# Patient Record
Sex: Female | Born: 1945
Health system: Southern US, Community
[De-identification: ages and names within clinical notes are randomized; demographics above are authoritative.]

## PROBLEM LIST (undated history)

## (undated) DIAGNOSIS — R011 Cardiac murmur, unspecified: Secondary | ICD-10-CM

## (undated) DIAGNOSIS — K635 Polyp of colon: Secondary | ICD-10-CM

## (undated) DIAGNOSIS — I10 Essential (primary) hypertension: Secondary | ICD-10-CM

## (undated) DIAGNOSIS — K5792 Diverticulitis of intestine, part unspecified, without perforation or abscess without bleeding: Secondary | ICD-10-CM

## (undated) DIAGNOSIS — J189 Pneumonia, unspecified organism: Secondary | ICD-10-CM

## (undated) DIAGNOSIS — K5790 Diverticulosis of intestine, part unspecified, without perforation or abscess without bleeding: Secondary | ICD-10-CM

## (undated) DIAGNOSIS — M1990 Unspecified osteoarthritis, unspecified site: Secondary | ICD-10-CM

## (undated) DIAGNOSIS — R7303 Prediabetes: Secondary | ICD-10-CM

## (undated) DIAGNOSIS — M8580 Other specified disorders of bone density and structure, unspecified site: Secondary | ICD-10-CM

## (undated) HISTORY — DX: Other specified disorders of bone density and structure, unspecified site: M85.80

## (undated) HISTORY — DX: Polyp of colon: K63.5

## (undated) HISTORY — DX: Cardiac murmur, unspecified: R01.1

## (undated) HISTORY — PX: COLONOSCOPY: SHX174

## (undated) HISTORY — DX: Prediabetes: R73.03

## (undated) HISTORY — DX: Essential (primary) hypertension: I10

## (undated) HISTORY — DX: Pneumonia, unspecified organism: J18.9

## (undated) HISTORY — DX: Unspecified osteoarthritis, unspecified site: M19.90

## (undated) HISTORY — DX: Diverticulitis of intestine, part unspecified, without perforation or abscess without bleeding: K57.92

## (undated) HISTORY — DX: Diverticulosis of intestine, part unspecified, without perforation or abscess without bleeding: K57.90

---

## 1985-03-22 HISTORY — PX: BREAST ENHANCEMENT SURGERY: SHX7

## 1997-08-12 ENCOUNTER — Ambulatory Visit (HOSPITAL_COMMUNITY): Admission: RE | Admit: 1997-08-12 | Discharge: 1997-08-12 | Payer: Self-pay | Admitting: *Deleted

## 1998-09-03 ENCOUNTER — Other Ambulatory Visit: Admission: RE | Admit: 1998-09-03 | Discharge: 1998-09-03 | Payer: Self-pay | Admitting: *Deleted

## 1999-11-13 ENCOUNTER — Other Ambulatory Visit: Admission: RE | Admit: 1999-11-13 | Discharge: 1999-11-13 | Payer: Self-pay | Admitting: *Deleted

## 2000-02-18 ENCOUNTER — Encounter: Admission: RE | Admit: 2000-02-18 | Discharge: 2000-02-18 | Payer: Self-pay | Admitting: *Deleted

## 2000-03-09 ENCOUNTER — Ambulatory Visit (HOSPITAL_COMMUNITY): Admission: RE | Admit: 2000-03-09 | Discharge: 2000-03-09 | Payer: Self-pay | Admitting: *Deleted

## 2000-12-08 ENCOUNTER — Other Ambulatory Visit: Admission: RE | Admit: 2000-12-08 | Discharge: 2000-12-08 | Payer: Self-pay | Admitting: Internal Medicine

## 2001-02-28 ENCOUNTER — Encounter: Payer: Self-pay | Admitting: Internal Medicine

## 2001-02-28 ENCOUNTER — Encounter: Admission: RE | Admit: 2001-02-28 | Discharge: 2001-02-28 | Payer: Self-pay | Admitting: Internal Medicine

## 2002-02-22 ENCOUNTER — Encounter: Admission: RE | Admit: 2002-02-22 | Discharge: 2002-02-22 | Payer: Self-pay | Admitting: Internal Medicine

## 2002-02-22 ENCOUNTER — Encounter: Payer: Self-pay | Admitting: Internal Medicine

## 2002-12-20 ENCOUNTER — Other Ambulatory Visit: Admission: RE | Admit: 2002-12-20 | Discharge: 2002-12-20 | Payer: Self-pay | Admitting: Internal Medicine

## 2003-01-21 ENCOUNTER — Other Ambulatory Visit: Admission: RE | Admit: 2003-01-21 | Discharge: 2003-01-21 | Payer: Self-pay | Admitting: Obstetrics and Gynecology

## 2003-02-27 ENCOUNTER — Encounter: Admission: RE | Admit: 2003-02-27 | Discharge: 2003-02-27 | Payer: Self-pay | Admitting: Internal Medicine

## 2003-12-25 ENCOUNTER — Other Ambulatory Visit: Admission: RE | Admit: 2003-12-25 | Discharge: 2003-12-25 | Payer: Self-pay | Admitting: Internal Medicine

## 2004-03-03 ENCOUNTER — Encounter: Admission: RE | Admit: 2004-03-03 | Discharge: 2004-03-03 | Payer: Self-pay | Admitting: Internal Medicine

## 2004-12-24 ENCOUNTER — Other Ambulatory Visit: Admission: RE | Admit: 2004-12-24 | Discharge: 2004-12-24 | Payer: Self-pay | Admitting: Internal Medicine

## 2005-03-31 ENCOUNTER — Encounter: Admission: RE | Admit: 2005-03-31 | Discharge: 2005-03-31 | Payer: Self-pay | Admitting: Internal Medicine

## 2006-04-07 ENCOUNTER — Encounter: Admission: RE | Admit: 2006-04-07 | Discharge: 2006-04-07 | Payer: Self-pay | Admitting: Internal Medicine

## 2007-01-04 ENCOUNTER — Other Ambulatory Visit: Admission: RE | Admit: 2007-01-04 | Discharge: 2007-01-04 | Payer: Self-pay | Admitting: Internal Medicine

## 2007-04-12 ENCOUNTER — Encounter: Admission: RE | Admit: 2007-04-12 | Discharge: 2007-04-12 | Payer: Self-pay | Admitting: Internal Medicine

## 2008-05-01 ENCOUNTER — Encounter: Admission: RE | Admit: 2008-05-01 | Discharge: 2008-05-01 | Payer: Self-pay | Admitting: Internal Medicine

## 2008-07-25 ENCOUNTER — Ambulatory Visit (HOSPITAL_COMMUNITY): Admission: RE | Admit: 2008-07-25 | Discharge: 2008-07-25 | Payer: Self-pay | Admitting: Internal Medicine

## 2008-11-18 ENCOUNTER — Ambulatory Visit: Payer: Self-pay | Admitting: Chiropractic Medicine

## 2009-02-10 ENCOUNTER — Encounter (INDEPENDENT_AMBULATORY_CARE_PROVIDER_SITE_OTHER): Payer: Self-pay | Admitting: *Deleted

## 2009-02-10 ENCOUNTER — Other Ambulatory Visit: Admission: RE | Admit: 2009-02-10 | Discharge: 2009-02-10 | Payer: Self-pay | Admitting: Internal Medicine

## 2009-02-25 ENCOUNTER — Encounter (INDEPENDENT_AMBULATORY_CARE_PROVIDER_SITE_OTHER): Payer: Self-pay | Admitting: *Deleted

## 2009-02-26 ENCOUNTER — Ambulatory Visit: Payer: Self-pay | Admitting: Gastroenterology

## 2009-03-12 ENCOUNTER — Ambulatory Visit: Payer: Self-pay | Admitting: Gastroenterology

## 2009-03-18 ENCOUNTER — Encounter: Payer: Self-pay | Admitting: Gastroenterology

## 2009-05-20 ENCOUNTER — Encounter: Admission: RE | Admit: 2009-05-20 | Discharge: 2009-05-20 | Payer: Self-pay | Admitting: Internal Medicine

## 2010-05-04 ENCOUNTER — Other Ambulatory Visit: Payer: Self-pay | Admitting: Internal Medicine

## 2010-05-04 DIAGNOSIS — Z1231 Encounter for screening mammogram for malignant neoplasm of breast: Secondary | ICD-10-CM

## 2010-05-25 ENCOUNTER — Ambulatory Visit: Payer: Self-pay

## 2010-05-26 ENCOUNTER — Ambulatory Visit
Admission: RE | Admit: 2010-05-26 | Discharge: 2010-05-26 | Disposition: A | Discharge status: Home / Self Care | Payer: Managed Care, Other (non HMO) | Source: Ambulatory Visit | Attending: Internal Medicine | Admitting: Internal Medicine

## 2010-05-26 DIAGNOSIS — Z1231 Encounter for screening mammogram for malignant neoplasm of breast: Secondary | ICD-10-CM

## 2010-11-25 ENCOUNTER — Telehealth: Payer: Self-pay | Admitting: *Deleted

## 2010-11-25 DIAGNOSIS — Z0000 Encounter for general adult medical examination without abnormal findings: Secondary | ICD-10-CM

## 2010-11-25 NOTE — Telephone Encounter (Signed)
Received staff msg pt is schedule for CPX 02/26/11 need labs entered. Entered labs in EPIC...11/25/10@4 :33pm/LMB

## 2011-02-23 ENCOUNTER — Other Ambulatory Visit (INDEPENDENT_AMBULATORY_CARE_PROVIDER_SITE_OTHER): Payer: Managed Care, Other (non HMO)

## 2011-02-23 DIAGNOSIS — I10 Essential (primary) hypertension: Secondary | ICD-10-CM

## 2011-02-23 DIAGNOSIS — Z0000 Encounter for general adult medical examination without abnormal findings: Secondary | ICD-10-CM

## 2011-02-23 LAB — HEPATIC FUNCTION PANEL
ALT: 14 U/L (ref 0–35)
AST: 16 U/L (ref 0–37)
Albumin: 3.9 g/dL (ref 3.5–5.2)
Alkaline Phosphatase: 67 U/L (ref 39–117)
Bilirubin, Direct: 0.1 mg/dL (ref 0.0–0.3)
Total Bilirubin: 0.5 mg/dL (ref 0.3–1.2)
Total Protein: 7.2 g/dL (ref 6.0–8.3)

## 2011-02-23 LAB — LIPID PANEL
HDL: 77.1 mg/dL (ref >39.00)
Total CHOL/HDL Ratio: 3
VLDL: 12.6 mg/dL (ref 0.0–40.0)

## 2011-02-23 LAB — TSH: TSH: 0.74 u[IU]/mL (ref 0.35–5.50)

## 2011-02-23 LAB — CBC WITH DIFFERENTIAL/PLATELET
Basophils Relative: 0.6 % (ref 0.0–3.0)
Eosinophils Absolute: 0.1 10*3/uL (ref 0.0–0.7)
Hemoglobin: 13.3 g/dL (ref 12.0–15.0)
Lymphocytes Relative: 42.1 % (ref 12.0–46.0)
MCHC: 34.1 g/dL (ref 30.0–36.0)
MCV: 92.5 fl (ref 78.0–100.0)
Monocytes Absolute: 0.8 10*3/uL (ref 0.1–1.0)
Neutro Abs: 3.2 10*3/uL (ref 1.4–7.7)
RBC: 4.22 Mil/uL (ref 3.87–5.11)

## 2011-02-23 LAB — BASIC METABOLIC PANEL
CO2: 28 mEq/L (ref 19–32)
Chloride: 105 mEq/L (ref 96–112)
Sodium: 139 mEq/L (ref 135–145)

## 2011-02-26 ENCOUNTER — Other Ambulatory Visit: Payer: Managed Care, Other (non HMO)

## 2011-02-26 ENCOUNTER — Ambulatory Visit (INDEPENDENT_AMBULATORY_CARE_PROVIDER_SITE_OTHER): Payer: Medicare Other | Admitting: Internal Medicine

## 2011-02-26 ENCOUNTER — Encounter: Payer: Self-pay | Admitting: Internal Medicine

## 2011-02-26 VITALS — BP 130/82 | HR 66 | Temp 98.4°F | Ht 67.0 in | Wt 156.2 lb

## 2011-02-26 DIAGNOSIS — Z23 Encounter for immunization: Secondary | ICD-10-CM

## 2011-02-26 DIAGNOSIS — Z0000 Encounter for general adult medical examination without abnormal findings: Secondary | ICD-10-CM

## 2011-02-26 DIAGNOSIS — Z136 Encounter for screening for cardiovascular disorders: Secondary | ICD-10-CM

## 2011-02-26 DIAGNOSIS — I10 Essential (primary) hypertension: Secondary | ICD-10-CM

## 2011-02-26 DIAGNOSIS — Z124 Encounter for screening for malignant neoplasm of cervix: Secondary | ICD-10-CM

## 2011-02-26 DIAGNOSIS — Z13820 Encounter for screening for osteoporosis: Secondary | ICD-10-CM

## 2011-02-26 DIAGNOSIS — K635 Polyp of colon: Secondary | ICD-10-CM | POA: Insufficient documentation

## 2011-02-26 DIAGNOSIS — M1990 Unspecified osteoarthritis, unspecified site: Secondary | ICD-10-CM

## 2011-02-26 LAB — URINALYSIS, ROUTINE W REFLEX MICROSCOPIC
Ketones, ur: NEGATIVE
Specific Gravity, Urine: 1.02 (ref 1.000–1.030)
Total Protein, Urine: NEGATIVE
Urine Glucose: NEGATIVE
Urobilinogen, UA: 0.2 (ref 0.0–1.0)

## 2011-02-26 NOTE — Assessment & Plan Note (Signed)
BP Readings from Last 3 Encounters:  02/26/11 130/82   The current medical regimen is effective;  continue present plan and medications.

## 2011-02-26 NOTE — Assessment & Plan Note (Signed)
Right knee osteoarthritis, has seen orthopedics for same in past Uses Voltaren 1-2 times weekly as needed for acute flare Symptoms have been stable over past couple years  The current medical regimen is effective;  continue present plan and medications.

## 2011-02-26 NOTE — Progress Notes (Signed)
Subjective:    Patient ID: Sherry Mitchell, female    DOB: 1945-12-24, 65 y.o.   MRN: [id number]  HPI New patient to me and our practice, here today to establish care  Also here for medicare wellness  Diet: heart healthy  Physical activity: Reasonably active with ADLs, no scheduled aerobic exercise Depression/mood screen: negative Hearing: intact to whispered voice Visual acuity: grossly normal, performs annual eye exam  ADLs: capable Fall risk: none Home safety: good Cognitive evaluation: intact to orientation, naming, recall and repetition EOL planning: adv directives, full code/ I agree  I have personally reviewed and have noted 1. The patient's medical and social history 2. Their use of alcohol, tobacco or illicit drugs 3. Their current medications and supplements 4. The patient's functional ability including ADL's, fall risks, home safety risks and hearing or visual impairment. 5. Diet and physical activities 6. Evidence for depression or mood disorders  also reviewed chronic medical issues:  Hypertension -the patient reports compliance with medication(s) as prescribed. Denies adverse side effects.  Osteoarthritis - primarily affects right knee. Uses Voltaren tablet NSAIDs as needed for same, average 1-2 times per week -no swelling or acute flares. Has seen Dr. Charlett Blake orthopedics in the past for same  Past Medical History  Diagnosis Date  . Osteoarthritis   . Hypertension   . Colon polyps 02/2003, 02/2009    Adenomatous polyps   Family History  Problem Relation Age of Onset  . Cancer Mother     cancer of saliva glands  . Heart disease Other    History  Substance Use Topics  . Smoking status: Never Smoker   . Smokeless tobacco: Not on file  . Alcohol Use: Yes    Review of Systems Constitutional: Negative for fever or weight change.  Respiratory: Negative for cough and shortness of breath.   Cardiovascular: Negative for chest pain or palpitations.    Gastrointestinal: Negative for abdominal pain, no bowel changes.  Musculoskeletal: Negative for gait problem or joint swelling.  Skin: Negative for rash.  Neurological: Negative for dizziness or headache.  No other specific complaints in a complete review of systems (except as listed in HPI above).     Objective:   Physical Exam BP 130/82  Pulse 66  Temp(Src) 98.4 F (36.9 C) (Oral)  Ht 5\' 7"  (1.702 m)  Wt 156 lb 3.2 oz (70.852 kg)  BMI 24.46 kg/m2  SpO2 97% Wt Readings from Last 3 Encounters:  02/26/11 156 lb 3.2 oz (70.852 kg)   Constitutional: She appears well-developed and well-nourished. No distress.  HENT: Head: Normocephalic and atraumatic. Ears: B TMs ok, no erythema or effusion; Nose: Nose normal.  Mouth/Throat: Oropharynx is clear and moist. No oropharyngeal exudate.  Eyes: Conjunctivae and EOM are normal. Pupils are equal, round, and reactive to light. No scleral icterus.  Neck: Normal range of motion. Neck supple. No JVD present. No thyromegaly present.  Cardiovascular: Normal rate, regular rhythm and normal heart sounds.  No murmur heard. No BLE edema. Pulmonary/Chest: Effort normal and breath sounds normal. No respiratory distress. She has no wheezes.  Abdominal: Soft. Bowel sounds are normal. She exhibits no distension. There is no tenderness. no masses Musculoskeletal: Right knee with trace effusion, no warmth. Full range of motion nontender to palpation. Remaining joints with normal range of motion, no joint effusions. No gross deformities Neurological: She is alert and oriented to person, place, and time. No cranial nerve deficit. Coordination normal.  Skin: Skin is warm and dry. No rash noted.  No erythema.  Psychiatric: She has a normal mood and affect. Her behavior is normal. Judgment and thought content normal.   Lab Results  Component Value Date   WBC 7.1 02/23/2011   HGB 13.3 02/23/2011   HCT 39.0 02/23/2011   PLT 293.0 02/23/2011   GLUCOSE 100* 02/23/2011    CHOL 199 02/23/2011   TRIG 63.0 02/23/2011   HDL 77.10 02/23/2011   LDLCALC 109* 02/23/2011   ALT 14 02/23/2011   AST 16 02/23/2011   NA 139 02/23/2011   K 4.3 02/23/2011   CL 105 02/23/2011   CREATININE 0.7 02/23/2011   BUN 16 02/23/2011   CO2 28 02/23/2011   TSH 0.74 02/23/2011   EKG: Normal sinus rhythm at 68 beats per minute. No ischemic or arrhythmic changes     Assessment & Plan:  AWV/"CPX" - v70.0 - Today patient counseled on age appropriate routine health concerns for screening and prevention, each reviewed and up to date or declined. Immunizations reviewed and up to date or declined. Labs/ECG reviewed. Risk factors for depression reviewed and negative. Hearing function and visual acuity are intact. ADLs screened and addressed as needed. Functional ability and level of safety reviewed and appropriate. Education, counseling and referrals performed based on assessed risks today. Patient provided with a copy of personalized plan for preventive services.  Also see problem list. Medications and labs reviewed today.

## 2011-02-26 NOTE — Patient Instructions (Signed)
It was good to see you today. We have reviewed your prior records including labs and tests today Health Maintenance reviewed - tetanus booster given, flu shot given. Will refer for Pap smear and bone density scan. Other immunizations and recommendations are up-to-date  Medications reviewed, no changes at this time. Let us know when refills are needed we'll make referral to gynecologist. Our office will contact you regarding appointment(s) once made. Will schedule for bone density scan here today We will send for records from Dr. Elisabeth Most to incorporate into our electronic files Please schedule followup annually for physical and labs, call sooner if problems.

## 2011-03-01 ENCOUNTER — Ambulatory Visit (INDEPENDENT_AMBULATORY_CARE_PROVIDER_SITE_OTHER)
Admission: RE | Admit: 2011-03-01 | Discharge: 2011-03-01 | Disposition: A | Discharge status: Home / Self Care | Payer: Medicare Other | Source: Ambulatory Visit | Attending: Internal Medicine | Admitting: Internal Medicine

## 2011-03-01 DIAGNOSIS — Z13820 Encounter for screening for osteoporosis: Secondary | ICD-10-CM

## 2011-03-25 ENCOUNTER — Encounter: Payer: Self-pay | Admitting: Internal Medicine

## 2011-03-30 ENCOUNTER — Telehealth: Payer: Self-pay | Admitting: Internal Medicine

## 2011-03-30 MED ORDER — RALOXIFENE HCL 60 MG PO TABS: 60.0000 mg | ORAL_TABLET | Freq: Every day | ORAL | Status: DC

## 2011-03-30 MED ORDER — LISINOPRIL-HYDROCHLOROTHIAZIDE 20-25 MG PO TABS: 1.0000 | ORAL_TABLET | Freq: Every day | ORAL | Status: DC

## 2011-03-30 MED ORDER — DILTIAZEM HCL ER 240 MG PO CP24: 240.0000 mg | ORAL_CAPSULE | Freq: Every day | ORAL | Status: DC

## 2011-03-30 NOTE — Telephone Encounter (Signed)
Per pt need refills on lisinopril 20/25mg --diltiazem 240mg --evista 60 mg--Parm__[facility]  Fax#--[phone] Ext [phone]

## 2011-03-30 NOTE — Telephone Encounter (Signed)
Called # actually [employer]. Fax script to [facility], notified pt rx sent to mail service...03/30/11@11 :43am/LMB

## 2011-05-31 ENCOUNTER — Other Ambulatory Visit: Payer: Self-pay | Admitting: Internal Medicine

## 2011-05-31 DIAGNOSIS — Z1231 Encounter for screening mammogram for malignant neoplasm of breast: Secondary | ICD-10-CM

## 2011-06-11 ENCOUNTER — Ambulatory Visit
Admission: RE | Admit: 2011-06-11 | Discharge: 2011-06-11 | Disposition: A | Discharge status: Home / Self Care | Payer: Medicare Other | Source: Ambulatory Visit | Attending: Internal Medicine | Admitting: Internal Medicine

## 2011-06-11 DIAGNOSIS — Z1231 Encounter for screening mammogram for malignant neoplasm of breast: Secondary | ICD-10-CM

## 2011-10-06 ENCOUNTER — Other Ambulatory Visit: Payer: Self-pay | Admitting: Internal Medicine

## 2011-11-30 ENCOUNTER — Encounter: Payer: Self-pay | Admitting: Internal Medicine

## 2012-02-28 ENCOUNTER — Other Ambulatory Visit (INDEPENDENT_AMBULATORY_CARE_PROVIDER_SITE_OTHER): Payer: Medicare Other

## 2012-02-28 ENCOUNTER — Ambulatory Visit (INDEPENDENT_AMBULATORY_CARE_PROVIDER_SITE_OTHER): Payer: Medicare Other | Admitting: Internal Medicine

## 2012-02-28 ENCOUNTER — Encounter: Payer: Self-pay | Admitting: Internal Medicine

## 2012-02-28 VITALS — BP 120/72 | HR 64 | Temp 98.0°F | Ht 67.0 in | Wt 159.0 lb

## 2012-02-28 DIAGNOSIS — M899 Disorder of bone, unspecified: Secondary | ICD-10-CM

## 2012-02-28 DIAGNOSIS — M8580 Other specified disorders of bone density and structure, unspecified site: Secondary | ICD-10-CM | POA: Insufficient documentation

## 2012-02-28 DIAGNOSIS — Z0000 Encounter for general adult medical examination without abnormal findings: Secondary | ICD-10-CM

## 2012-02-28 DIAGNOSIS — M1990 Unspecified osteoarthritis, unspecified site: Secondary | ICD-10-CM

## 2012-02-28 DIAGNOSIS — M949 Disorder of cartilage, unspecified: Secondary | ICD-10-CM

## 2012-02-28 DIAGNOSIS — I10 Essential (primary) hypertension: Secondary | ICD-10-CM

## 2012-02-28 DIAGNOSIS — Z1331 Encounter for screening for depression: Secondary | ICD-10-CM

## 2012-02-28 LAB — CBC WITH DIFFERENTIAL/PLATELET
Basophils Relative: 0.6 % (ref 0.0–3.0)
Eosinophils Relative: 2.3 % (ref 0.0–5.0)
Lymphocytes Relative: 33.9 % (ref 12.0–46.0)
Neutrophils Relative %: 51.8 % (ref 43.0–77.0)
RBC: 4.5 Mil/uL (ref 3.87–5.11)
WBC: 5.7 10*3/uL (ref 4.5–10.5)

## 2012-02-28 LAB — HEPATIC FUNCTION PANEL
AST: 17 U/L (ref 0–37)
Albumin: 4.1 g/dL (ref 3.5–5.2)
Alkaline Phosphatase: 65 U/L (ref 39–117)
Total Protein: 7.4 g/dL (ref 6.0–8.3)

## 2012-02-28 LAB — LIPID PANEL
Cholesterol: 205 mg/dL — ABNORMAL HIGH (ref 0–200)
Triglycerides: 98 mg/dL (ref 0.0–149.0)
VLDL: 19.6 mg/dL (ref 0.0–40.0)

## 2012-02-28 LAB — URINALYSIS, ROUTINE W REFLEX MICROSCOPIC
Specific Gravity, Urine: 1.02 (ref 1.000–1.030)
Total Protein, Urine: NEGATIVE
Urine Glucose: NEGATIVE

## 2012-02-28 LAB — TSH: TSH: 0.32 u[IU]/mL — ABNORMAL LOW (ref 0.35–5.50)

## 2012-02-28 LAB — BASIC METABOLIC PANEL
Calcium: 9.2 mg/dL (ref 8.4–10.5)
Chloride: 100 mEq/L (ref 96–112)
Creatinine, Ser: 0.7 mg/dL (ref 0.4–1.2)

## 2012-02-28 NOTE — Progress Notes (Signed)
Subjective:    Patient ID: Sherry Mitchell, female    DOB: [birth year], 66 y.o.   MRN: [id number]  HPI  here for annual medicare wellness  Diet: heart healthy  Physical activity: Reasonably active with ADLs, no scheduled aerobic exercise Depression/mood screen: negative Hearing: intact to whispered voice Visual acuity: grossly normal, performs annual eye exam  ADLs: capable Fall risk: none Home safety: good Cognitive evaluation: intact to orientation, naming, recall and repetition EOL planning: adv directives, full code/ I agree  I have personally reviewed and have noted 1. The patient's medical and social history 2. Their use of alcohol, tobacco or illicit drugs 3. Their current medications and supplements 4. The patient's functional ability including ADL's, fall risks, home safety risks and hearing or visual impairment. 5. Diet and physical activities 6. Evidence for depression or mood disorders  also reviewed chronic medical issues:  Hypertension -the patient reports compliance with medication(s) as prescribed. Denies adverse side effects.  Osteoarthritis - primarily affects right knee. Uses Voltaren tablet NSAIDs as needed for same, average 1-2 times per week -no swelling or acute flares. Has seen Dr. Charlett Blake orthopedics in the past for same  Past Medical History  Diagnosis Date  . Osteoarthritis   . Hypertension   . Colon polyps 02/2003, 02/2009    Adenomatous polyps  . Osteopenia     DEXA 02/2011: -1.9   Family History  Problem Relation Age of Onset  . Cancer Mother     cancer of saliva glands  . Heart disease Other    History  Substance Use Topics  . Smoking status: Never Smoker   . Smokeless tobacco: Not on file  . Alcohol Use: Yes    Review of Systems  Constitutional: Negative for fever or weight change.  Respiratory: Negative for cough and shortness of breath.   Cardiovascular: Negative for chest pain or palpitations.  Gastrointestinal: Negative for  abdominal pain, no bowel changes.  Musculoskeletal: Negative for gait problem or joint swelling.  Skin: Negative for rash.  Neurological: Negative for dizziness or headache.  No other specific complaints in a complete review of systems (except as listed in HPI above).     Objective:   Physical Exam  BP 120/72  Pulse 64  Temp 98 F (36.7 C) (Oral)  Ht 5\' 7"  (1.702 m)  Wt 159 lb (72.122 kg)  BMI 24.90 kg/m2  SpO2 98% Wt Readings from Last 3 Encounters:  02/28/12 159 lb (72.122 kg)  02/26/11 156 lb 3.2 oz (70.852 kg)   Constitutional: She appears well-developed and well-nourished. No distress.  HENT: Head: Normocephalic and atraumatic. Ears: B TMs ok, no erythema or effusion; Nose: Nose normal. Mouth/Throat: Oropharynx is clear and moist. No oropharyngeal exudate.  Eyes: Conjunctivae and EOM are normal. Pupils are equal, round, and reactive to light. No scleral icterus.  Neck: Normal range of motion. Neck supple. No JVD present. No thyromegaly present.  Cardiovascular: Normal rate, regular rhythm and normal heart sounds.  No murmur heard. No BLE edema. Pulmonary/Chest: Effort normal and breath sounds normal. No respiratory distress. She has no wheezes.  Abdominal: Soft. Bowel sounds are normal. She exhibits no distension. There is no tenderness. no masses Musculoskeletal: Right knee with trace effusion, no warmth. Full range of motion nontender to palpation. Remaining joints with normal range of motion, no joint effusions. No gross deformities Neurological: She is alert and oriented to person, place, and time. No cranial nerve deficit. Coordination normal.  Skin: Skin is warm and dry. No  rash noted. No erythema.  Psychiatric: She has a normal mood and affect. Her behavior is normal. Judgment and thought content normal.   Lab Results  Component Value Date   WBC 7.1 02/23/2011   HGB 13.3 02/23/2011   HCT 39.0 02/23/2011   PLT 293.0 02/23/2011   GLUCOSE 100* 02/23/2011   CHOL 199  02/23/2011   TRIG 63.0 02/23/2011   HDL 77.10 02/23/2011   LDLCALC 109* 02/23/2011   ALT 14 02/23/2011   AST 16 02/23/2011   NA 139 02/23/2011   K 4.3 02/23/2011   CL 105 02/23/2011   CREATININE 0.7 02/23/2011   BUN 16 02/23/2011   CO2 28 02/23/2011   TSH 0.74 02/23/2011   EKG: Normal sinus rhythm at 61 beats per minute. No ischemic or arrhythmic changes - no change from 02/2011     Assessment & Plan:  AWV/"CPX" - v70.0 - Today patient counseled on age appropriate routine health concerns for screening and prevention, each reviewed and up to date or declined. Immunizations reviewed and up to date or declined. Labs/ECG reviewed. Risk factors for depression reviewed and negative. Hearing function and visual acuity are intact. ADLs screened and addressed as needed. Functional ability and level of safety reviewed and appropriate. Education, counseling and referrals performed based on assessed risks today. Patient provided with a copy of personalized plan for preventive services.  Also see problem list. Medications and labs reviewed today.

## 2012-02-28 NOTE — Assessment & Plan Note (Signed)
BP Readings from Last 3 Encounters:  02/28/12 120/72  02/26/11 130/82   The current medical regimen is effective;  continue present plan and medications.

## 2012-02-28 NOTE — Assessment & Plan Note (Signed)
Right knee osteoarthritis, has seen orthopedics for same in past On joint juice supplement with improved symptoms  Occasionally uses Voltaren 1-2 times weekly as needed for acute flare Symptoms have been stable over past couple years  The current medical regimen is effective;  continue present plan and medications.

## 2012-02-28 NOTE — Assessment & Plan Note (Signed)
Education on dx provided Continue Evista with Ca+Vit D and WB activity as ongoing

## 2012-02-28 NOTE — Patient Instructions (Signed)
It was good to see you today. We have reviewed your prior records including labs and tests today Health Maintenance reviewed - all recommended immunizations and age-appropriate screenings are up-to-date. Test(s) ordered today. Your results will be released to MyChart (or called to you) after review, usually within 72hours after test completion. If any changes need to be made, you will be notified at that same time. Medications reviewed, no changes at this time. Please schedule followup in 1 year, call sooner if problems. Health Maintenance, Females A healthy lifestyle and preventative care can promote health and wellness.  Maintain regular health, dental, and eye exams.   Eat a healthy diet. Foods like vegetables, fruits, whole grains, low-fat dairy products, and lean protein foods contain the nutrients you need without too many calories. Decrease your intake of foods high in solid fats, added sugars, and salt. Get information about a proper diet from your caregiver, if necessary.   Regular physical exercise is one of the most important things you can do for your health. Most adults should get at least 150 minutes of moderate-intensity exercise (any activity that increases your heart rate and causes you to sweat) each week. In addition, most adults need muscle-strengthening exercises on 2 or more days a week.     Maintain a healthy weight. The body mass index (BMI) is a screening tool to identify possible weight problems. It provides an estimate of body fat based on height and weight. Your caregiver can help determine your BMI, and can help you achieve or maintain a healthy weight. For adults 20 years and older:   A BMI below 18.5 is considered underweight.   A BMI of 18.5 to 24.9 is normal.   A BMI of 25 to 29.9 is considered overweight.   A BMI of 30 and above is considered obese.   Maintain normal blood lipids and cholesterol by exercising and minimizing your intake of saturated fat. Eat  a balanced diet with plenty of fruits and vegetables. Blood tests for lipids and cholesterol should begin at age 79 and be repeated every 5 years. If your lipid or cholesterol levels are high, you are over 50, or you are a high risk for heart disease, you may need your cholesterol levels checked more frequently. Ongoing high lipid and cholesterol levels should be treated with medicines if diet and exercise are not effective.   If you smoke, find out from your caregiver how to quit. If you do not use tobacco, do not start.   If you are pregnant, do not drink alcohol. If you are breastfeeding, be very cautious about drinking alcohol. If you are not pregnant and choose to drink alcohol, do not exceed 1 drink per day. One drink is considered to be 12 ounces (355 mL) of beer, 5 ounces (148 mL) of wine, or 1.5 ounces (44 mL) of liquor.   Avoid use of street drugs. Do not share needles with anyone. Ask for help if you need support or instructions about stopping the use of drugs.   High blood pressure causes heart disease and increases the risk of stroke. Blood pressure should be checked at least every 1 to 2 years. Ongoing high blood pressure should be treated with medicines, if weight loss and exercise are not effective.   If you are 75 to 66 years old, ask your caregiver if you should take aspirin to prevent strokes.   Diabetes screening involves taking a blood sample to check your fasting blood sugar level. This should  be done once every 3 years, after age 57, if you are within normal weight and without risk factors for diabetes. Testing should be considered at a younger age or be carried out more frequently if you are overweight and have at least 1 risk factor for diabetes.   Breast cancer screening is essential preventative care for women. You should practice "breast self-awareness." This means understanding the normal appearance and feel of your breasts and may include breast self-examination. Any  changes detected, no matter how small, should be reported to a caregiver. Women in their 81s and 30s should have a clinical breast exam (CBE) by a caregiver as part of a regular health exam every 1 to 3 years. After age 32, women should have a CBE every year. Starting at age 46, women should consider having a mammogram (breast X-ray) every year. Women who have a family history of breast cancer should talk to their caregiver about genetic screening. Women at a high risk of breast cancer should talk to their caregiver about having an MRI and a mammogram every year.   The Pap test is a screening test for cervical cancer. Women should have a Pap test starting at age 65. Between ages 50 and 75, Pap tests should be repeated every 2 years. Beginning at age 71, you should have a Pap test every 3 years as long as the past 3 Pap tests have been normal. If you had a hysterectomy for a problem that was not cancer or a condition that could lead to cancer, then you no longer need Pap tests. If you are between ages 40 and 71, and you have had normal Pap tests going back 10 years, you no longer need Pap tests. If you have had past treatment for cervical cancer or a condition that could lead to cancer, you need Pap tests and screening for cancer for at least 20 years after your treatment. If Pap tests have been discontinued, risk factors (such as a new sexual partner) need to be reassessed to determine if screening should be resumed. Some women have medical problems that increase the chance of getting cervical cancer. In these cases, your caregiver may recommend more frequent screening and Pap tests.   The human papillomavirus (HPV) test is an additional test that may be used for cervical cancer screening. The HPV test looks for the virus that can cause the cell changes on the cervix. The cells collected during the Pap test can be tested for HPV. The HPV test could be used to screen women aged 45 years and older, and should be  used in women of any age who have unclear Pap test results. After the age of 72, women should have HPV testing at the same frequency as a Pap test.   Colorectal cancer can be detected and often prevented. Most routine colorectal cancer screening begins at the age of 37 and continues through age 56. However, your caregiver may recommend screening at an earlier age if you have risk factors for colon cancer. On a yearly basis, your caregiver may provide home test kits to check for hidden blood in the stool. Use of a small camera at the end of a tube, to directly examine the colon (sigmoidoscopy or colonoscopy), can detect the earliest forms of colorectal cancer. Talk to your caregiver about this at age 44, when routine screening begins. Direct examination of the colon should be repeated every 5 to 10 years through age 76, unless early forms of pre-cancerous polyps  or small growths are found.   Hepatitis C blood testing is recommended for all people born from [birth year] through 1965 and any individual with known risks for hepatitis C.   Practice safe sex. Use condoms and avoid high-risk sexual practices to reduce the spread of sexually transmitted infections (STIs). Sexually active women aged 40 and younger should be checked for Chlamydia, which is a common sexually transmitted infection. Older women with new or multiple partners should also be tested for Chlamydia. Testing for other STIs is recommended if you are sexually active and at increased risk.   Osteoporosis is a disease in which the bones lose minerals and strength with aging. This can result in serious bone fractures. The risk of osteoporosis can be identified using a bone density scan. Women ages 57 and over and women at risk for fractures or osteoporosis should discuss screening with their caregivers. Ask your caregiver whether you should be taking a calcium supplement or vitamin D to reduce the rate of osteoporosis.   Menopause can be associated with  physical symptoms and risks. Hormone replacement therapy is available to decrease symptoms and risks. You should talk to your caregiver about whether hormone replacement therapy is right for you.   Use sunscreen with a sun protection factor (SPF) of 30 or greater. Apply sunscreen liberally and repeatedly throughout the day. You should seek shade when your shadow is shorter than you. Protect yourself by wearing long sleeves, pants, a wide-brimmed hat, and sunglasses year round, whenever you are outdoors.   Notify your caregiver of new moles or changes in moles, especially if there is a change in shape or color. Also notify your caregiver if a mole is larger than the size of a pencil eraser.   Stay current with your immunizations.  Document Released: 09/21/2010 Document Revised: 05/31/2011 Document Reviewed: 09/21/2010 [facility] Patient Information 2013 [place], [place].   Osteoporosis and Fractures Osteoporosis is a disease of the bones that makes them weaker and prone to break (fracture). By their mid-30s, most people begin to gradually lose bone strength. If this is severe enough, osteoporosis may occur. Bone fractures from osteoporosis (especially hip and spine fractures) are a major cause of hospitalization, loss of independence, and can lead to life-threatening complications. Your goals are to prevent falls and to prevent or treat weakening bones. ROLE OF FALLS Fractures occur in only a small percentage of older persons who fall, even though at least 30% to 40% of elderly people have osteoporosis. However, a fall of just a few feet may cause a fracture in a person with low bone density. FALL PREVENTION  If you are unsteady on your feet, use a cane, walker, or walk with someone's help.   Remove loose rugs or electrical cords from your home.   Keep your home well lit at night. Use glasses if you need them.   Avoid icy streets and wet or waxed floors.   Hold the railing when using stairs.    Watch out for your pets.   Install grab bars in your bathroom.   Exercise. Physical activity, especially weight-bearing exercise, helps strengthen bones. Strength and balance exercise, such as tai chi, helps prevent falls.   Alcohol and some medicines can make you more likely to fall. Discuss alcohol use with your caregiver. Ask your caregiver if any of your medicines might increase your risk for falling. Ask if safer alternatives are available.  HOME CARE INSTRUCTIONS    Try to prevent and avoid falls.   To  pick up objects, bend at the knees. Do not bend with your back.   Do not smoke. If you smoke, ask for help to stop.   Have adequate calcium and vitamin D in your diet. Talk with your caregiver about amounts.   Before exercising, ask your caregiver what exercises will be good for you.   Only take over-the-counter or prescription medicines for pain, discomfort, or fever as directed by your caregiver.   Some medical conditions (menopause, kidney disease, steroid treatment) can weaken your bones. Talk about bone health with your caregiver. Discuss any medicines you take and whether they can weaken your bones.   If you have osteoporosis or osteopenia, discuss treatment options with your caregiver.  SEEK MEDICAL CARE IF:    You have had a fracture and your pain is not controlled.   You have had a fracture and you are not able to return to activities as expected.   You are reinjured.   You develop side effects from medicines, especially stomach pain or trouble swallowing.   You develop new, unexplained problems.  SEEK IMMEDIATE MEDICAL CARE IF:    You develop sudden, severe pain in your back.   You develop pain after an injury or fall.  Document Released: 01/30/2004 Document Revised: 02/25/2011 Document Reviewed: 07/09/2009 [facility] Patient Information 2012 [place], [place].

## 2012-03-12 ENCOUNTER — Other Ambulatory Visit: Payer: Self-pay | Admitting: Internal Medicine

## 2012-05-24 ENCOUNTER — Other Ambulatory Visit: Payer: Self-pay

## 2012-05-24 DIAGNOSIS — Z1231 Encounter for screening mammogram for malignant neoplasm of breast: Secondary | ICD-10-CM

## 2012-06-20 ENCOUNTER — Ambulatory Visit
Admission: RE | Admit: 2012-06-20 | Discharge: 2012-06-20 | Disposition: A | Discharge status: Home / Self Care | Payer: Medicare Other | Source: Ambulatory Visit

## 2012-06-20 DIAGNOSIS — Z1231 Encounter for screening mammogram for malignant neoplasm of breast: Secondary | ICD-10-CM

## 2013-03-01 ENCOUNTER — Ambulatory Visit (INDEPENDENT_AMBULATORY_CARE_PROVIDER_SITE_OTHER): Payer: Medicare Other | Admitting: Internal Medicine

## 2013-03-01 ENCOUNTER — Encounter: Payer: Self-pay | Admitting: Internal Medicine

## 2013-03-01 ENCOUNTER — Other Ambulatory Visit (INDEPENDENT_AMBULATORY_CARE_PROVIDER_SITE_OTHER): Payer: Medicare Other

## 2013-03-01 VITALS — BP 122/82 | HR 60 | Temp 97.3°F | Ht 67.0 in | Wt 162.0 lb

## 2013-03-01 DIAGNOSIS — I10 Essential (primary) hypertension: Secondary | ICD-10-CM

## 2013-03-01 DIAGNOSIS — Z0000 Encounter for general adult medical examination without abnormal findings: Secondary | ICD-10-CM

## 2013-03-01 DIAGNOSIS — M8580 Other specified disorders of bone density and structure, unspecified site: Secondary | ICD-10-CM

## 2013-03-01 DIAGNOSIS — M899 Disorder of bone, unspecified: Secondary | ICD-10-CM

## 2013-03-01 LAB — HEPATIC FUNCTION PANEL
AST: 16 U/L (ref 0–37)
Albumin: 4.2 g/dL (ref 3.5–5.2)
Alkaline Phosphatase: 70 U/L (ref 39–117)
Total Protein: 7.5 g/dL (ref 6.0–8.3)

## 2013-03-01 LAB — CBC WITH DIFFERENTIAL/PLATELET
Basophils Absolute: 0 10*3/uL (ref 0.0–0.1)
Basophils Relative: 0.8 % (ref 0.0–3.0)
Eosinophils Absolute: 0.1 10*3/uL (ref 0.0–0.7)
Lymphocytes Relative: 39.1 % (ref 12.0–46.0)
MCHC: 34.1 g/dL (ref 30.0–36.0)
Neutrophils Relative %: 46.6 % (ref 43.0–77.0)
Platelets: 288 10*3/uL (ref 150.0–400.0)
RBC: 4.61 Mil/uL (ref 3.87–5.11)
RDW: 14.4 % (ref 11.5–14.6)

## 2013-03-01 LAB — BASIC METABOLIC PANEL
BUN: 14 mg/dL (ref 6–23)
CO2: 28 mEq/L (ref 19–32)
Calcium: 9.1 mg/dL (ref 8.4–10.5)
Creatinine, Ser: 0.6 mg/dL (ref 0.4–1.2)

## 2013-03-01 LAB — URINALYSIS, ROUTINE W REFLEX MICROSCOPIC
Leukocytes, UA: NEGATIVE
Nitrite: NEGATIVE
RBC / HPF: NONE SEEN (ref 0–?)
Specific Gravity, Urine: 1.015 (ref 1.000–1.030)
Urobilinogen, UA: 0.2 (ref 0.0–1.0)
WBC, UA: NONE SEEN (ref 0–?)

## 2013-03-01 LAB — LIPID PANEL
Cholesterol: 206 mg/dL — ABNORMAL HIGH (ref 0–200)
Total CHOL/HDL Ratio: 3

## 2013-03-01 LAB — TSH: TSH: 1.14 u[IU]/mL (ref 0.35–5.50)

## 2013-03-01 NOTE — Progress Notes (Signed)
Pre-visit discussion using our clinic review tool. No additional management support is needed unless otherwise documented below in the visit note.  

## 2013-03-01 NOTE — Progress Notes (Signed)
Subjective:    Patient ID: Sherry Mitchell, female    DOB: 1945/12/01, 67 y.o.   MRN: [id number]  HPI   here for annual medicare wellness  Diet: heart healthy  Physical activity: Reasonably active with ADLs, no scheduled aerobic exercise Depression/mood screen: negative Hearing: intact to whispered voice Visual acuity: grossly normal, performs annual eye exam  ADLs: capable Fall risk: none Home safety: good Cognitive evaluation: intact to orientation, naming, recall and repetition EOL planning: adv directives, full code/ I agree  I have personally reviewed and have noted 1. The patient's medical and social history 2. Their use of alcohol, tobacco or illicit drugs 3. Their current medications and supplements 4. The patient's functional ability including ADL's, fall risks, home safety risks and hearing or visual impairment. 5. Diet and physical activities 6. Evidence for depression or mood disorders  also reviewed chronic medical issues:  Hypertension -the patient reports compliance with medication(s) as prescribed. Denies adverse side effects.  Osteoarthritis - primarily affects right knee. Uses Voltaren tablet NSAIDs as needed for same, average 1-2 times per week -no swelling or acute flares. Has seen Dr. Charlett Blake orthopedics in the past for same  Osteopenia - on Evista for same - no hx fx, no falls. WB activity and regular Vit D + Ca intake - due for DEXA  Past Medical History  Diagnosis Date  . Osteoarthritis   . Hypertension   . Colon polyps 02/2003, 02/2009    Adenomatous polyps  . Osteopenia     DEXA 02/2011: -1.9   Family History  Problem Relation Age of Onset  . Cancer Mother     cancer of saliva glands  . Heart disease Other    History  Substance Use Topics  . Smoking status: Never Smoker   . Smokeless tobacco: Not on file  . Alcohol Use: Yes    Review of Systems  Constitutional: Negative for fatigue and unexpected weight change.  Respiratory:  Negative for cough, shortness of breath and wheezing.   Cardiovascular: Negative for chest pain, palpitations and leg swelling.  Gastrointestinal: Negative for nausea, abdominal pain and diarrhea.  Neurological: Negative for dizziness, weakness, light-headedness and headaches.  Psychiatric/Behavioral: Negative for dysphoric mood. The patient is not nervous/anxious.   All other systems reviewed and are negative.        Objective:   Physical Exam BP 122/82  Pulse 60  Temp(Src) 97.3 F (36.3 C) (Oral)  Ht 5\' 7"  (1.702 m)  Wt 162 lb (73.483 kg)  BMI 25.37 kg/m2  SpO2 95% Wt Readings from Last 3 Encounters:  03/01/13 162 lb (73.483 kg)  02/28/12 159 lb (72.122 kg)  02/26/11 156 lb 3.2 oz (70.852 kg)   Constitutional: She appears well-developed and well-nourished. No distress.  HENT: Head: Normocephalic and atraumatic. Ears: B TMs ok, no erythema or effusion; Nose: Nose normal. Mouth/Throat: Oropharynx is clear and moist. No oropharyngeal exudate.  Eyes: Conjunctivae and EOM are normal. Pupils are equal, round, and reactive to light. No scleral icterus.  Neck: Normal range of motion. Neck supple. No JVD present. No thyromegaly present.  Cardiovascular: Normal rate, regular rhythm and normal heart sounds.  No murmur heard. No BLE edema. Pulmonary/Chest: Effort normal and breath sounds normal. No respiratory distress. She has no wheezes.  Abdominal: Soft. Bowel sounds are normal. She exhibits no distension. There is no tenderness. no masses Musculoskeletal:R 3rd PIP with chronic bony enlargement consistent with post traumatic OA. Tiggering action with flexion/exetension but nontender to palpation. Remaining joints without  joint effusions or gross deformities Neurological: She is alert and oriented to person, place, and time. No cranial nerve deficit. Coordination normal.  Skin: Skin is warm and dry. No rash noted. No erythema.  Psychiatric: She has a normal mood and affect. Her behavior  is normal. Judgment and thought content normal.   Lab Results  Component Value Date   WBC 5.7 02/28/2012   HGB 13.7 02/28/2012   HCT 41.4 02/28/2012   PLT 285.0 02/28/2012   GLUCOSE 111* 02/28/2012   CHOL 205* 02/28/2012   TRIG 98.0 02/28/2012   HDL 61.80 02/28/2012   LDLDIRECT 133.4 02/28/2012   LDLCALC 109* 02/23/2011   ALT 15 02/28/2012   AST 17 02/28/2012   NA 136 02/28/2012   K 4.2 02/28/2012   CL 100 02/28/2012   CREATININE 0.7 02/28/2012   BUN 13 02/28/2012   CO2 29 02/28/2012   TSH 0.32* 02/28/2012        Assessment & Plan:  AWV/"CPX" - v70.0 - Today patient counseled on age appropriate routine health concerns for screening and prevention, each reviewed and up to date or declined. Immunizations reviewed and up to date or declined. Labs reviewed. Risk factors for depression reviewed and negative. Hearing function and visual acuity are intact. ADLs screened and addressed as needed. Functional ability and level of safety reviewed and appropriate. Education, counseling and referrals performed based on assessed risks today. Patient provided with a copy of personalized plan for preventive services.  Also see problem list. Medications and labs reviewed today.

## 2013-03-01 NOTE — Assessment & Plan Note (Signed)
Education on dx provided Check every 43mo Continue Evista with Ca+Vit D and WB activity as ongoing

## 2013-03-01 NOTE — Patient Instructions (Addendum)
It was good to see you today.  We have reviewed your prior records including labs and tests today  Health Maintenance reviewed - all recommended immunizations and age-appropriate screenings are up-to-date.  Test(s) ordered today. Your results will be released to MyChart (or called to you) after review, usually within 72hours after test completion. If any changes need to be made, you will be notified at that same time.  Will also schedule bone density test today  Medications reviewed and updated, no changes recommended at this time.  Please schedule followup in 12 months, call sooner if problems.  Health Maintenance, Female A healthy lifestyle and preventative care can promote health and wellness.  Maintain regular health, dental, and eye exams.  Eat a healthy diet. Foods like vegetables, fruits, whole grains, low-fat dairy products, and lean protein foods contain the nutrients you need without too many calories. Decrease your intake of foods high in solid fats, added sugars, and salt. Get information about a proper diet from your caregiver, if necessary.  Regular physical exercise is one of the most important things you can do for your health. Most adults should get at least 150 minutes of moderate-intensity exercise (any activity that increases your heart rate and causes you to sweat) each week. In addition, most adults need muscle-strengthening exercises on 2 or more days a week.   Maintain a healthy weight. The body mass index (BMI) is a screening tool to identify possible weight problems. It provides an estimate of body fat based on height and weight. Your caregiver can help determine your BMI, and can help you achieve or maintain a healthy weight. For adults 20 years and older:  A BMI below 18.5 is considered underweight.  A BMI of 18.5 to 24.9 is normal.  A BMI of 25 to 29.9 is considered overweight.  A BMI of 30 and above is considered obese.  Maintain normal blood lipids and  cholesterol by exercising and minimizing your intake of saturated fat. Eat a balanced diet with plenty of fruits and vegetables. Blood tests for lipids and cholesterol should begin at age 25 and be repeated every 5 years. If your lipid or cholesterol levels are high, you are over 50, or you are a high risk for heart disease, you may need your cholesterol levels checked more frequently.Ongoing high lipid and cholesterol levels should be treated with medicines if diet and exercise are not effective.  If you smoke, find out from your caregiver how to quit. If you do not use tobacco, do not start.  Lung cancer screening is recommended for adults aged 10 80 years who are at high risk for developing lung cancer because of a history of smoking. Yearly low-dose computed tomography (CT) is recommended for people who have at least a 30-pack-year history of smoking and are a current smoker or have quit within the past 15 years. A pack year of smoking is smoking an average of 1 pack of cigarettes a day for 1 year (for example: 1 pack a day for 30 years or 2 packs a day for 15 years). Yearly screening should continue until the smoker has stopped smoking for at least 15 years. Yearly screening should also be stopped for people who develop a health problem that would prevent them from having lung cancer treatment.  If you are pregnant, do not drink alcohol. If you are breastfeeding, be very cautious about drinking alcohol. If you are not pregnant and choose to drink alcohol, do not exceed 1 drink per  day. One drink is considered to be 12 ounces (355 mL) of beer, 5 ounces (148 mL) of wine, or 1.5 ounces (44 mL) of liquor.  Avoid use of street drugs. Do not share needles with anyone. Ask for help if you need support or instructions about stopping the use of drugs.  High blood pressure causes heart disease and increases the risk of stroke. Blood pressure should be checked at least every 1 to 2 years. Ongoing high blood  pressure should be treated with medicines, if weight loss and exercise are not effective.  If you are 13 to 67 years old, ask your caregiver if you should take aspirin to prevent strokes.  Diabetes screening involves taking a blood sample to check your fasting blood sugar level. This should be done once every 3 years, after age 8, if you are within normal weight and without risk factors for diabetes. Testing should be considered at a younger age or be carried out more frequently if you are overweight and have at least 1 risk factor for diabetes.  Breast cancer screening is essential preventative care for women. You should practice "breast self-awareness." This means understanding the normal appearance and feel of your breasts and may include breast self-examination. Any changes detected, no matter how small, should be reported to a caregiver. Women in their 29s and 30s should have a clinical breast exam (CBE) by a caregiver as part of a regular health exam every 1 to 3 years. After age 80, women should have a CBE every year. Starting at age 54, women should consider having a mammogram (breast X-ray) every year. Women who have a family history of breast cancer should talk to their caregiver about genetic screening. Women at a high risk of breast cancer should talk to their caregiver about having an MRI and a mammogram every year.  Breast cancer gene (BRCA)-related cancer risk assessment is recommended for women who have family members with BRCA-related cancers. BRCA-related cancers include breast, ovarian, tubal, and peritoneal cancers. Having family members with these cancers may be associated with an increased risk for harmful changes (mutations) in the breast cancer genes BRCA1 and BRCA2. Results of the assessment will determine the need for genetic counseling and BRCA1 and BRCA2 testing.  The Pap test is a screening test for cervical cancer. Women should have a Pap test starting at age 79. Between ages  33 and 71, Pap tests should be repeated every 2 years. Beginning at age 46, you should have a Pap test every 3 years as long as the past 3 Pap tests have been normal. If you had a hysterectomy for a problem that was not cancer or a condition that could lead to cancer, then you no longer need Pap tests. If you are between ages 41 and 67, and you have had normal Pap tests going back 10 years, you no longer need Pap tests. If you have had past treatment for cervical cancer or a condition that could lead to cancer, you need Pap tests and screening for cancer for at least 20 years after your treatment. If Pap tests have been discontinued, risk factors (such as a new sexual partner) need to be reassessed to determine if screening should be resumed. Some women have medical problems that increase the chance of getting cervical cancer. In these cases, your caregiver may recommend more frequent screening and Pap tests.  The human papillomavirus (HPV) test is an additional test that may be used for cervical cancer screening. The HPV  test looks for the virus that can cause the cell changes on the cervix. The cells collected during the Pap test can be tested for HPV. The HPV test could be used to screen women aged 84 years and older, and should be used in women of any age who have unclear Pap test results. After the age of 44, women should have HPV testing at the same frequency as a Pap test.  Colorectal cancer can be detected and often prevented. Most routine colorectal cancer screening begins at the age of 64 and continues through age 57. However, your caregiver may recommend screening at an earlier age if you have risk factors for colon cancer. On a yearly basis, your caregiver may provide home test kits to check for hidden blood in the stool. Use of a small camera at the end of a tube, to directly examine the colon (sigmoidoscopy or colonoscopy), can detect the earliest forms of colorectal cancer. Talk to your caregiver  about this at age 34, when routine screening begins. Direct examination of the colon should be repeated every 5 to 10 years through age 6, unless early forms of pre-cancerous polyps or small growths are found.  Hepatitis C blood testing is recommended for all people born from [birth year] through 1965 and any individual with known risks for hepatitis C.  Practice safe sex. Use condoms and avoid high-risk sexual practices to reduce the spread of sexually transmitted infections (STIs). Sexually active women aged 69 and younger should be checked for Chlamydia, which is a common sexually transmitted infection. Older women with new or multiple partners should also be tested for Chlamydia. Testing for other STIs is recommended if you are sexually active and at increased risk.  Osteoporosis is a disease in which the bones lose minerals and strength with aging. This can result in serious bone fractures. The risk of osteoporosis can be identified using a bone density scan. Women ages 64 and over and women at risk for fractures or osteoporosis should discuss screening with their caregivers. Ask your caregiver whether you should be taking a calcium supplement or vitamin D to reduce the rate of osteoporosis.  Menopause can be associated with physical symptoms and risks. Hormone replacement therapy is available to decrease symptoms and risks. You should talk to your caregiver about whether hormone replacement therapy is right for you.  Use sunscreen. Apply sunscreen liberally and repeatedly throughout the day. You should seek shade when your shadow is shorter than you. Protect yourself by wearing long sleeves, pants, a wide-brimmed hat, and sunglasses year round, whenever you are outdoors.  Notify your caregiver of new moles or changes in moles, especially if there is a change in shape or color. Also notify your caregiver if a mole is larger than the size of a pencil eraser.  Stay current with your  immunizations. Document Released: 09/21/2010 Document Revised: 07/03/2012 Document Reviewed: 09/21/2010 [facility] Patient Information 2014 [place], [place].

## 2013-03-01 NOTE — Assessment & Plan Note (Signed)
BP Readings from Last 3 Encounters:  03/01/13 122/82  02/28/12 120/72  02/26/11 130/82   The current medical regimen is effective;  continue present plan and medications.

## 2013-03-02 ENCOUNTER — Ambulatory Visit (INDEPENDENT_AMBULATORY_CARE_PROVIDER_SITE_OTHER)
Admission: RE | Admit: 2013-03-02 | Discharge: 2013-03-02 | Disposition: A | Discharge status: Home / Self Care | Payer: Medicare Other | Source: Ambulatory Visit | Attending: Internal Medicine | Admitting: Internal Medicine

## 2013-03-02 DIAGNOSIS — M899 Disorder of bone, unspecified: Secondary | ICD-10-CM

## 2013-03-02 DIAGNOSIS — M8580 Other specified disorders of bone density and structure, unspecified site: Secondary | ICD-10-CM

## 2013-03-05 ENCOUNTER — Encounter: Payer: Self-pay | Admitting: Internal Medicine

## 2013-05-24 ENCOUNTER — Other Ambulatory Visit: Payer: Self-pay | Admitting: *Deleted

## 2013-05-24 ENCOUNTER — Telehealth: Payer: Self-pay | Admitting: *Deleted

## 2013-05-24 MED ORDER — DILTIAZEM HCL ER 240 MG PO CP24: ORAL_CAPSULE | ORAL | Status: DC

## 2013-05-24 MED ORDER — RALOXIFENE HCL 60 MG PO TABS: ORAL_TABLET | ORAL | Status: DC

## 2013-05-24 MED ORDER — LISINOPRIL-HYDROCHLOROTHIAZIDE 20-25 MG PO TABS: ORAL_TABLET | ORAL | Status: DC

## 2013-05-24 NOTE — Telephone Encounter (Signed)
Pt called and stated she is changing pharmacy. New pharmacy is [facility]. Need new script sent to [facility]. Inform pt will update pharmacy & send refills on all meds...[name]

## 2013-05-24 NOTE — Telephone Encounter (Signed)
Patient phoned stating she was returning a call to [facility].  CB# [phone]

## 2013-05-24 NOTE — Telephone Encounter (Signed)
Spoken with pt see previous msg...[name]

## 2013-07-11 ENCOUNTER — Other Ambulatory Visit: Payer: Self-pay

## 2013-07-11 DIAGNOSIS — Z1231 Encounter for screening mammogram for malignant neoplasm of breast: Secondary | ICD-10-CM

## 2013-08-08 ENCOUNTER — Ambulatory Visit
Admission: RE | Admit: 2013-08-08 | Discharge: 2013-08-08 | Disposition: A | Discharge status: Home / Self Care | Payer: Medicare HMO | Source: Ambulatory Visit

## 2013-08-08 DIAGNOSIS — Z1231 Encounter for screening mammogram for malignant neoplasm of breast: Secondary | ICD-10-CM

## 2013-08-11 ENCOUNTER — Emergency Department (HOSPITAL_COMMUNITY)
Admission: EM | Admit: 2013-08-11 | Discharge: 2013-08-11 | Disposition: A | Discharge status: Home / Self Care | Payer: Medicare HMO | Source: Home / Self Care | Attending: Family Medicine | Admitting: Family Medicine

## 2013-08-11 ENCOUNTER — Encounter (HOSPITAL_COMMUNITY): Payer: Self-pay | Admitting: Emergency Medicine

## 2013-08-11 DIAGNOSIS — J020 Streptococcal pharyngitis: Secondary | ICD-10-CM

## 2013-08-11 LAB — POCT RAPID STREP A: Streptococcus, Group A Screen (Direct): NEGATIVE

## 2013-08-11 MED ORDER — AMOXICILLIN 500 MG PO CAPS: 500.0000 mg | ORAL_CAPSULE | Freq: Three times a day (TID) | ORAL | Status: DC

## 2013-08-11 NOTE — ED Provider Notes (Signed)
CSN: [id number]     Arrival date & time 08/11/13  1615 History   First MD Initiated Contact with Patient 08/11/13 1731     Chief Complaint  Patient presents with  . Sore Throat   (Consider location/radiation/quality/duration/timing/severity/associated sxs/prior Treatment) Patient is a 68 y.o. female presenting with pharyngitis. The history is provided by the patient.  Sore Throat This is a new problem. The current episode started yesterday. The problem has been gradually worsening. The symptoms are aggravated by swallowing.    Past Medical History  Diagnosis Date  . Osteoarthritis   . Hypertension   . Colon polyps 02/2003, 02/2009    Adenomatous polyps  . Osteopenia     DEXA 02/2011: -1.9   Past Surgical History  Procedure Laterality Date  . Breast enhancement surgery  1987   Family History  Problem Relation Age of Onset  . Cancer Mother     cancer of saliva glands  . Heart disease Other    History  Substance Use Topics  . Smoking status: Never Smoker   . Smokeless tobacco: Not on file  . Alcohol Use: Yes   OB History   Grav Para Term Preterm Abortions TAB SAB Ect Mult Living                 Review of Systems  Constitutional: Positive for chills.  HENT: Positive for sore throat. Negative for congestion, postnasal drip and rhinorrhea.   Respiratory: Negative for cough.   Gastrointestinal: Negative.   Skin: Negative.     Allergies  Review of patient's allergies indicates no known allergies.  Home Medications   Prior to Admission medications   Medication Sig Start Date End Date Taking? Authorizing Provider  Calcium Carbonate (CALCIUM 600 PO) Take 1,200 mg by mouth daily.      Historical Provider, MD  cholecalciferol (VITAMIN D) 1000 UNITS tablet Take 1,000 Units by mouth daily.      Historical Provider, MD  diltiazem (DILT-XR) 240 MG 24 hr capsule Take 1 capsule by mouth daily 05/24/13   Rowe Clack, MD  Glucosamine HCl 1500 MG TABS Take by mouth as  needed.     Historical Provider, MD  lisinopril-hydrochlorothiazide Reita May) 20-25 MG per tablet Take 1 tablet by mouth daily 05/24/13   Rowe Clack, MD  Multiple Vitamin (MULTIVITAMIN) tablet Take 1 tablet by mouth daily.      Historical Provider, MD  raloxifene (EVISTA) 60 MG tablet Take 1 tablet by mouth daily 05/24/13   Rowe Clack, MD   BP 137/76  Pulse 83  Temp(Src) 100.6 F (38.1 C) (Oral)  Resp 19  SpO2 94% Physical Exam  Nursing note and vitals reviewed. Constitutional: She is oriented to person, place, and time. She appears well-developed and well-nourished.  A scortching sore throat.  HENT:  Mouth/Throat: Oropharyngeal exudate present.  Neck: Normal range of motion. Neck supple.  Lymphadenopathy:    She has cervical adenopathy.  Neurological: She is alert and oriented to person, place, and time.  Skin: Skin is warm and dry.    ED Course  Procedures (including critical care time) Labs Review Labs Reviewed  POCT RAPID STREP A ([facility] CARE ONLY)    Imaging Review No results found.   MDM   1. Strep sore throat        Billy Fischer, MD 08/11/13 [phone]

## 2013-08-11 NOTE — Discharge Instructions (Signed)
Drink lots of fluids, take all of medicine, use lozenges as needed.return if needed °

## 2013-08-14 LAB — CULTURE, GROUP A STREP

## 2013-12-19 ENCOUNTER — Encounter: Payer: Self-pay | Admitting: Gastroenterology

## 2013-12-25 ENCOUNTER — Encounter: Payer: Self-pay | Admitting: Gastroenterology

## 2014-02-19 ENCOUNTER — Ambulatory Visit (AMBULATORY_SURGERY_CENTER): Payer: Self-pay | Admitting: *Deleted

## 2014-02-19 VITALS — Ht 67.0 in | Wt 149.0 lb

## 2014-02-19 DIAGNOSIS — Z86010 Personal history of colonic polyps: Secondary | ICD-10-CM

## 2014-02-19 MED ORDER — MOVIPREP 100 G PO SOLR: ORAL | Status: DC

## 2014-02-19 NOTE — Progress Notes (Signed)
No egg or soy allergy  No anesthesia or intubation problems per pt  No diet medications taken  Registered in EMMI   

## 2014-03-04 ENCOUNTER — Encounter: Payer: Self-pay | Admitting: Internal Medicine

## 2014-03-04 ENCOUNTER — Ambulatory Visit (INDEPENDENT_AMBULATORY_CARE_PROVIDER_SITE_OTHER): Payer: Medicare HMO | Admitting: Internal Medicine

## 2014-03-04 ENCOUNTER — Encounter: Payer: Medicare Other | Admitting: Internal Medicine

## 2014-03-04 ENCOUNTER — Other Ambulatory Visit (INDEPENDENT_AMBULATORY_CARE_PROVIDER_SITE_OTHER): Payer: Medicare HMO

## 2014-03-04 VITALS — BP 140/74 | HR 61 | Temp 98.1°F | Ht 67.0 in | Wt 153.2 lb

## 2014-03-04 DIAGNOSIS — I10 Essential (primary) hypertension: Secondary | ICD-10-CM

## 2014-03-04 DIAGNOSIS — Z23 Encounter for immunization: Secondary | ICD-10-CM

## 2014-03-04 DIAGNOSIS — Z0000 Encounter for general adult medical examination without abnormal findings: Secondary | ICD-10-CM

## 2014-03-04 LAB — CBC WITH DIFFERENTIAL/PLATELET
BASOS ABS: 0.1 10*3/uL (ref 0.0–0.1)
Basophils Relative: 1.4 % (ref 0.0–3.0)
Eosinophils Absolute: 0.2 10*3/uL (ref 0.0–0.7)
Eosinophils Relative: 2.8 % (ref 0.0–5.0)
HCT: 41 % (ref 36.0–46.0)
Hemoglobin: 13.5 g/dL (ref 12.0–15.0)
LYMPHS PCT: 42.5 % (ref 12.0–46.0)
Lymphs Abs: 2.5 10*3/uL (ref 0.7–4.0)
MCHC: 33 g/dL (ref 30.0–36.0)
MCV: 91.9 fl (ref 78.0–100.0)
Monocytes Absolute: 0.8 10*3/uL (ref 0.1–1.0)
Monocytes Relative: 12.8 % — ABNORMAL HIGH (ref 3.0–12.0)
NEUTROS PCT: 40.5 % — AB (ref 43.0–77.0)
Neutro Abs: 2.4 10*3/uL (ref 1.4–7.7)
Platelets: 280 10*3/uL (ref 150.0–400.0)
RBC: 4.46 Mil/uL (ref 3.87–5.11)
RDW: 14.4 % (ref 11.5–15.5)
WBC: 5.9 10*3/uL (ref 4.0–10.5)

## 2014-03-04 LAB — BASIC METABOLIC PANEL
BUN: 14 mg/dL (ref 6–23)
CALCIUM: 9 mg/dL (ref 8.4–10.5)
CO2: 27 meq/L (ref 19–32)
Chloride: 106 mEq/L (ref 96–112)
Creatinine, Ser: 0.6 mg/dL (ref 0.4–1.2)
GFR: 103.49 mL/min (ref >60.00)
GLUCOSE: 99 mg/dL (ref 70–99)
Potassium: 4.4 mEq/L (ref 3.5–5.1)
Sodium: 139 mEq/L (ref 135–145)

## 2014-03-04 LAB — HEPATIC FUNCTION PANEL
ALK PHOS: 64 U/L (ref 39–117)
ALT: 15 U/L (ref 0–35)
AST: 16 U/L (ref 0–37)
Albumin: 3.9 g/dL (ref 3.5–5.2)
Bilirubin, Direct: 0 mg/dL (ref 0.0–0.3)
Total Bilirubin: 0.6 mg/dL (ref 0.2–1.2)
Total Protein: 7 g/dL (ref 6.0–8.3)

## 2014-03-04 LAB — URINALYSIS, ROUTINE W REFLEX MICROSCOPIC
BILIRUBIN URINE: NEGATIVE
Hgb urine dipstick: NEGATIVE
Ketones, ur: NEGATIVE
NITRITE: NEGATIVE
PH: 7.5 (ref 5.0–8.0)
RBC / HPF: NONE SEEN (ref 0–?)
Specific Gravity, Urine: 1.01 (ref 1.000–1.030)
TOTAL PROTEIN, URINE-UPE24: NEGATIVE
Urine Glucose: NEGATIVE
Urobilinogen, UA: 0.2 (ref 0.0–1.0)

## 2014-03-04 LAB — TSH: TSH: 0.88 u[IU]/mL (ref 0.35–4.50)

## 2014-03-04 LAB — LIPID PANEL
Cholesterol: 195 mg/dL (ref 0–200)
HDL: 64.6 mg/dL (ref >39.00)
LDL Cholesterol: 115 mg/dL — ABNORMAL HIGH (ref 0–99)
NonHDL: 130.4
Total CHOL/HDL Ratio: 3
Triglycerides: 75 mg/dL (ref 0.0–149.0)
VLDL: 15 mg/dL (ref 0.0–40.0)

## 2014-03-04 MED ORDER — DILTIAZEM HCL ER 240 MG PO CP24: 240.0000 mg | ORAL_CAPSULE | Freq: Every day | ORAL | Status: DC

## 2014-03-04 MED ORDER — LISINOPRIL-HYDROCHLOROTHIAZIDE 20-25 MG PO TABS: 1.0000 | ORAL_TABLET | Freq: Every day | ORAL | Status: DC

## 2014-03-04 NOTE — Patient Instructions (Addendum)
It was good to see you today.  We have reviewed your prior records including labs and tests today  Prevnar 13 (second pneumonia vaccine) updated today  Other Health Maintenance reviewed - all other recommended immunizations and age-appropriate screenings are up-to-date (or scheduled).  Test(s) ordered today. Your results will be released to [facility] (or called to you) after review, usually within 72hours after test completion. If any changes need to be made, you will be notified at that same time.  Medications reviewed and updated, no changes recommended at this time.  Please schedule followup in 12 months for annual exam and labs, call sooner if problems.  Health Maintenance Adopting a healthy lifestyle and getting preventive care can go a long way to promote health and wellness. Talk with your health care provider about what schedule of regular examinations is right for you. This is a good chance for you to check in with your provider about disease prevention and staying healthy. In between checkups, there are plenty of things you can do on your own. Experts have done a lot of research about which lifestyle changes and preventive measures are most likely to keep you healthy. Ask your health care provider for more information. WEIGHT AND DIET  Eat a healthy diet  Be sure to include plenty of vegetables, fruits, low-fat dairy products, and lean protein.  Do not eat a lot of foods high in solid fats, added sugars, or salt.  Get regular exercise. This is one of the most important things you can do for your health.  Most adults should exercise for at least 150 minutes each week. The exercise should increase your heart rate and make you sweat (moderate-intensity exercise).  Most adults should also do strengthening exercises at least twice a week. This is in addition to the moderate-intensity exercise.  Maintain a healthy weight  Body mass index (BMI) is a measurement that can be used to  identify possible weight problems. It estimates body fat based on height and weight. Your health care provider can help determine your BMI and help you achieve or maintain a healthy weight.  For females 59 years of age and older:   A BMI below 18.5 is considered underweight.  A BMI of 18.5 to 24.9 is normal.  A BMI of 25 to 29.9 is considered overweight.  A BMI of 30 and above is considered obese.  Watch levels of cholesterol and blood lipids  You should start having your blood tested for lipids and cholesterol at 68 years of age, then have this test every 5 years.  You may need to have your cholesterol levels checked more often if:  Your lipid or cholesterol levels are high.  You are older than 68 years of age.  You are at high risk for heart disease.  CANCER SCREENING   Lung Cancer  Lung cancer screening is recommended for adults 45-92 years old who are at high risk for lung cancer because of a history of smoking.  A yearly low-dose CT scan of the lungs is recommended for people who:  Currently smoke.  Have quit within the past 15 years.  Have at least a 30-pack-year history of smoking. A pack year is smoking an average of one pack of cigarettes a day for 1 year.  Yearly screening should continue until it has been 15 years since you quit.  Yearly screening should stop if you develop a health problem that would prevent you from having lung cancer treatment.  Breast Cancer  Practice breast self-awareness. This means understanding how your breasts normally appear and feel.  It also means doing regular breast self-exams. Let your health care provider know about any changes, no matter how small.  If you are in your 20s or 30s, you should have a clinical breast exam (CBE) by a health care provider every 1-3 years as part of a regular health exam.  If you are 31 or older, have a CBE every year. Also consider having a breast X-ray (mammogram) every year.  If you have a  family history of breast cancer, talk to your health care provider about genetic screening.  If you are at high risk for breast cancer, talk to your health care provider about having an MRI and a mammogram every year.  Breast cancer gene (BRCA) assessment is recommended for women who have family members with BRCA-related cancers. BRCA-related cancers include:  Breast.  Ovarian.  Tubal.  Peritoneal cancers.  Results of the assessment will determine the need for genetic counseling and BRCA1 and BRCA2 testing. Cervical Cancer Routine pelvic examinations to screen for cervical cancer are no longer recommended for nonpregnant women who are considered low risk for cancer of the pelvic organs (ovaries, uterus, and vagina) and who do not have symptoms. A pelvic examination may be necessary if you have symptoms including those associated with pelvic infections. Ask your health care provider if a screening pelvic exam is right for you.   The Pap test is the screening test for cervical cancer for women who are considered at risk.  If you had a hysterectomy for a problem that was not cancer or a condition that could lead to cancer, then you no longer need Pap tests.  If you are older than 65 years, and you have had normal Pap tests for the past 10 years, you no longer need to have Pap tests.  If you have had past treatment for cervical cancer or a condition that could lead to cancer, you need Pap tests and screening for cancer for at least 20 years after your treatment.  If you no longer get a Pap test, assess your risk factors if they change (such as having a new sexual partner). This can affect whether you should start being screened again.  Some women have medical problems that increase their chance of getting cervical cancer. If this is the case for you, your health care provider may recommend more frequent screening and Pap tests.  The human papillomavirus (HPV) test is another test that may  be used for cervical cancer screening. The HPV test looks for the virus that can cause cell changes in the cervix. The cells collected during the Pap test can be tested for HPV.  The HPV test can be used to screen women 49 years of age and older. Getting tested for HPV can extend the interval between normal Pap tests from three to five years.  An HPV test also should be used to screen women of any age who have unclear Pap test results.  After 68 years of age, women should have HPV testing as often as Pap tests.  Colorectal Cancer  This type of cancer can be detected and often prevented.  Routine colorectal cancer screening usually begins at 68 years of age and continues through 68 years of age.  Your health care provider may recommend screening at an earlier age if you have risk factors for colon cancer.  Your health care provider may also recommend using home test kits  to check for hidden blood in the stool.  A small camera at the end of a tube can be used to examine your colon directly (sigmoidoscopy or colonoscopy). This is done to check for the earliest forms of colorectal cancer.  Routine screening usually begins at age 6.  Direct examination of the colon should be repeated every 5-10 years through 68 years of age. However, you may need to be screened more often if early forms of precancerous polyps or small growths are found. Skin Cancer  Check your skin from head to toe regularly.  Tell your health care provider about any new moles or changes in moles, especially if there is a change in a mole's shape or color.  Also tell your health care provider if you have a mole that is larger than the size of a pencil eraser.  Always use sunscreen. Apply sunscreen liberally and repeatedly throughout the day.  Protect yourself by wearing long sleeves, pants, a wide-brimmed hat, and sunglasses whenever you are outside. HEART DISEASE, DIABETES, AND HIGH BLOOD PRESSURE   Have your blood  pressure checked at least every 1-2 years. High blood pressure causes heart disease and increases the risk of stroke.  If you are between 27 years and 82 years old, ask your health care provider if you should take aspirin to prevent strokes.  Have regular diabetes screenings. This involves taking a blood sample to check your fasting blood sugar level.  If you are at a normal weight and have a low risk for diabetes, have this test once every three years after 69 years of age.  If you are overweight and have a high risk for diabetes, consider being tested at a younger age or more often. PREVENTING INFECTION  Hepatitis B  If you have a higher risk for hepatitis B, you should be screened for this virus. You are considered at high risk for hepatitis B if:  You were born in a country where hepatitis B is common. Ask your health care provider which countries are considered high risk.  Your parents were born in a high-risk country, and you have not been immunized against hepatitis B (hepatitis B vaccine).  You have HIV or AIDS.  You use needles to inject street drugs.  You live with someone who has hepatitis B.  You have had sex with someone who has hepatitis B.  You get hemodialysis treatment.  You take certain medicines for conditions, including cancer, organ transplantation, and autoimmune conditions. Hepatitis C  Blood testing is recommended for:  Everyone born from [birth year] through 1965.  Anyone with known risk factors for hepatitis C. Sexually transmitted infections (STIs)  You should be screened for sexually transmitted infections (STIs) including gonorrhea and chlamydia if:  You are sexually active and are younger than 68 years of age.  You are older than 68 years of age and your health care provider tells you that you are at risk for this type of infection.  Your sexual activity has changed since you were last screened and you are at an increased risk for chlamydia or  gonorrhea. Ask your health care provider if you are at risk.  If you do not have HIV, but are at risk, it may be recommended that you take a prescription medicine daily to prevent HIV infection. This is called pre-exposure prophylaxis (PrEP). You are considered at risk if:  You are sexually active and do not regularly use condoms or know the HIV status of your partner(s).  You  take drugs by injection.  You are sexually active with a partner who has HIV. Talk with your health care provider about whether you are at high risk of being infected with HIV. If you choose to begin PrEP, you should first be tested for HIV. You should then be tested every 3 months for as long as you are taking PrEP.  PREGNANCY   If you are premenopausal and you may become pregnant, ask your health care provider about preconception counseling.  If you may become pregnant, take 400 to 800 micrograms (mcg) of folic acid every day.  If you want to prevent pregnancy, talk to your health care provider about birth control (contraception). OSTEOPOROSIS AND MENOPAUSE   Osteoporosis is a disease in which the bones lose minerals and strength with aging. This can result in serious bone fractures. Your risk for osteoporosis can be identified using a bone density scan.  If you are 17 years of age or older, or if you are at risk for osteoporosis and fractures, ask your health care provider if you should be screened.  Ask your health care provider whether you should take a calcium or vitamin D supplement to lower your risk for osteoporosis.  Menopause may have certain physical symptoms and risks.  Hormone replacement therapy may reduce some of these symptoms and risks. Talk to your health care provider about whether hormone replacement therapy is right for you.  HOME CARE INSTRUCTIONS   Schedule regular health, dental, and eye exams.  Stay current with your immunizations.   Do not use any tobacco products including  cigarettes, chewing tobacco, or electronic cigarettes.  If you are pregnant, do not drink alcohol.  If you are breastfeeding, limit how much and how often you drink alcohol.  Limit alcohol intake to no more than 1 drink per day for nonpregnant women. One drink equals 12 ounces of beer, 5 ounces of wine, or 1 ounces of hard liquor.  Do not use street drugs.  Do not share needles.  Ask your health care provider for help if you need support or information about quitting drugs.  Tell your health care provider if you often feel depressed.  Tell your health care provider if you have ever been abused or do not feel safe at home. Document Released: 09/21/2010 Document Revised: 07/23/2013 Document Reviewed: 02/07/2013 [facility] Patient Information 2015 [place], [place]. This information is not intended to replace advice given to you by your health care provider. Make sure you discuss any questions you have with your health care provider.

## 2014-03-04 NOTE — Progress Notes (Signed)
Pre visit review using our clinic review tool, if applicable. No additional management support is needed unless otherwise documented below in the visit note. 

## 2014-03-04 NOTE — Progress Notes (Signed)
Subjective:    Patient ID: Sherry Mitchell, female    DOB: [birth year], 68 y.o.   MRN: [id number]  HPI   Here for medicare wellness  Diet: heart healthy  Physical activity: active @ [employer], yoga 4x/wk Depression/mood screen: negative Hearing: intact to whispered voice Visual acuity: grossly normal, performs annual eye exam  ADLs: capable Fall risk: none Home safety: good Cognitive evaluation: intact to orientation, naming, recall and repetition EOL planning: adv directives, full code/ I agree  I have personally reviewed and have noted 1. The patient's medical and social history 2. Their use of alcohol, tobacco or illicit drugs 3. Their current medications and supplements 4. The patient's functional ability including ADL's, fall risks, home safety risks and hearing or visual impairment. 5. Diet and physical activities 6. Evidence for depression or mood disorders   Also reviewed chronic medical issues and interval medical events  Past Medical History  Diagnosis Date  . Hypertension   . Colon polyps 02/2003, 02/2009    Adenomatous polyps  . Osteopenia     DEXA 02/2011, 02/2013: -1.9  . Osteoarthritis     hands   Family History  Problem Relation Age of Onset  . Cancer Mother     cancer of saliva glands  . Heart disease Other   . Colon cancer Neg Hx   . Esophageal cancer Neg Hx   . Stomach cancer Neg Hx   . Rectal cancer Neg Hx    History  Substance Use Topics  . Smoking status: Never Smoker   . Smokeless tobacco: Never Used  . Alcohol Use: Yes     Comment: wine with dinner    Review of Systems  Constitutional: Negative for fatigue and unexpected weight change.  Respiratory: Negative for cough, shortness of breath and wheezing.   Cardiovascular: Negative for chest pain, palpitations and leg swelling.  Gastrointestinal: Negative for nausea, abdominal pain and diarrhea.  Neurological: Negative for dizziness, weakness, light-headedness and headaches.    Psychiatric/Behavioral: Negative for dysphoric mood. The patient is not nervous/anxious.   All other systems reviewed and are negative.      Objective:   Physical Exam  BP 140/74 mmHg  Pulse 61  Temp(Src) 98.1 F (36.7 C) (Oral)  Ht 5\' 7"  (1.702 m)  Wt 153 lb 4 oz (69.514 kg)  BMI 24.00 kg/m2  SpO2 98% Wt Readings from Last 3 Encounters:  03/04/14 153 lb 4 oz (69.514 kg)  02/19/14 149 lb (67.586 kg)  03/01/13 162 lb (73.483 kg)   Constitutional: She appears well-developed and well-nourished. No distress.  HENT: Head: Normocephalic and atraumatic. Ears: B TMs ok, no erythema or effusion; Nose: Nose normal. Mouth/Throat: Oropharynx is clear and moist. No oropharyngeal exudate.  Eyes: Conjunctivae and EOM are normal. Pupils are equal, round, and reactive to light. No scleral icterus.  Neck: Normal range of motion. Neck supple. No JVD present. No thyromegaly present.  Cardiovascular: Normal rate, regular rhythm and normal heart sounds.  No murmur heard. No BLE edema. Pulmonary/Chest: Effort normal and breath sounds normal. No respiratory distress. She has no wheezes.  Abdominal: Soft. Bowel sounds are normal. She exhibits no distension. There is no tenderness. no masses GU/breast: defer to gyn Musculoskeletal: Normal range of motion, no joint effusions. No gross deformities Neurological: She is alert and oriented to person, place, and time. No cranial nerve deficit. Coordination, balance, strength, speech and gait are normal.  Skin: Skin is warm and dry. No rash noted. No erythema.  Psychiatric: She  has a normal mood and affect. Her behavior is normal. Judgment and thought content normal.    Lab Results  Component Value Date   WBC 6.4 03/01/2013   HGB 13.9 03/01/2013   HCT 40.9 03/01/2013   PLT 288.0 03/01/2013   GLUCOSE 101* 03/01/2013   CHOL 206* 03/01/2013   TRIG 79.0 03/01/2013   HDL 70.30 03/01/2013   LDLDIRECT 125.1 03/01/2013   LDLCALC 109* 02/23/2011   ALT 17  03/01/2013   AST 16 03/01/2013   NA 138 03/01/2013   K 4.3 03/01/2013   CL 102 03/01/2013   CREATININE 0.6 03/01/2013   BUN 14 03/01/2013   CO2 28 03/01/2013   TSH 1.14 03/01/2013    No results found.     Assessment & Plan:   CPX/AWV/z00.00 - Today patient counseled on age appropriate routine health concerns for screening and prevention, each reviewed and up to date or declined. Immunizations reviewed and up to date or declined. Labs ordered and reviewed. Risk factors for depression reviewed and negative. Hearing function and visual acuity are intact. ADLs screened and addressed as needed. Functional ability and level of safety reviewed and appropriate. Education, counseling and referrals performed based on assessed risks today. Patient provided with a copy of personalized plan for preventive services.

## 2014-03-05 ENCOUNTER — Encounter: Payer: Self-pay | Admitting: Gastroenterology

## 2014-03-05 ENCOUNTER — Ambulatory Visit (AMBULATORY_SURGERY_CENTER): Payer: Medicare HMO | Admitting: Gastroenterology

## 2014-03-05 VITALS — BP 149/92 | HR 59 | Temp 98.0°F | Resp 26 | Ht 67.0 in | Wt 149.0 lb

## 2014-03-05 DIAGNOSIS — Z86010 Personal history of colonic polyps: Secondary | ICD-10-CM

## 2014-03-05 DIAGNOSIS — D125 Benign neoplasm of sigmoid colon: Secondary | ICD-10-CM

## 2014-03-05 MED ORDER — SODIUM CHLORIDE 0.9 % IV SOLN: 500.0000 mL | INTRAVENOUS | Status: DC

## 2014-03-05 NOTE — Patient Instructions (Signed)

## 2014-03-05 NOTE — Progress Notes (Signed)
  [facility] Post-op Note  Patient: Sherry Mitchell  Procedure(s) Performed: colonoscopy  Patient Location: [facility] - Recovery Area  Anesthesia Type: Deep Sedation/Propofol  Level of Consciousness: awake, oriented and patient cooperative  Airway and Oxygen Therapy: Patient Spontanous Breathing  Post-op Pain: none  Post-op Assessment:  Post-op Vital signs reviewed, Patient's Cardiovascular Status Stable, Respiratory Function Stable, Patent Airway, No signs of Nausea or vomiting and Pain level controlled  Post-op Vital Signs: Reviewed and stable  Complications: No apparent anesthesia complications  [name] 11:05 AM

## 2014-03-05 NOTE — Progress Notes (Signed)
Called to room to assist during endoscopic procedure.  Patient ID and intended procedure confirmed with present staff. Received instructions for my participation in the procedure from the performing physician.  

## 2014-03-05 NOTE — Op Note (Signed)
[facility] [place]  [address] [place], [address]   COLONOSCOPY PROCEDURE REPORT  PATIENT: Sherry Mitchell, Sherry Mitchell  MR#: [id number] BIRTHDATE: December 09, 1945 , 20  yrs. old GENDER: female ENDOSCOPIST: Ladene Artist, MD, [facility] PROCEDURE DATE:  03/05/2014 PROCEDURE:   Colonoscopy with snare polypectomy First Screening Colonoscopy - Avg.  risk and is 50 yrs.  old or older - No.  Prior Negative Screening - Now for repeat screening. N/A  History of Adenoma - Now for follow-up colonoscopy & has been > or = to 3 yrs.  Yes hx of adenoma.  Has been 3 or more years since last colonoscopy.  Polyps Removed Today? Yes. ASA CLASS:   Class II INDICATIONS:surveillance colonoscopy based on a history of adenomatous colonic polyp(s). MEDICATIONS: Monitored anesthesia care and Propofol 200 mg IV DESCRIPTION OF PROCEDURE:   After the risks benefits and alternatives of the procedure were thoroughly explained, informed consent was obtained.  The digital rectal exam revealed no abnormalities of the rectum.   The LB [id number]  endoscope was introduced through the anus and advanced to the cecum, which was identified by both the appendix and ileocecal valve. No adverse events experienced.   The quality of the prep was excellent, using MoviPrep  The instrument was then slowly withdrawn as the colon was fully examined.  COLON FINDINGS: A sessile polyp measuring 5 mm in size was found in the sigmoid colon.  A polypectomy was performed with a cold snare. The resection was complete, the polyp tissue was completely retrieved and sent to histology.   There was moderate diverticulosis noted in the sigmoid colon and descending colon. The examination was otherwise normal.  Retroflexed views revealed internal Grade I hemorrhoids. The time to cecum=2 minutes 54 seconds.  Withdrawal time=10 minutes 03 seconds.  The scope was withdrawn and the procedure completed. COMPLICATIONS: There were no immediate  complications.  ENDOSCOPIC IMPRESSION: 1.   Sessile polyp in the sigmoid colon; polypectomy performed with a cold snare 2.   Moderate diverticulosis in the sigmoid colon and descending colon 3.   Grade l internal hemorrhoids  RECOMMENDATIONS: 1.  Await pathology results 2.  High fiber diet with liberal fluid intake. 3.  Repeat Colonoscopy in 5 years.  eSigned:  Ladene Artist, MD, [facility] 03/05/2014 11:12 AM  [C

## 2014-03-06 ENCOUNTER — Telehealth: Payer: Self-pay

## 2014-03-06 NOTE — Telephone Encounter (Signed)
  Follow up Call-  Call back number 03/05/2014  Post procedure Call Back phone  # [phone]  Permission to leave phone message Yes     Patient questions:  Do you have a fever, pain , or abdominal swelling? No. Pain Score  0 *  Have you tolerated food without any problems? Yes.    Have you been able to return to your normal activities? Yes.    Do you have any questions about your discharge instructions: Diet   No. Medications  No. Follow up visit  No.  Do you have questions or concerns about your Care? No.  Actions: * If pain score is 4 or above: No action needed, pain <4.

## 2014-03-10 ENCOUNTER — Emergency Department (HOSPITAL_COMMUNITY): Payer: Medicare HMO

## 2014-03-10 ENCOUNTER — Emergency Department (HOSPITAL_COMMUNITY)
Admission: EM | Admit: 2014-03-10 | Discharge: 2014-03-10 | Disposition: A | Discharge status: Home / Self Care | Payer: Medicare HMO | Attending: Emergency Medicine | Admitting: Emergency Medicine

## 2014-03-10 ENCOUNTER — Encounter (HOSPITAL_COMMUNITY): Payer: Self-pay | Admitting: *Deleted

## 2014-03-10 DIAGNOSIS — S43004A Unspecified dislocation of right shoulder joint, initial encounter: Secondary | ICD-10-CM

## 2014-03-10 DIAGNOSIS — Y9389 Activity, other specified: Secondary | ICD-10-CM | POA: Diagnosis not present

## 2014-03-10 DIAGNOSIS — W01198A Fall on same level from slipping, tripping and stumbling with subsequent striking against other object, initial encounter: Secondary | ICD-10-CM | POA: Diagnosis not present

## 2014-03-10 DIAGNOSIS — Z79899 Other long term (current) drug therapy: Secondary | ICD-10-CM | POA: Diagnosis not present

## 2014-03-10 DIAGNOSIS — Y998 Other external cause status: Secondary | ICD-10-CM | POA: Insufficient documentation

## 2014-03-10 DIAGNOSIS — S4991XA Unspecified injury of right shoulder and upper arm, initial encounter: Secondary | ICD-10-CM | POA: Diagnosis present

## 2014-03-10 DIAGNOSIS — I10 Essential (primary) hypertension: Secondary | ICD-10-CM | POA: Diagnosis not present

## 2014-03-10 DIAGNOSIS — IMO0001 Reserved for inherently not codable concepts without codable children: Secondary | ICD-10-CM

## 2014-03-10 DIAGNOSIS — Z86010 Personal history of colonic polyps: Secondary | ICD-10-CM | POA: Insufficient documentation

## 2014-03-10 DIAGNOSIS — S43304A Dislocation of unspecified parts of right shoulder girdle, initial encounter: Secondary | ICD-10-CM | POA: Diagnosis not present

## 2014-03-10 DIAGNOSIS — W19XXXA Unspecified fall, initial encounter: Secondary | ICD-10-CM

## 2014-03-10 DIAGNOSIS — Y9289 Other specified places as the place of occurrence of the external cause: Secondary | ICD-10-CM | POA: Insufficient documentation

## 2014-03-10 LAB — BASIC METABOLIC PANEL
Anion gap: 18 — ABNORMAL HIGH (ref 5–15)
BUN: 14 mg/dL (ref 6–23)
CO2: 24 mEq/L (ref 19–32)
CREATININE: 0.54 mg/dL (ref 0.50–1.10)
Calcium: 9.3 mg/dL (ref 8.4–10.5)
Chloride: 97 mEq/L (ref 96–112)
Glucose, Bld: 131 mg/dL — ABNORMAL HIGH (ref 70–99)
Potassium: 3.5 mEq/L — ABNORMAL LOW (ref 3.7–5.3)
Sodium: 139 mEq/L (ref 137–147)

## 2014-03-10 LAB — CBC WITH DIFFERENTIAL/PLATELET
BASOS ABS: 0.1 10*3/uL (ref 0.0–0.1)
BASOS PCT: 1 % (ref 0–1)
EOS ABS: 0.2 10*3/uL (ref 0.0–0.7)
EOS PCT: 2 % (ref 0–5)
HCT: 40.2 % (ref 36.0–46.0)
Hemoglobin: 13.7 g/dL (ref 12.0–15.0)
Lymphocytes Relative: 42 % (ref 12–46)
Lymphs Abs: 3.7 10*3/uL (ref 0.7–4.0)
MCH: 30.6 pg (ref 26.0–34.0)
MCHC: 34.1 g/dL (ref 30.0–36.0)
MCV: 89.7 fL (ref 78.0–100.0)
MONO ABS: 1 10*3/uL (ref 0.1–1.0)
Monocytes Relative: 11 % (ref 3–12)
Neutro Abs: 3.9 10*3/uL (ref 1.7–7.7)
Neutrophils Relative %: 44 % (ref 43–77)
PLATELETS: 271 10*3/uL (ref 150–400)
RBC: 4.48 MIL/uL (ref 3.87–5.11)
RDW: 13.9 % (ref 11.5–15.5)
WBC: 8.9 10*3/uL (ref 4.0–10.5)

## 2014-03-10 MED ORDER — PROPOFOL 10 MG/ML IV BOLUS
INTRAVENOUS | Status: AC | PRN
  Administered 2014-03-10: 30 mg via INTRAVENOUS

## 2014-03-10 MED ORDER — HYDROCODONE-ACETAMINOPHEN 5-325 MG PO TABS: 1.0000 | ORAL_TABLET | ORAL | Status: DC | PRN

## 2014-03-10 MED ORDER — FENTANYL CITRATE 0.05 MG/ML IJ SOLN
100.0000 ug | Freq: Once | INTRAMUSCULAR | Status: AC
  Administered 2014-03-10: 100 ug via INTRAVENOUS
  Filled 2014-03-10: qty 2

## 2014-03-10 MED ORDER — SODIUM CHLORIDE 0.9 % IV BOLUS (SEPSIS)
1000.0000 mL | Freq: Once | INTRAVENOUS | Status: AC
  Administered 2014-03-10: 1000 mL via INTRAVENOUS

## 2014-03-10 MED ORDER — HYDROMORPHONE HCL 1 MG/ML IJ SOLN
1.0000 mg | Freq: Once | INTRAMUSCULAR | Status: AC
  Administered 2014-03-10: 1 mg via INTRAVENOUS
  Filled 2014-03-10: qty 1

## 2014-03-10 MED ORDER — LORAZEPAM 2 MG/ML IJ SOLN
1.0000 mg | Freq: Once | INTRAMUSCULAR | Status: AC
  Administered 2014-03-10: 1 mg via INTRAVENOUS
  Filled 2014-03-10: qty 1

## 2014-03-10 MED ORDER — PROPOFOL 10 MG/ML IV BOLUS
100.0000 mg/kg | Freq: Once | INTRAVENOUS | Status: AC
  Administered 2014-03-10: 100 mg via INTRAVENOUS

## 2014-03-10 MED ORDER — METHOCARBAMOL 500 MG PO TABS: 500.0000 mg | ORAL_TABLET | Freq: Two times a day (BID) | ORAL | Status: DC

## 2014-03-10 MED ORDER — PROPOFOL 10 MG/ML IV BOLUS
INTRAVENOUS | Status: AC
  Administered 2014-03-10: 100 mg via INTRAVENOUS
  Filled 2014-03-10: qty 20

## 2014-03-10 NOTE — ED Notes (Signed)
Pt was ambulated with assistance from staff and denied any dizziness or feeling faint.  Pt was escorted back to pt room at this time.  Pt remains A&O x4 and in NAD.

## 2014-03-10 NOTE — ED Provider Notes (Signed)
Patient care acquired from [facility], PA-C pending post reduction films.   Results for orders placed or performed during the hospital encounter of 03/10/14  CBC with Differential  Result Value Ref Range   WBC 8.9 4.0 - 10.5 K/uL   RBC 4.48 3.87 - 5.11 MIL/uL   Hemoglobin 13.7 12.0 - 15.0 g/dL   HCT 40.2 36.0 - 46.0 %   MCV 89.7 78.0 - 100.0 fL   MCH 30.6 26.0 - 34.0 pg   MCHC 34.1 30.0 - 36.0 g/dL   RDW 13.9 11.5 - 15.5 %   Platelets 271 150 - 400 K/uL   Neutrophils Relative % 44 43 - 77 %   Neutro Abs 3.9 1.7 - 7.7 K/uL   Lymphocytes Relative 42 12 - 46 %   Lymphs Abs 3.7 0.7 - 4.0 K/uL   Monocytes Relative 11 3 - 12 %   Monocytes Absolute 1.0 0.1 - 1.0 K/uL   Eosinophils Relative 2 0 - 5 %   Eosinophils Absolute 0.2 0.0 - 0.7 K/uL   Basophils Relative 1 0 - 1 %   Basophils Absolute 0.1 0.0 - 0.1 K/uL  Basic metabolic panel  Result Value Ref Range   Sodium 139 137 - 147 mEq/L   Potassium 3.5 (L) 3.7 - 5.3 mEq/L   Chloride 97 96 - 112 mEq/L   CO2 24 19 - 32 mEq/L   Glucose, Bld 131 (H) 70 - 99 mg/dL   BUN 14 6 - 23 mg/dL   Creatinine, Ser 0.54 0.50 - 1.10 mg/dL   Calcium 9.3 8.4 - 10.5 mg/dL   GFR calc non Af Amer >90 >90 mL/min   GFR calc Af Amer >90 >90 mL/min   Anion gap 18 (H) 5 - 15   Dg Shoulder Right  03/10/2014   CLINICAL DATA:  [place] off the bucket and dislocated shoulder.  EXAM: RIGHT SHOULDER - 2+ VIEW  COMPARISON:  03/10/2014  FINDINGS: Single view of the right shoulder was obtained. The position of the right humeral head appears anatomic on this single view. Densities in the right lung are probably related to volume loss.  IMPRESSION: The position of the right humeral head appears anatomic and located on this single view.   Electronically Signed   By: Markus Daft M.D.   On: 03/10/2014 16:45   Dg Shoulder Right  03/10/2014   CLINICAL DATA:  Fall, landed on right arm, right shoulder pain  EXAM: RIGHT SHOULDER - 2+ VIEW  COMPARISON:  None.  FINDINGS:  Anterior/inferior shoulder dislocation.  No fracture is seen.  Degenerative changes of the acromioclavicular joint.  Visualized right lung is clear.  IMPRESSION: Anterior/inferior shoulder dislocation.   Electronically Signed   By: Julian Hy M.D.   On: 03/10/2014 14:52    1. Shoulder dislocation, right, initial encounter   2. Fall   3. Dislocation    Patient able to tolerate PO intake after sedation. She is able to ambulate without difficulty. Post reduction film reviewed. Patient is stable at time of discharge   Gari Crown 03/10/14 2011  Evelina Bucy, MD 03/11/14 [phone]

## 2014-03-10 NOTE — ED Notes (Signed)
Pt transported to xray 

## 2014-03-10 NOTE — ED Notes (Signed)
Pt is sitting up in bed and alert at this time.  Pt provided with PO fluids and graham crackers.

## 2014-03-10 NOTE — ED Provider Notes (Signed)
CSN: [id number]     Arrival date & time 03/10/14  1338 History   First MD Initiated Contact with Patient 03/10/14 1400     Chief Complaint  Patient presents with  . Fall  . Arm Injury     (Consider location/radiation/quality/duration/timing/severity/associated sxs/prior Treatment) HPI Comments: Patient is a 68 year old female who presents with right shoulder pain that started suddenly prior to arrival. Patient reports standing on a bucket and trimming branches when the bucket broke and she fell, bracing her fall with her right arm. Patient reports sudden onset of right shoulder pain that does not radiate. Patient is unable to move her right arm due to pain. No alleviating factors. No other injury. No head trauma or LOC.    Past Medical History  Diagnosis Date  . Hypertension   . Colon polyps 02/2003, 02/2009    Adenomatous polyps  . Osteopenia     DEXA 02/2011, 02/2013: -1.9  . Osteoarthritis     hands   Past Surgical History  Procedure Laterality Date  . Breast enhancement surgery  1987  . Colonoscopy     Family History  Problem Relation Age of Onset  . Cancer Mother     cancer of saliva glands  . Heart disease Other   . Colon cancer Neg Hx   . Esophageal cancer Neg Hx   . Stomach cancer Neg Hx   . Rectal cancer Neg Hx    History  Substance Use Topics  . Smoking status: Never Smoker   . Smokeless tobacco: Never Used  . Alcohol Use: Yes     Comment: wine with dinner   OB History    No data available     Review of Systems  Constitutional: Negative for fever, chills and fatigue.  HENT: Negative for trouble swallowing.   Eyes: Negative for visual disturbance.  Respiratory: Negative for shortness of breath.   Cardiovascular: Negative for chest pain and palpitations.  Gastrointestinal: Negative for nausea, vomiting, abdominal pain and diarrhea.  Genitourinary: Negative for dysuria and difficulty urinating.  Musculoskeletal: Positive for arthralgias. Negative for  neck pain.  Skin: Negative for color change.  Neurological: Negative for dizziness and weakness.  Psychiatric/Behavioral: Negative for dysphoric mood.      Allergies  Review of patient's allergies indicates no known allergies.  Home Medications   Prior to Admission medications   Medication Sig Start Date End Date Taking? Authorizing Provider  diltiazem (DILT-XR) 240 MG 24 hr capsule Take 1 capsule (240 mg total) by mouth daily. 03/04/14   Rowe Clack, MD  lisinopril-hydrochlorothiazide (PRINZIDE,ZESTORETIC) 20-25 MG per tablet Take 1 tablet by mouth daily. 03/04/14   Rowe Clack, MD  raloxifene (EVISTA) 60 MG tablet Take 1 tablet by mouth daily 05/24/13   Rowe Clack, MD   BP 103/54 mmHg  Pulse 53  Temp(Src) 97.9 F (36.6 C) (Oral)  Resp 16  Ht 5\' 7"  (1.702 m)  Wt 147 lb (66.679 kg)  BMI 23.02 kg/m2  SpO2 99% Physical Exam  Constitutional: She is oriented to person, place, and time. She appears well-developed and well-nourished. No distress.  HENT:  Head: Normocephalic and atraumatic.  Eyes: Conjunctivae are normal.  Neck: Normal range of motion.  Cardiovascular: Normal rate, regular rhythm and intact distal pulses.  Exam reveals no gallop and no friction rub.   No murmur heard. Strong right radial pulse.   Pulmonary/Chest: Effort normal and breath sounds normal. She has no wheezes. She has no rales. She exhibits  no tenderness.  Abdominal: Soft. She exhibits no distension. There is no tenderness. There is no rebound.  Musculoskeletal:  Obvious deformity of right shoulder. Generalized tenderness to palpation. No ROM of right shoulder due to pain and deformity.   Patient is able to wiggle fingers of right hand.  Neurological: She is alert and oriented to person, place, and time. Coordination normal.  Sensation intact to light touch of right arm. Speech is goal-oriented. Moves limbs without ataxia.   Skin: Skin is warm and dry.  Psychiatric: She has a  normal mood and affect. Her behavior is normal.  Nursing note and vitals reviewed.   ED Course  Reduction of dislocation Date/Time: 03/10/2014 4:12 PM Performed by: Alvina Chou Authorized by: Alvina Chou Consent: Verbal consent obtained. Risks and benefits: risks, benefits and alternatives were discussed Consent given by: patient Patient understanding: patient states understanding of the procedure being performed Patient consent: the patient's understanding of the procedure matches consent given Patient identity confirmed: verbally with patient Time out: Immediately prior to procedure a "time out" was called to verify the correct patient, procedure, equipment, support staff and site/side marked as required. Preparation: Patient was prepped and draped in the usual sterile fashion. Local anesthesia used: no Patient sedated: yes Sedation type: moderate (conscious) sedation Sedatives: propofol Analgesia: morphine and hydromorphone Sedation start date/time: 03/10/2014 3:58 PM Sedation end date/time: 03/10/2014 4:13 PM Vitals: Vital signs were monitored during sedation. Patient tolerance: Patient tolerated the procedure well with no immediate complications   (including critical care time) Labs Review Labs Reviewed  BASIC METABOLIC PANEL - Abnormal; Notable for the following:    Potassium 3.5 (*)    Glucose, Bld 131 (*)    Anion gap 18 (*)    All other components within normal limits  CBC WITH DIFFERENTIAL    SPLINT APPLICATION Date/Time: 4:62 PM Authorized by: Alvina Chou Consent: Verbal consent obtained. Risks and benefits: risks, benefits and alternatives were discussed Consent given by: patient Splint applied by: nurse Location details: right arm Splint type: shoulder immobilizer Supplies used: shoulder immobilizer Post-procedure: The splinted body part was neurovascularly unchanged following the procedure. Patient tolerance: Patient tolerated the  procedure well with no immediate complications.   Imaging Review Dg Shoulder Right  03/10/2014   CLINICAL DATA:  Gold[place] Circle off the bucket and dislocated shoulder.  EXAM: RIGHT SHOULDER - 2+ VIEW  COMPARISON:  03/10/2014  FINDINGS: Single view of the right shoulder was obtained. The position of the right humeral head appears anatomic on this single view. Densities in the right lung are probably related to volume loss.  IMPRESSION: The position of the right humeral head appears anatomic and located on this single view.   Electronically Signed   By: Markus Daft M.D.   On: 03/10/2014 16:45   Dg Shoulder Right  03/10/2014   CLINICAL DATA:  Fall, landed on right arm, right shoulder pain  EXAM: RIGHT SHOULDER - 2+ VIEW  COMPARISON:  None.  FINDINGS: Anterior/inferior shoulder dislocation.  No fracture is seen.  Degenerative changes of the acromioclavicular joint.  Visualized right lung is clear.  IMPRESSION: Anterior/inferior shoulder dislocation.   Electronically Signed   By: Julian Hy M.D.   On: 03/10/2014 14:52     EKG Interpretation None      MDM   Final diagnoses:  Fall  Dislocation  Shoulder dislocation, right, initial encounter    2:05 PM Labs and right shoulder xray pending. Vitals stable and patient afebrile. No neurovascular compromise. No other injury.  Patient's dislocation reduced successfully and patient has a shoulder immobilizer in place. Patient will have pain medication and Orthopedic follow up.    Alvina Chou, PA-C 03/11/14 1108  Evelina Bucy, MD 03/11/14 1556

## 2014-03-10 NOTE — ED Notes (Signed)
Pt reports falling pta and having severe pain to right upper arm, possible deformity noted. +radial pulse.

## 2014-03-10 NOTE — ED Notes (Signed)
Pt is responsive to verbal stimuli and is A&Ox4.  Respirations are even and unlabored.  Pt is in NAD.

## 2014-03-10 NOTE — Discharge Instructions (Signed)
Keep shoulder immobilizer in place until orthopedic follow up. Take Vicodin as needed for pain. Take Robaxin as needed for muscle spasm. You may take these medications together. Refer to attached documents for more information.

## 2014-03-15 ENCOUNTER — Encounter: Payer: Self-pay | Admitting: Gastroenterology

## 2014-03-28 ENCOUNTER — Other Ambulatory Visit: Payer: Self-pay | Admitting: Orthopaedic Surgery

## 2014-03-28 DIAGNOSIS — M25511 Pain in right shoulder: Secondary | ICD-10-CM

## 2014-04-04 ENCOUNTER — Ambulatory Visit
Admission: RE | Admit: 2014-04-04 | Discharge: 2014-04-04 | Disposition: A | Discharge status: Home / Self Care | Payer: Medicare HMO | Source: Ambulatory Visit | Attending: Orthopaedic Surgery | Admitting: Orthopaedic Surgery

## 2014-04-04 DIAGNOSIS — M25511 Pain in right shoulder: Secondary | ICD-10-CM

## 2014-05-06 ENCOUNTER — Ambulatory Visit: Payer: Private Health Insurance - Indemnity | Admitting: Physical Therapy

## 2014-05-08 ENCOUNTER — Encounter: Payer: Self-pay | Admitting: Physical Therapy

## 2014-05-08 ENCOUNTER — Ambulatory Visit: Payer: Medicare HMO | Attending: Orthopaedic Surgery | Admitting: Physical Therapy

## 2014-05-08 DIAGNOSIS — S46011D Strain of muscle(s) and tendon(s) of the rotator cuff of right shoulder, subsequent encounter: Secondary | ICD-10-CM | POA: Insufficient documentation

## 2014-05-08 DIAGNOSIS — W1789XD Other fall from one level to another, subsequent encounter: Secondary | ICD-10-CM | POA: Insufficient documentation

## 2014-05-08 DIAGNOSIS — M8580 Other specified disorders of bone density and structure, unspecified site: Secondary | ICD-10-CM | POA: Diagnosis not present

## 2014-05-08 DIAGNOSIS — M25511 Pain in right shoulder: Secondary | ICD-10-CM | POA: Insufficient documentation

## 2014-05-08 DIAGNOSIS — M19041 Primary osteoarthritis, right hand: Secondary | ICD-10-CM | POA: Insufficient documentation

## 2014-05-08 DIAGNOSIS — M25611 Stiffness of right shoulder, not elsewhere classified: Secondary | ICD-10-CM | POA: Diagnosis not present

## 2014-05-08 DIAGNOSIS — M19042 Primary osteoarthritis, left hand: Secondary | ICD-10-CM | POA: Insufficient documentation

## 2014-05-08 DIAGNOSIS — R29898 Other symptoms and signs involving the musculoskeletal system: Secondary | ICD-10-CM | POA: Diagnosis not present

## 2014-05-08 DIAGNOSIS — I10 Essential (primary) hypertension: Secondary | ICD-10-CM | POA: Insufficient documentation

## 2014-05-08 NOTE — Patient Instructions (Signed)
ROM: Pendulum (Circular)  Let right arm move in circle clockwise, then counterclockwise, by rocking body weight in circular pattern. Circle _10___ times each direction per set. Do _3___ sessions per day.  Pendulum Side to Side  Bend forward 90 at waist, leaning on table for support. Rock body from side to side and let arm swing freely. Repeat _10___ times. Do __3__ sessions per day.  Finger Flexors  Keeping right fingertips straight, press putty toward base of palm. Repeat __20__ times. Do __3__ sessions per day. Activity: Squeeze flour sifter, plastic squeeze bottles, [place] baster, juice from fruit.*  AROM: Elbow Flexion / Extension  With left hand palm up, gently bend elbow as far as possible. Then straighten arm as far as possible. Repeat __10__ times per set. Do __3__ sessions per day.  SHOULDER: Flexion On Table  Place hands on table, elbows straight. Move hips away from body. Press hands down into table. Hold _3__ seconds. _10__ reps per set, __3_ sets per day. Posture - Sitting   Sit upright, head facing forward. Try using a roll to support lower back. Keep shoulders relaxed, and avoid rounded back. Keep hips level with knees. Avoid crossing legs for long periods.  Copyright  VHI. All rights reserved.   Cryotherapy  ICE 20 mins at most, 3 times a day following exercises.   Cryotherapy means treatment with cold. Ice or gel packs can be used to reduce both pain and swelling. Ice is the most helpful within the first 24 to 48 hours after an injury or flare-up from overusing a muscle or joint. Sprains, strains, spasms, burning pain, shooting pain, and aches can all be eased with ice. Ice can also be used when recovering from surgery. Ice is effective, has very few side effects, and is safe for most people to use. PRECAUTIONS  Ice is not a safe treatment option for people with:  Raynaud phenomenon. This is a condition affecting small blood vessels in the extremities.  Exposure to cold may cause your problems to return.  Cold hypersensitivity. There are many forms of cold hypersensitivity, including:  Cold urticaria. Red, itchy hives appear on the skin when the tissues begin to warm after being iced.  Cold erythema. This is a red, itchy rash caused by exposure to cold.  Cold hemoglobinuria. Red blood cells break down when the tissues begin to warm after being iced. The hemoglobin that carry oxygen are passed into the urine because they cannot combine with blood proteins fast enough.  Numbness or altered sensitivity in the area being iced. If you have any of the following conditions, do not use ice until you have discussed cryotherapy with your caregiver:  Heart conditions, such as arrhythmia, angina, or chronic heart disease.  High blood pressure.  Healing wounds or open skin in the area being iced.  Current infections.  Rheumatoid arthritis.  Poor circulation.  Diabetes. Ice slows the blood flow in the region it is applied. This is beneficial when trying to stop inflamed tissues from spreading irritating chemicals to surrounding tissues. However, if you expose your skin to cold temperatures for too long or without the proper protection, you can damage your skin or nerves. Watch for signs of skin damage due to cold. HOME CARE INSTRUCTIONS Follow these tips to use ice and cold packs safely.  Place a dry or damp towel between the ice and skin. A damp towel will cool the skin more quickly, so you may need to shorten the time that the ice is  used.  For a more rapid response, add gentle compression to the ice.  Ice for no more than 20 minutes at a time. The bonier the area you are icing, the less time it will take to get the benefits of ice.  Check your skin after 5 minutes to make sure there are no signs of a poor response to cold or skin damage.  Rest 20 minutes or more between uses.  Once your skin is numb, you can end your treatment. You can  test numbness by very lightly touching your skin. The touch should be so light that you do not see the skin dimple from the pressure of your fingertip. When using ice, most people will feel these normal sensations in this order: cold, burning, aching, and numbness.  Do not use ice on someone who cannot communicate their responses to pain, such as small children or people with dementia. HOW TO MAKE AN ICE PACK Ice packs are the most common way to use ice therapy. Other methods include ice massage, ice baths, and cryosprays. Muscle creams that cause a cold, tingly feeling do not offer the same benefits that ice offers and should not be used as a substitute unless recommended by your caregiver. To make an ice pack, do one of the following:  Place crushed ice or a bag of frozen vegetables in a sealable plastic bag. Squeeze out the excess air. Place this bag inside another plastic bag. Slide the bag into a pillowcase or place a damp towel between your skin and the bag.  Mix 3 parts water with 1 part rubbing alcohol. Freeze the mixture in a sealable plastic bag. When you remove the mixture from the freezer, it will be slushy. Squeeze out the excess air. Place this bag inside another plastic bag. Slide the bag into a pillowcase or place a damp towel between your skin and the bag. SEEK MEDICAL CARE IF:  You develop white spots on your skin. This may give the skin a blotchy (mottled) appearance.  Your skin turns blue or pale.  Your skin becomes waxy or hard.  Your swelling gets worse. MAKE SURE YOU:   Understand these instructions.  Will watch your condition.  Will get help right away if you are not doing well or get worse. Document Released: 11/02/2010 Document Revised: 07/23/2013 Document Reviewed: 11/02/2010 [facility] Patient Information 2015 [place], [place]. This information is not intended to replace advice given to you by your health care provider. Make sure you discuss any questions you have  with your health care provider.  Scapular Retraction (Standing)   With arms at sides, pinch shoulder blades together. Repeat __10__ times per set. Do __1-2__ sets per session. Do ___2_ sessions per day.  http://orth.exer.us/945   Copyright  VHI. All rights reserved.

## 2014-05-08 NOTE — Therapy (Signed)
[place], [place], [address] Phone: [phone]   Fax:  [phone]  Physical Therapy Evaluation  Patient Details  Name: Sherry Mitchell MRN: [id number] Date of Birth: March 25, 1945 Referring Provider:  Mcarthur Rossetti*  Encounter Date: 05/08/2014      PT End of Session - 05/08/14 0930    Visit Number 1   Number of Visits 16   Date for PT Re-Evaluation 07/03/14   PT Start Time 0845   PT Stop Time 0925   PT Time Calculation (min) 40 min   Activity Tolerance Patient tolerated treatment well      Past Medical History  Diagnosis Date  . Hypertension   . Colon polyps 02/2003, 02/2009    Adenomatous polyps  . Osteopenia     DEXA 02/2011, 02/2013: -1.9  . Osteoarthritis     hands    Past Surgical History  Procedure Laterality Date  . Breast enhancement surgery  1987  . Colonoscopy      There were no vitals taken for this visit.  Visit Diagnosis:  Shoulder pain, acute, right - Plan: PT plan of care cert/re-cert  Weakness of right arm - Plan: PT plan of care cert/re-cert  Stiffness of glenohumeral joint, right - Plan: PT plan of care cert/re-cert      Subjective Assessment - 05/08/14 0852    Symptoms Rt shoulder rotator cuff repair on 04/18/14  (complete supraspinatus and large infraspin.) Pt. fell Dec. 20 while standing on a bucket to reach a branch. She has decr AROM, weakness Rt. UE and residual sensory changes in lower arms.  She is unable to use her Rt. UE for any activities currently due to protocol limits.     Pertinent History OA in knees/hands, osteopenia, HTN   Limitations Lifting;House hold activities;Other (comment)  ADLs, not able to drive, sleep   Diagnostic tests XR, MRI   Patient Stated Goals be able to do normal activities   Currently in Pain? No/denies  an awareness in Rt. UE   Pain Score 2   2/10 is avg when it does hurt.    Pain Location Shoulder   Pain  Orientation Right   Pain Type Surgical pain   Pain Onset 1 to 4 weeks ago   Pain Frequency Intermittent   Aggravating Factors  using Rt. UE   Pain Relieving Factors has stopped since initial days of surgery, sleeps in sling, does not take pain meds   Effect of Pain on Daily Activities cannot do housework, ADLs, needs extra time   Multiple Pain Sites No          [facility] PT Assessment - 05/08/14 0900    Assessment   Medical Diagnosis s/p RC repair   Onset Date 04/18/14   Prior Therapy no   Precautions   Precautions Shoulder   Type of Shoulder Precautions protocol   Restrictions   Weight Bearing Restrictions No   Balance Screen   Has the patient fallen in the past 6 months Yes   How many times? 1  injury   Has the patient had a decrease in activity level because of a fear of falling?  No   Is the patient reluctant to leave their home because of a fear of falling?  No   Prior Function   Level of Independence Independent with basic ADLs   Vocation Retired   Observation/Other Assessments   Focus on Therapeutic Outcomes (FOTO)  69   Sensation  Light Touch Impaired by gross assessment  lower arm into 5th digit   Additional Comments sensory in lower arm and pinky   Posture/Postural Control   Posture/Postural Control No significant limitations   PROM   Right Shoulder Flexion 85 Degrees   Right Shoulder ABduction 50 Degrees   Right Shoulder Internal Rotation 70 Degrees   Right Shoulder External Rotation 15 Degrees   Strength   Left Shoulder Flexion 4+/5   Left Shoulder Extension 4+/5   Left Shoulder ABduction 5/5   Left Shoulder Internal Rotation 4/5   Palpation   Palpation min tenderness post cuff (teres minor)   Special Tests    Special Tests --  NT     Rt. Strength NT due to surgery.     Performed pendulums with min correction, scapular retraction and elbow ROM         PT Education - 05/08/14 1315    Education provided Yes   Education Details HEP pendulums,  POC, ICE   Methods Explanation;Demonstration;Handout   Comprehension Verbalized understanding;Returned demonstration          PT Short Term Goals - 05/08/14 1323    PT SHORT TERM GOAL #1   Title Pt will be I with HEP for Rt UE, posture   Time 4   Period Weeks   Status New   PT SHORT TERM GOAL #2   Title Pt will be able to reach overhead to 110 deg with AAROM of LUE    Time 4   Period Weeks   Status New   PT SHORT TERM GOAL #3   Title Pt will be more comfortable with sleep, improved 50% or more, without sling as MD allows.    Time 4   Period Weeks   Status New   PT SHORT TERM GOAL #4   Title Pt will be able to don clothes, ADLs with min pain increase overall   Time 4   Period Weeks   Status New           PT Long Term Goals - 05/08/14 1325    PT LONG TERM GOAL #1   Title Pt will be I with HEP for RT UE and RICE for pain relief.    Time 8   Period Weeks   Status New   PT LONG TERM GOAL #2   Title Pt. will report less than 2/10 pain with most ADLs, reaching.    Time 8   Period Weeks   Status New   PT LONG TERM GOAL #3   Title Pt will be able to lift <10 lbs for home tasks, do dishes without pain increase   Time 8   Period Weeks   Status New   PT LONG TERM GOAL #4   Title Pt will score <40% limited on FOTO to indicate improved functional uise of RT UE   Time 8   Period Weeks   Status New               Plan - 05/08/14 1315    Clinical Impression Statement Patient with decr AROM, strength and functional use of Rt. UE due to post-surgical guidelines (2.5 weeks post) .  Will perform PROM and restore normal scapular mobility before beginning AAROM at approx. 6 weeks.  Please indicate if you agree with this plan.  Pt will be out of the [place] for 2 weeks beginning 05/22/14.     Pt will benefit from skilled therapeutic intervention in order to improve  on the following deficits Decreased range of motion;Impaired flexibility;Impaired sensation;Decreased activity  tolerance;Increased fascial restricitons;Decreased skin integrity;Impaired UE functional use;Pain;Decreased scar mobility;Decreased strength;Decreased mobility   Rehab Potential Excellent   PT Frequency 2x / week   PT Duration 8 weeks   PT Treatment/Interventions ADLs/Self Care Home Management;Moist Heat;Therapeutic activities;Patient/family education;Scar mobilization;Passive range of motion;Therapeutic exercise;Ultrasound;Manual techniques;Dry needling;Neuromuscular re-education;Cryotherapy;Electrical Stimulation;Functional mobility training   PT Next Visit Plan PROM, manual, modailties   PT Home Exercise Plan pendulum, scap squeeze and elbow   Consulted and Agree with Plan of Care Patient          G-Codes - 2014/06/06 1320    Functional Assessment Tool Used FOTO   Functional Limitation Carrying, moving and handling objects   Carrying, Moving and Handling Objects Current Status [phone]) At least 60 percent but less than 80 percent impaired, limited or restricted   Carrying, Moving and Handling Objects Goal Status ([id number]) At least 20 percent but less than 40 percent impaired, limited or restricted       Problem List Patient Active Problem List   Diagnosis Date Noted  . Osteopenia   . Osteoarthritis   . Hypertension   . Colon polyps     PAA,JENNIFER 2014-06-06, 3:19 PM  [facility] [address] [place], [place], [address] Phone: [phone]   Fax:  [phone]

## 2014-05-10 ENCOUNTER — Ambulatory Visit: Payer: Medicare HMO | Admitting: Physical Therapy

## 2014-05-10 DIAGNOSIS — M25511 Pain in right shoulder: Secondary | ICD-10-CM

## 2014-05-10 DIAGNOSIS — R29898 Other symptoms and signs involving the musculoskeletal system: Secondary | ICD-10-CM

## 2014-05-10 DIAGNOSIS — S46011D Strain of muscle(s) and tendon(s) of the rotator cuff of right shoulder, subsequent encounter: Secondary | ICD-10-CM | POA: Diagnosis not present

## 2014-05-10 DIAGNOSIS — M25611 Stiffness of right shoulder, not elsewhere classified: Secondary | ICD-10-CM

## 2014-05-10 NOTE — Therapy (Signed)
[place], [place], [address] Phone: [phone]   Fax:  [phone]  Physical Therapy Treatment  Patient Details  Name: Sherry Mitchell MRN: [id number] Date of Birth: [birth year] Referring Provider:  Rowe Clack, MD  Encounter Date: 05/10/2014      PT End of Session - 05/10/14 1002    Visit Number 2   Number of Visits 16   Date for PT Re-Evaluation 07/03/14   PT Start Time 0930   PT Stop Time 1012   PT Time Calculation (min) 42 min   Activity Tolerance Patient tolerated treatment well   Behavior During Therapy [facility] for tasks assessed/performed      Past Medical History  Diagnosis Date  . Hypertension   . Colon polyps 02/2003, 02/2009    Adenomatous polyps  . Osteopenia     DEXA 02/2011, 02/2013: -1.9  . Osteoarthritis     hands    Past Surgical History  Procedure Laterality Date  . Breast enhancement surgery  1987  . Colonoscopy      There were no vitals taken for this visit.  Visit Diagnosis:  Shoulder pain, acute, right  Weakness of right arm  Stiffness of glenohumeral joint, right      Subjective Assessment - 05/10/14 0934    Symptoms R shoulder feels good; limited pain   Patient Stated Goals be able to do normal activities   Currently in Pain? No/denies  occasionally hurts at night                    [facility] Adult PT Treatment/Exercise - 05/10/14 0937    Elbow Exercises   Elbow Flexion Right;15 reps;Bar weights/barbell   Bar Weights/Barbell (Elbow Flexion) 1 lb   Neck Exercises: Seated   Shoulder Shrugs 15 reps   Shoulder Exercises: Seated   Retraction Both;15 reps   Other Seated Exercises scapular depression x 15   Modalities   Modalities Cryotherapy   Cryotherapy   Number Minutes Cryotherapy 10 Minutes   Cryotherapy Location Shoulder   Type of Cryotherapy Ice pack   Manual Therapy   Manual Therapy Passive ROM   Passive ROM R shoulder flexion;  abdct to 90; ir/er to tolerance with gentle oscillations for pain and ROM                  PT Short Term Goals - 05/10/14 1004    PT SHORT TERM GOAL #1   Title Pt will be I with HEP for Rt UE, posture   Time 4   Period Weeks   Status On-going   PT SHORT TERM GOAL #2   Title Pt will be able to reach overhead to 110 deg with AAROM of LUE    Time 4   Period Weeks   Status On-going   PT SHORT TERM GOAL #3   Title Pt will be more comfortable with sleep, improved 50% or more, without sling as MD allows.    Time 4   Period Weeks   Status On-going   PT SHORT TERM GOAL #4   Title Pt will be able to don clothes, ADLs with min pain increase overall   Time 4   Period Weeks   Status On-going           PT Long Term Goals - 05/10/14 1004    PT LONG TERM GOAL #1   Title Pt will be I with HEP for RT UE and RICE for  pain relief.    Time 8   Period Weeks   Status On-going   PT LONG TERM GOAL #2   Title Pt. will report less than 2/10 pain with most ADLs, reaching.    Time 8   Period Weeks   Status On-going   PT LONG TERM GOAL #3   Title Pt will be able to lift <10 lbs for home tasks, do dishes without pain increase   Time 8   Period Weeks   Status On-going   PT LONG TERM GOAL #4   Title Pt will score <40% limited on FOTO to indicate improved functional uise of RT UE   Time 8   Period Weeks   Status On-going               Plan - 05/10/14 1003    Clinical Impression Statement Pt tolerated PROM without increase in pain today.  Able to perform scapular exercises without difficulty.  Performed elbow flex with 1# in supine without pain.   PT Next Visit Plan PROM, manual, modailties   Consulted and Agree with Plan of Care Patient        Problem List Patient Active Problem List   Diagnosis Date Noted  . Osteopenia   . Osteoarthritis   . Hypertension   . Colon polyps    Laureen Abrahams, PT, DPT 05/10/2014 10:13 AM  [facility] [address] [place], [place], [address] Phone: [phone]   Fax:  [phone]

## 2014-05-14 ENCOUNTER — Ambulatory Visit: Payer: Medicare HMO | Admitting: Physical Therapy

## 2014-05-14 DIAGNOSIS — M25611 Stiffness of right shoulder, not elsewhere classified: Secondary | ICD-10-CM

## 2014-05-14 DIAGNOSIS — S46011D Strain of muscle(s) and tendon(s) of the rotator cuff of right shoulder, subsequent encounter: Secondary | ICD-10-CM | POA: Diagnosis not present

## 2014-05-14 DIAGNOSIS — M25511 Pain in right shoulder: Secondary | ICD-10-CM

## 2014-05-14 NOTE — Therapy (Signed)
[place], [place], [address] Phone: [phone]   Fax:  [phone]  Physical Therapy Treatment  Patient Details  Name: Sherry Mitchell MRN: [id number] Date of Birth: [birth year] Referring Provider:  Rowe Clack, MD  Encounter Date: 05/14/2014      PT End of Session - 05/14/14 1457    Visit Number 3   Number of Visits 16   Date for PT Re-Evaluation 07/03/14   PT Start Time 6962   PT Stop Time 1455   PT Time Calculation (min) 40 min   Activity Tolerance Patient tolerated treatment well;Patient limited by pain      Past Medical History  Diagnosis Date  . Hypertension   . Colon polyps 02/2003, 02/2009    Adenomatous polyps  . Osteopenia     DEXA 02/2011, 02/2013: -1.9  . Osteoarthritis     hands    Past Surgical History  Procedure Laterality Date  . Breast enhancement surgery  1987  . Colonoscopy      There were no vitals taken for this visit.  Visit Diagnosis:  Stiffness of glenohumeral joint, right  Shoulder pain, acute, right      Subjective Assessment - 05/14/14 1416    Symptoms No pain, Not yet driving, no pain at night.  No pain over a week ago.   Currently in Pain? No/denies   Multiple Pain Sites No                    [facility] Adult PT Treatment/Exercise - 05/14/14 1420    Manual Therapy   Manual Therapy Passive ROM;Other (comment)   Passive ROM 85 abd, flexion 95 visuall estimated IR/ER gentle all distraction helpful   Other Manual Therapy soft tissur work to upper quadrant multiple tender areas softened.                  PT Short Term Goals - 05/10/14 1004    PT SHORT TERM GOAL #1   Title Pt will be I with HEP for Rt UE, posture   Time 4   Period Weeks   Status On-going   PT SHORT TERM GOAL #2   Title Pt will be able to reach overhead to 110 deg with AAROM of LUE    Time 4   Period Weeks   Status On-going   PT SHORT TERM GOAL #3   Title Pt will be more comfortable with sleep, improved 50% or more, without sling as MD allows.    Time 4   Period Weeks   Status On-going   PT SHORT TERM GOAL #4   Title Pt will be able to don clothes, ADLs with min pain increase overall   Time 4   Period Weeks   Status On-going           PT Long Term Goals - 05/14/14 1500    PT LONG TERM GOAL #4   Status Unable to assess               Plan - 05/14/14 1458    Clinical Impression Statement all manual gentle Range within protocol and pain free.  also manual soft tissue,  no new goals met   PT Home Exercise Plan pendulum, scap squeeze and elbowcontinue passive within protocol        Problem List Patient Active Problem List   Diagnosis Date Noted  . Osteopenia   . Osteoarthritis   . Hypertension   .  Colon polyps    Melvenia Needles, PTA 05/14/2014 3:01 PM Phone: [phone] Fax: [phone]   Melvenia Needles 05/14/2014, 3:01 PM  [facility] [address] [place], [place], [address] Phone: [phone]   Fax:  [phone]

## 2014-05-20 ENCOUNTER — Ambulatory Visit: Payer: Medicare HMO | Admitting: Physical Therapy

## 2014-05-20 DIAGNOSIS — S46011D Strain of muscle(s) and tendon(s) of the rotator cuff of right shoulder, subsequent encounter: Secondary | ICD-10-CM | POA: Diagnosis not present

## 2014-05-20 DIAGNOSIS — R29898 Other symptoms and signs involving the musculoskeletal system: Secondary | ICD-10-CM

## 2014-05-20 DIAGNOSIS — M25611 Stiffness of right shoulder, not elsewhere classified: Secondary | ICD-10-CM

## 2014-05-20 DIAGNOSIS — M25511 Pain in right shoulder: Secondary | ICD-10-CM

## 2014-05-20 NOTE — Therapy (Signed)
[place], [place], [address] Phone: [phone]   Fax:  [phone]  Physical Therapy Treatment  Patient Details  Name: Sherry Mitchell MRN: [id number] Date of Birth: 1945-07-01 Referring Provider:  Rowe Clack, MD  Encounter Date: 05/20/2014      PT End of Session - 05/20/14 1503    Visit Number 4   Number of Visits 16   Date for PT Re-Evaluation 07/03/14   PT Start Time 3220   PT Stop Time 2542   PT Time Calculation (min) 47 min   Activity Tolerance Patient tolerated treatment well      Past Medical History  Diagnosis Date  . Hypertension   . Colon polyps 02/2003, 02/2009    Adenomatous polyps  . Osteopenia     DEXA 02/2011, 02/2013: -1.9  . Osteoarthritis     hands    Past Surgical History  Procedure Laterality Date  . Breast enhancement surgery  1987  . Colonoscopy      There were no vitals taken for this visit.  Visit Diagnosis:  Stiffness of glenohumeral joint, right  Shoulder pain, acute, right  Weakness of right arm      Subjective Assessment - 05/20/14 1152    Symptoms No pain. Leaving Wed for 2 weeks.  Evonnie Dawes on 06/10/14          [facility] PT Assessment - 05/20/14 1501    PROM   Right Shoulder Flexion 95 Degrees   Right Shoulder ABduction 95 Degrees   Right Shoulder External Rotation 20 Degrees   Palpation   Palpation tight L levator scap and teres minor          [facility] Adult PT Treatment/Exercise - 05/20/14 1245    Elbow Exercises   Other elbow exercises Weightbearing on countertop small range ext    Neck Exercises: Supine   Other Supine Exercise scapular retraction x 10   Other Supine Exercise shoulder shrugs x 10    Manual Therapy   Manual Therapy Passive ROM;Other (comment)  joint distraction, gr I-II inferior glide for flex/abd   Myofascial Release Rt. levator scap stretch with lengthening strokes   Scapular Mobilization --  in sidelying  all planes   Other Manual Therapy soft tissur work to upper quadrant multiple tender areas softened.                PT Education - 05/20/14 1502    Education Details AAROM after MD visit   Methods Explanation;Demonstration   Comprehension Verbalized understanding          PT Short Term Goals - 05/20/14 1506    PT SHORT TERM GOAL #1   Title Pt will be I with HEP for Rt UE, posture   Status Partially Met  met for pendulum ex   PT SHORT TERM GOAL #2   Title Pt will be able to reach overhead to 110 deg with AAROM of LUE    Status On-going   PT SHORT TERM GOAL #3   Title Pt will be more comfortable with sleep, improved 50% or more, without sling as MD allows.    Status On-going   PT SHORT TERM GOAL #4   Title Pt will be able to don clothes, ADLs with min pain increase overall   Status On-going           PT Long Term Goals - 05/20/14 1507    PT LONG TERM GOAL #1   Title Pt  will be I with HEP for RT UE and RICE for pain relief.    Status On-going   PT LONG TERM GOAL #2   Title Pt. will report less than 2/10 pain with most ADLs, reaching.    Status On-going   PT LONG TERM GOAL #3   Title Pt will be able to lift <10 lbs for home tasks, do dishes without pain increase   Status On-going   PT LONG TERM GOAL #4   Title Pt will score <40% limited on FOTO to indicate improved functional uise of RT UE   Status On-going               Plan - 05/20/14 1504    Clinical Impression Statement No new changes, did tolerate increased PROM with min pain.  Will add AAROM after 06/10/14 unless MD specifiies.    PT Next Visit Plan see what MD says, add seated slides or cane, pulleys   PT Home Exercise Plan pendulum, scap squeeze and elbowcontinue passive within protocol   Consulted and Agree with Plan of Care Patient        Problem List Patient Active Problem List   Diagnosis Date Noted  . Osteopenia   . Osteoarthritis   . Hypertension   . Colon polyps      Nalanie Winiecki 05/20/2014, 3:13 PM  [place], [place], [address] Phone: [phone]   Fax:  [phone]

## 2014-05-21 ENCOUNTER — Encounter: Payer: Medicare HMO | Admitting: Physical Therapy

## 2014-06-11 ENCOUNTER — Ambulatory Visit: Payer: Private Health Insurance - Indemnity | Attending: Orthopaedic Surgery | Admitting: Physical Therapy

## 2014-06-11 DIAGNOSIS — M19041 Primary osteoarthritis, right hand: Secondary | ICD-10-CM | POA: Diagnosis not present

## 2014-06-11 DIAGNOSIS — I10 Essential (primary) hypertension: Secondary | ICD-10-CM | POA: Insufficient documentation

## 2014-06-11 DIAGNOSIS — M8580 Other specified disorders of bone density and structure, unspecified site: Secondary | ICD-10-CM | POA: Insufficient documentation

## 2014-06-11 DIAGNOSIS — R29898 Other symptoms and signs involving the musculoskeletal system: Secondary | ICD-10-CM | POA: Diagnosis not present

## 2014-06-11 DIAGNOSIS — M19042 Primary osteoarthritis, left hand: Secondary | ICD-10-CM | POA: Diagnosis not present

## 2014-06-11 DIAGNOSIS — M25611 Stiffness of right shoulder, not elsewhere classified: Secondary | ICD-10-CM | POA: Diagnosis not present

## 2014-06-11 DIAGNOSIS — S46011D Strain of muscle(s) and tendon(s) of the rotator cuff of right shoulder, subsequent encounter: Secondary | ICD-10-CM | POA: Diagnosis present

## 2014-06-11 DIAGNOSIS — M25511 Pain in right shoulder: Secondary | ICD-10-CM | POA: Diagnosis not present

## 2014-06-11 NOTE — Patient Instructions (Addendum)
Closed Chain: Shoulder Abduction / Adduction - on Wall   One hand on wall, step to side and return. Stepping causes shoulder to abduct and adduct. Can try sliding arm up with wall without stepping back as well.  Step __5-10_ times, each side, __2_ times per day.  http://ss.exer.us/267   Copyright  VHI. All rights reserved.  External Rotation (Isometric)   Place back of left fist against door frame, with elbow bent. Press fist against door frame. Hold ____ seconds. Repeat ____ times. Do ____ sessions per day.  http://gt2.exer.us/110   Copyright  VHI. All rights reserved.   Abduction (Isometric)   Resist upward motion to the side with other hand on upper arm. Hold ____ seconds. Relax. Repeat ____ times. Do ____ sessions per day. Activity: Push arm out to side against padded furniture.*  Copyright  VHI. All rights reserved.    Cane Overhead - Supine  Hold cane at thighs with both hands, extend arms straight over head. Hold __10_ seconds. Repeat __10_ times. Do __2_ times per day.

## 2014-06-11 NOTE — Therapy (Signed)
[place], [place], [address] Phone: [phone]   Fax:  [phone]  Physical Therapy Treatment  Patient Details  Name: Sherry Mitchell MRN: [id number] Date of Birth: January 08, 1946 Referring Provider:  Rowe Clack, MD  Encounter Date: 06/11/2014      PT End of Session - 06/11/14 1107    Visit Number 5   Number of Visits 16   Date for PT Re-Evaluation 07/03/14   PT Start Time 1100   PT Stop Time 1155   PT Time Calculation (min) 55 min   Activity Tolerance Patient tolerated treatment well      Past Medical History  Diagnosis Date  . Hypertension   . Colon polyps 02/2003, 02/2009    Adenomatous polyps  . Osteopenia     DEXA 02/2011, 02/2013: -1.9  . Osteoarthritis     hands    Past Surgical History  Procedure Laterality Date  . Breast enhancement surgery  1987  . Colonoscopy      There were no vitals filed for this visit.  Visit Diagnosis:  Stiffness of glenohumeral joint, right  Shoulder pain, acute, right  Weakness of right arm      Subjective Assessment - 06/11/14 1104    Symptoms Back from trip, patient without c/o.  Brought new Rx from Dr. Ninfa Linden for OK to lift overhead, proceed with abd and light strengthening. States her shoulder did not limit her experiece.    Currently in Pain? No/denies            [facility] PT Assessment - 06/11/14 1114    AROM   Right Shoulder Extension 35 Degrees   Right Shoulder Flexion 80 Degrees  supine to 100   Right Shoulder ABduction 65 Degrees   Right Shoulder External Rotation 35 Degrees   PROM   Right Shoulder Flexion 95 Degrees   Right Shoulder ABduction 80 Degrees   Right Shoulder Internal Rotation 70 Degrees   Right Shoulder External Rotation 44 Degrees   Strength   Right Shoulder Flexion 2+/5   Right Shoulder ABduction 2+/5   Left Shoulder Flexion 4+/5   Left Shoulder Extension 4+/5   Left Shoulder ABduction 5/5   Left  Shoulder Internal Rotation 4/5                   [facility] Adult PT Treatment/Exercise - 06/11/14 1138    Shoulder Exercises: Supine   External Rotation AAROM;Right;15 reps   Flexion AAROM;Both;15 reps   Theraband Level (Shoulder Flexion) --  cane   ABduction AAROM;Right;10 reps  seated cane    Shoulder Exercises: Seated   Elevation AAROM;Both;5 reps   Theraband Level (Shoulder Elevation) --  discomfort, weak with cane   Extension Strengthening;Both;10 reps   Theraband Level (Shoulder Extension) Level 2 (Red)   Row Strengthening;Both;10 reps   Theraband Level (Shoulder Row) Level 2 (Red)   External Rotation Strengthening;Right;10 reps   Theraband Level (Shoulder External Rotation) --  isometric with PT x 10   Shoulder Exercises: ROM/Strengthening   UBE (Upper Arm Bike) 6 min level 1 cues for posture   Wall Wash used ball for flexion x 10 and wall ladder for abduction x 8    Cryotherapy   Number Minutes Cryotherapy 10 Minutes   Cryotherapy Location Shoulder   Type of Cryotherapy Ice pack   Manual Therapy   Manual Therapy Joint mobilization;Passive ROM   Joint Mobilization inferior glides for inc ABD   Passive ROM o tolerance  PT Education - 06/11/14 1157    Education provided Yes   Education Details new HEP for AAROM, strength    Person(s) Educated Patient   Methods Explanation;Demonstration;Handout   Comprehension Verbalized understanding;Returned demonstration;Need further instruction          PT Short Term Goals - 06/11/14 1108    PT SHORT TERM GOAL #1   Title Pt will be I with HEP for Rt UE, posture   Status Partially Met   PT SHORT TERM GOAL #2   Title Pt will be able to reach overhead to 110 deg with AAROM of LUE    Status On-going   PT SHORT TERM GOAL #3   Title Pt will be more comfortable with sleep, improved 50% or more, without sling as MD allows.    Status Achieved   PT SHORT TERM GOAL #4   Title Pt will be able to don  clothes, ADLs with min pain increase overall   Status On-going           PT Long Term Goals - 06/11/14 1202    PT LONG TERM GOAL #1   Title Pt will be I with HEP for RT UE and RICE for pain relief.    PT LONG TERM GOAL #2   Title Pt. will report less than 2/10 pain with most ADLs, reaching.    Status On-going   PT LONG TERM GOAL #3   Title Pt will be able to lift <10 lbs for home tasks, do dishes without pain increase   Status On-going   PT LONG TERM GOAL #4   Title Pt will score <40% limited on FOTO to indicate improved functional uise of RT UE   Status On-going               Plan - 06/11/14 1158    Clinical Impression Statement This patient returns from vacation, reports "following the rules" regarding use of her arm.  She tolerates AAROM given today with pain rated moderate, limited mostly in FF, Abd and ER.     PT Next Visit Plan check [place] for abduction, Cane for flexion, ER and isometrics for abd and ER   Consulted and Agree with Plan of Care Patient        Problem List Patient Active Problem List   Diagnosis Date Noted  . Osteopenia   . Osteoarthritis   . Hypertension   . Colon polyps     PAA,JENNIFER 06/11/2014, 12:14 PM  [facility] [address] [place], [place], [address] Phone: [phone]   Fax:  [phone]

## 2014-06-24 ENCOUNTER — Ambulatory Visit: Payer: Medicare HMO | Attending: Orthopaedic Surgery | Admitting: Physical Therapy

## 2014-06-24 ENCOUNTER — Encounter: Payer: Self-pay | Admitting: Physical Therapy

## 2014-06-24 DIAGNOSIS — R29898 Other symptoms and signs involving the musculoskeletal system: Secondary | ICD-10-CM | POA: Diagnosis not present

## 2014-06-24 DIAGNOSIS — M19041 Primary osteoarthritis, right hand: Secondary | ICD-10-CM | POA: Insufficient documentation

## 2014-06-24 DIAGNOSIS — S46011D Strain of muscle(s) and tendon(s) of the rotator cuff of right shoulder, subsequent encounter: Secondary | ICD-10-CM | POA: Insufficient documentation

## 2014-06-24 DIAGNOSIS — M25511 Pain in right shoulder: Secondary | ICD-10-CM | POA: Insufficient documentation

## 2014-06-24 DIAGNOSIS — M19042 Primary osteoarthritis, left hand: Secondary | ICD-10-CM | POA: Diagnosis not present

## 2014-06-24 DIAGNOSIS — M25611 Stiffness of right shoulder, not elsewhere classified: Secondary | ICD-10-CM | POA: Diagnosis not present

## 2014-06-24 DIAGNOSIS — M8580 Other specified disorders of bone density and structure, unspecified site: Secondary | ICD-10-CM | POA: Insufficient documentation

## 2014-06-24 DIAGNOSIS — I10 Essential (primary) hypertension: Secondary | ICD-10-CM | POA: Insufficient documentation

## 2014-06-24 NOTE — Therapy (Signed)
[place], [place], [address] Phone: [phone]   Fax:  [phone]  Physical Therapy Treatment  Patient Details  Name: Sherry Mitchell MRN: [id number] Date of Birth: [birth year] Referring Provider:  Rowe Clack, MD  Encounter Date: 06/24/2014      PT End of Session - 06/24/14 1523    Visit Number 6   Number of Visits 16   Date for PT Re-Evaluation 07/03/14   PT Start Time 9381   PT Stop Time 1505   PT Time Calculation (min) 60 min   Activity Tolerance Patient tolerated treatment well   Behavior During Therapy [facility] for tasks assessed/performed      Past Medical History  Diagnosis Date  . Hypertension   . Colon polyps 02/2003, 02/2009    Adenomatous polyps  . Osteopenia     DEXA 02/2011, 02/2013: -1.9  . Osteoarthritis     hands    Past Surgical History  Procedure Laterality Date  . Breast enhancement surgery  1987  . Colonoscopy      There were no vitals filed for this visit.  Visit Diagnosis:  Shoulder pain, acute, right  Stiffness of glenohumeral joint, right  Weakness of right arm      Subjective Assessment - 06/24/14 1506    Subjective Pt states has no pain today. States that generally there is no pain with ADLs except when she forgets and brings R UE overhead.    Currently in Pain? No/denies          [facility] Adult PT Treatment/Exercise - 06/24/14 1510    Exercises   Exercises Shoulder   Shoulder Exercises: Supine   External Rotation AAROM;Right;15 reps  2 sets, with cane   ABduction AAROM;Right;15 reps  2 sets, with cane    Shoulder Exercises: Standing   Flexion Strengthening;Right;12 reps;Other (comment)  2 sets, isometrics with ball   ABduction Strengthening;Right;12 reps  2 sets, isometrics with ball   Extension Strengthening;15 reps;Both;Theraband   Theraband Level (Shoulder Extension) Level 2 (Red)   Row Strengthening;15 reps;Theraband;Both  2 sets    Shoulder Exercises: ROM/Strengthening   Wall Wash with ball for flexion x 10 reps 2 sets, laddder for abd 10 reps 2 sets    Other ROM/Strengthening Exercises Pulleys, AAROM flexion 5 minutes   Cryotherapy   Number Minutes Cryotherapy 10 Minutes   Cryotherapy Location Shoulder   Type of Cryotherapy Ice pack   Manual Therapy   Joint Mobilization inf glide, AP, and distraction grades 2-3, 2 mins each   Passive ROM R shoulder into flexion, abd, IR/ER with distraction for incr ROM            PT Education - 06/24/14 1521    Education provided Yes   Education Details new HEP: isometric strengthening at the wall with ball for flexion/ abd, AAROM with cane into abd/flexion/ER. Education on importance of controlled motion.     Person(s) Educated Patient   Methods Explanation;Demonstration;Verbal cues   Comprehension Verbalized understanding;Returned demonstration          PT Short Term Goals - 06/11/14 1108    PT SHORT TERM GOAL #1   Title Pt will be I with HEP for Rt UE, posture   Status Partially Met   PT SHORT TERM GOAL #2   Title Pt will be able to reach overhead to 110 deg with AAROM of LUE    Status On-going   PT SHORT TERM GOAL #3   Title  Pt will be more comfortable with sleep, improved 50% or more, without sling as MD allows.    Status Achieved   PT SHORT TERM GOAL #4   Title Pt will be able to don clothes, ADLs with min pain increase overall   Status On-going           PT Long Term Goals - 06/11/14 1202    PT LONG TERM GOAL #1   Title Pt will be I with HEP for RT UE and RICE for pain relief.    PT LONG TERM GOAL #2   Title Pt. will report less than 2/10 pain with most ADLs, reaching.    Status On-going   PT LONG TERM GOAL #3   Title Pt will be able to lift <10 lbs for home tasks, do dishes without pain increase   Status On-going   PT LONG TERM GOAL #4   Title Pt will score <40% limited on FOTO to indicate improved functional uise of RT UE   Status On-going           Plan - 06/24/14 1523    Clinical Impression Statement Pt is has no pain, continues to have stifness and limited ROM into flexion, abd, and ER. End range abduction causes most pain. Started isometric strengthening, pt tolerated very well with no increase in pain level.    PT Next Visit Plan continue isometric strengthening, AAROM, manual PROM and mobs, asses goals and ROM       Problem List Patient Active Problem List   Diagnosis Date Noted  . Osteopenia   . Osteoarthritis   . Hypertension   . Colon polyps     Ileana Mitchell 06/24/2014, 3:35 PM  [facility] [address] [place], [place], [address] Phone: [phone]   Fax:  [phone]

## 2014-06-24 NOTE — Patient Instructions (Signed)
Abduction (Isometric)  Strengthening: Isometric Abduction   Using wall for resistance, press left arm into ball using light pressure. Hold _5___ seconds. Repeat __10__ times per set. Do _2___ sets per session. Do __2__ sessions per day.  http://orth.exer.us/807   Copyright  VHI. All rights reserved.  Flexion (Isometric)   Press right fist against wall. Hold __5__ seconds. Repeat 10____ times. Do _2___ sessions per day.  http://gt2.exer.us/114   Copyright  VHI. All rights reserved.  Cane Exercise: Abduction / Adduction   Hold cane palms down. Keeping back flat, move cane side to side over chest. Hold __5_ seconds each side. Repeat _10-12_ times. Do _2___ sessions per day.  http://gt2.exer.us/90   Copyright  VHI. All rights reserved.  Cane Exercise: Flexion   Lie on back, holding cane above chest. Keeping arms as straight as possible, lower cane toward floor beyond head. Hold _5___ seconds. Repeat __10-12_ times. Do __2__ sessions per day.  http://gt2.exer.us/92   Copyright  VHI. All rights reserved.  SHOULDER: External Rotation - Supine (Cane)   Hold cane with both hands. Rotate arm away from body. Keep elbow on floor and next to body. _10-12__ reps per set, _2__ sets per day, _5  days per week Add towel to keep elbow at side.  Copyright  VHI. All rights reserved.

## 2014-06-27 ENCOUNTER — Encounter: Payer: Medicare HMO | Admitting: Physical Therapy

## 2014-07-01 ENCOUNTER — Ambulatory Visit: Payer: Medicare HMO | Admitting: Physical Therapy

## 2014-07-01 DIAGNOSIS — M25511 Pain in right shoulder: Secondary | ICD-10-CM

## 2014-07-01 DIAGNOSIS — R29898 Other symptoms and signs involving the musculoskeletal system: Secondary | ICD-10-CM

## 2014-07-01 DIAGNOSIS — S46011D Strain of muscle(s) and tendon(s) of the rotator cuff of right shoulder, subsequent encounter: Secondary | ICD-10-CM | POA: Diagnosis not present

## 2014-07-01 DIAGNOSIS — M25611 Stiffness of right shoulder, not elsewhere classified: Secondary | ICD-10-CM

## 2014-07-01 NOTE — Therapy (Deleted)
[place], [place], [address] Phone: [phone]   Fax:  [phone]  Physical Therapy Treatment  Patient Details  Name: Sherry Mitchell MRN: [id number] Date of Birth: 1946-01-23 Referring Provider:  Rowe Clack, MD  Encounter Date: 07/01/2014    Past Medical History  Diagnosis Date  . Hypertension   . Colon polyps 02/2003, 02/2009    Adenomatous polyps  . Osteopenia     DEXA 02/2011, 02/2013: -1.9  . Osteoarthritis     hands    Past Surgical History  Procedure Laterality Date  . Breast enhancement surgery  1987  . Colonoscopy      There were no vitals filed for this visit.  Visit Diagnosis:  No diagnosis found.                     [facility] Adult PT Treatment/Exercise - 07/01/14 1537    Shoulder Exercises: Prone   Other Prone Exercises quadruped serratus protraction/retraction   Other Prone Exercises bird dog for core x 5 each   Shoulder Exercises: Standing   External Rotation Strengthening;Right;5 reps   Theraband Level (Shoulder External Rotation) --  freemotion, too difficult   Internal Rotation Strengthening;Right;20 reps;Weights   Theraband Level (Shoulder Internal Rotation) --  1 plate   Extension [id number];WHQP;59 reps   Extension Weight (lbs) --  2 plates x15   Row [id number];SVXB;93 reps;Weights   Row Weight (lbs) --  3 plates   Other Standing Exercises diagonal pull 2 sets, 1 plate (low to high, high to low)   Manual Therapy   Joint Mobilization scapular mobilzation   Passive ROM all planes with gentle distraction     Prone: lower trap isometric x 10 Add shoulder ext with lower trap x 10 sidelying ER with 2 lbs Attempted abduction and flexion in sidelying with min PT assist, very weak and painful, unable to take weight through UE.              PT Short Term Goals - 06/11/14 1108    PT SHORT TERM GOAL #1   Title Pt will be I with  HEP for Rt UE, posture   Status Partially Met   PT SHORT TERM GOAL #2   Title Pt will be able to reach overhead to 110 deg with AAROM of LUE    Status On-going   PT SHORT TERM GOAL #3   Title Pt will be more comfortable with sleep, improved 50% or more, without sling as MD allows.    Status Achieved   PT SHORT TERM GOAL #4   Title Pt will be able to don clothes, ADLs with min pain increase overall   Status On-going           PT Long Term Goals - 06/11/14 1202    PT LONG TERM GOAL #1   Title Pt will be I with HEP for RT UE and RICE for pain relief.    PT LONG TERM GOAL #2   Title Pt. will report less than 2/10 pain with most ADLs, reaching.    Status On-going   PT LONG TERM GOAL #3   Title Pt will be able to lift <10 lbs for home tasks, do dishes without pain increase   Status On-going   PT LONG TERM GOAL #4   Title Pt will score <40% limited on FOTO to indicate improved functional uise of RT UE   Status On-going  Problem List Patient Active Problem List   Diagnosis Date Noted  . Osteopenia   . Osteoarthritis   . Hypertension   . Colon polyps     Britten Parady 07/01/2014, 3:45 PM  [place], [place], [address] Phone: [phone]   Fax:  [phone]

## 2014-07-01 NOTE — Therapy (Signed)
[place], [place], [address] Phone: [phone]   Fax:  [phone]  Physical Therapy Treatment  Patient Details  Name: MAELIE CHRISWELL MRN: [id number] Date of Birth: 03/12/1946 Referring Provider:  Rowe Clack, MD  Encounter Date: 07/01/2014      PT End of Session - 07/01/14 1628    Visit Number 7   Number of Visits 16   Date for PT Re-Evaluation 07/03/14   PT Start Time 3790   PT Stop Time 1510   PT Time Calculation (min) 65 min   Activity Tolerance Patient tolerated treatment well   Behavior During Therapy [facility] for tasks assessed/performed      Past Medical History  Diagnosis Date  . Hypertension   . Colon polyps 02/2003, 02/2009    Adenomatous polyps  . Osteopenia     DEXA 02/2011, 02/2013: -1.9  . Osteoarthritis     hands    Past Surgical History  Procedure Laterality Date  . Breast enhancement surgery  1987  . Colonoscopy      There were no vitals filed for this visit.  Visit Diagnosis:  Shoulder pain, acute, right  Stiffness of glenohumeral joint, right  Weakness of right arm      Subjective Assessment - 07/01/14 1620    Subjective no pain today, states that she is able to lift with R UE weights > 5 lbs at home with ADLs, but she is being cautious with overhead movements. States that she is doing HEP daily.    Currently in Pain? No/denies   Multiple Pain Sites No          [facility] Adult PT Treatment/Exercise - 07/01/14 1537    Shoulder Exercises: Prone   Other Prone Exercises quadruped serratus protraction/retraction   Other Prone Exercises bird dog for core x 5 each   Shoulder Exercises: Standing   External Rotation Strengthening;Right;5 reps   Theraband Level (Shoulder External Rotation) --  freemotion, too difficult   Internal Rotation Strengthening;Right;20 reps;Weights   Theraband Level (Shoulder Internal Rotation) --  1 plate   Extension  [id number];QAST;41 reps   Extension Weight (lbs) --  2 plates x15   Row [id number];HERD;40 reps;Weights   Row Weight (lbs) --  3 plates   Other Standing Exercises diagonal pull 2 sets, 1 plate (low to high, high to low)   Shoulder Exercises: Pulleys   Flexion 2 minutes   ABduction 2 minutes   Shoulder Exercises: ROM/Strengthening   UBE (Upper Arm Bike) 3 min frwd, 3 min bckwrd, level 2   Other ROM/Strengthening Exercises wall ladder abduction, 8 reps, reached to level 13   Manual Therapy   Joint Mobilization scapular mobilzation   Passive ROM all planes with gentle distraction          PT Education - 07/01/14 1625    Education provided Yes   Education Details self stretch into ER   Person(s) Educated Patient   Methods Explanation;Demonstration   Comprehension Verbalized understanding;Returned demonstration          PT Short Term Goals - 07/01/14 1626    PT SHORT TERM GOAL #1   Title Pt will be I with HEP for Rt UE, posture   Status Achieved   PT SHORT TERM GOAL #2   Title Pt will be able to reach overhead to 110 deg with AAROM of LUE    Status Achieved   PT SHORT TERM GOAL #3   Title Pt will be more  comfortable with sleep, improved 50% or more, without sling as MD allows.    Status Achieved   PT SHORT TERM GOAL #4   Title Pt will be able to don clothes, ADLs with min pain increase overall   Status Achieved           PT Long Term Goals - 07/01/14 1627    PT LONG TERM GOAL #1   Title Pt will be I with HEP for RT UE and RICE for pain relief.    Status Achieved   PT LONG TERM GOAL #2   Title Pt. will report less than 2/10 pain with most ADLs, reaching.    Status Partially Met  Sometimes 2/10   PT LONG TERM GOAL #3   Title Pt will be able to lift <10 lbs for home tasks, do dishes without pain increase   Status Achieved   PT LONG TERM GOAL #4   Title Pt will score <40% limited on FOTO to indicate improved functional uise of RT UE   Status On-going           Plan - 07/01/14 1628    Clinical Impression Statement Ketara is starting to use her R shoulder more functionally, including lifting objects and reaching into the cabinets and microwave. She is using caution with overhead movements, especially scaption and abduction. Self-strech for ER was introdused today, Lakaisha tolerated it well and will be adding it to her HEP.   PT Next Visit Plan 8th visit - re-evaluate, check AROM, PROM, and strength of R shoulder, progress srengthening by adding rows with light band   PT Home Exercise Plan as previous plus ER self stretch    Consulted and Agree with Plan of Care Patient        Problem List Patient Active Problem List   Diagnosis Date Noted  . Osteopenia   . Osteoarthritis   . Hypertension   . Colon polyps     [name] 07/01/2014, 4:39 PM  [place], [place], [address] Phone: [phone]   Fax:  [phone]

## 2014-07-04 ENCOUNTER — Encounter: Payer: Medicare HMO | Admitting: Physical Therapy

## 2014-07-08 ENCOUNTER — Encounter: Payer: Medicare HMO | Admitting: Physical Therapy

## 2014-07-11 ENCOUNTER — Encounter: Payer: Medicare HMO | Admitting: Physical Therapy

## 2014-07-15 ENCOUNTER — Ambulatory Visit: Payer: Medicare HMO | Admitting: Physical Therapy

## 2014-07-15 DIAGNOSIS — M25611 Stiffness of right shoulder, not elsewhere classified: Secondary | ICD-10-CM

## 2014-07-15 DIAGNOSIS — S46011D Strain of muscle(s) and tendon(s) of the rotator cuff of right shoulder, subsequent encounter: Secondary | ICD-10-CM | POA: Diagnosis not present

## 2014-07-15 DIAGNOSIS — R29898 Other symptoms and signs involving the musculoskeletal system: Secondary | ICD-10-CM

## 2014-07-15 DIAGNOSIS — M25511 Pain in right shoulder: Secondary | ICD-10-CM

## 2014-07-15 NOTE — Addendum Note (Signed)
Addended by: Raeford Razor L on: 07/15/2014 03:51 PM   Modules accepted: Orders

## 2014-07-15 NOTE — Therapy (Addendum)
[place], [place], [address] Phone: [phone]   Fax:  [phone]  Physical Therapy Treatment/Renewal  Patient Details  Name: Sherry Mitchell MRN: [id number] Date of Birth: 11/13/1945 Referring Provider:  Rowe Clack, MD  Encounter Date: 07/15/2014      PT End of Session - 07/15/14 1530    Visit Number 8   Number of Visits 20   Date for PT Re-Evaluation 08/26/14   PT Start Time 7026   PT Stop Time 1500   PT Time Calculation (min) 40 min   Activity Tolerance Patient tolerated treatment well;No increased pain      Past Medical History  Diagnosis Date  . Hypertension   . Colon polyps 02/2003, 02/2009    Adenomatous polyps  . Osteopenia     DEXA 02/2011, 02/2013: -1.9  . Osteoarthritis     hands    Past Surgical History  Procedure Laterality Date  . Breast enhancement surgery  1987  . Colonoscopy      There were no vitals filed for this visit.  Visit Diagnosis:  Shoulder pain, acute, right  Stiffness of glenohumeral joint, right  Weakness of right arm      Subjective Assessment - 07/15/14 1420    Subjective Dr. Ninfa Linden was pleased with progress.  No pain in shoulder.     Currently in Pain? No/denies            [facility] PT Assessment - 07/15/14 1421    Posture/Postural Control   Posture/Postural Control Postural limitations   Posture Comments Rt. scapular elevation with forward flexion, abd.     AROM   Right Shoulder Flexion 100 Degrees   Strength   Right Shoulder Flexion 3/5   Right Shoulder ABduction 3-/5   Right Shoulder Internal Rotation 4/5   Right Shoulder External Rotation 3/5   Left Shoulder Flexion 4+/5   Left Shoulder Extension 4+/5                     [facility] Adult PT Treatment/Exercise - 07/15/14 1428    Shoulder Exercises: Supine   External Rotation Strengthening;Both;20 reps;Theraband   Theraband Level (Shoulder External Rotation)  Level 1 (Yellow)   External Rotation Weight (lbs) --  unattached ER/IR   Shoulder Exercises: Pulleys   Flexion 2 minutes   ABduction 2 minutes    Corner stretch 30 sec x 3 (goal post) ER seated for Rt. UE x 5, 10 sec     Pilates Reformer used for LE/core strength, postural strength, lumbopelvic disassociation and core control.  Exercises included: Reverse Long box Prone: 1 Red overhead press double (modified for ROM) and single arm. Manual cues.  Rt. UE unable to fully extend elbow.  Mod to blue spring and could do 10 reps.   Seated Arms facing back 1 Red Row x10   1 Blue extension x 10   horiz abd 1 blue x5  Seated arms facing front   Serving (forward flexion) x 10 , 1 blue spring  Adduction x 5 , 1 yellow spring  Reports Rt UE fatigue and no pain increase      PT Education - 07/15/14 1529    Education provided Yes   Education Details New strength HEP   Person(s) Educated Patient   Methods Explanation;Demonstration;Handout   Comprehension Verbalized understanding;Returned demonstration          PT Short Term Goals - 07/15/14 1535    PT SHORT TERM GOAL #  1   Title Pt will be I with HEP for Rt UE, posture   Status Achieved   PT SHORT TERM GOAL #2   Title Pt will be able to reach overhead to 110 deg with AAROM of LUE    Status Achieved   PT SHORT TERM GOAL #3   Title Pt will be more comfortable with sleep, improved 50% or more, without sling as MD allows.    Status Achieved   PT SHORT TERM GOAL #4   Title Pt will be able to don clothes, ADLs with min pain increase overall   Status Achieved           PT Long Term Goals - 07/15/14 1535    PT LONG TERM GOAL #1   Title Pt will be I with HEP for RT UE and RICE for pain relief.    Status On-going   PT LONG TERM GOAL #2   Title Pt. will report less than 2/10 pain with most ADLs, reaching.    Status On-going   PT LONG TERM GOAL #3   Title Pt will be able to lift <10 lbs for home tasks, do dishes without pain  increase   Status Achieved   PT LONG TERM GOAL #4   Title Pt will score <40% limited on FOTO to indicate improved functional uise of RT UE   Status Unable to assess               Plan - 07/15/14 1532    Clinical Impression Statement Patient is progressing so well, she continues to have Rt. shoulder weakness, scapular instability and decr AROM for functional use of Rt. UE.  She will benefit from skilled PT to further  improve functional use of Rt UE>    Pt will benefit from skilled therapeutic intervention in order to improve on the following deficits Decreased range of motion;Impaired flexibility;Impaired sensation;Decreased activity tolerance;Increased fascial restricitons;Decreased skin integrity;Impaired UE functional use;Pain;Decreased scar mobility;Decreased strength;Decreased mobility   Rehab Potential Excellent   PT Frequency 2x / week   PT Duration 6 weeks   PT Treatment/Interventions ADLs/Self Care Home Management;Moist Heat;Therapeutic activities;Patient/family education;Scar mobilization;Passive range of motion;Therapeutic exercise;Ultrasound;Manual techniques;Dry needling;Neuromuscular re-education;Cryotherapy;Electrical Stimulation;Functional mobility training   PT Next Visit Plan FOTO!! check HEP and progress strengthening   PT Home Exercise Plan flexion, abd and ER yellow band, row with blue, corner stretch and other AROM ex.    Consulted and Agree with Plan of Care Patient        Problem List Patient Active Problem List   Diagnosis Date Noted  . Osteopenia   . Osteoarthritis   . Hypertension   . Colon polyps     Dain Laseter 07/15/2014, 3:40 PM  [facility] [address] [place], [place], [address] Phone: [phone]   Fax:  [phone]

## 2014-07-15 NOTE — Patient Instructions (Addendum)
     Pt asked to stop doing isometrics as she is progressing with bands. Using yellow for shoulder and blue for scap (rows)

## 2014-07-17 ENCOUNTER — Ambulatory Visit: Payer: Medicare HMO | Admitting: Physical Therapy

## 2014-07-17 DIAGNOSIS — M25511 Pain in right shoulder: Secondary | ICD-10-CM

## 2014-07-17 DIAGNOSIS — R29898 Other symptoms and signs involving the musculoskeletal system: Secondary | ICD-10-CM

## 2014-07-17 DIAGNOSIS — S46011D Strain of muscle(s) and tendon(s) of the rotator cuff of right shoulder, subsequent encounter: Secondary | ICD-10-CM | POA: Diagnosis not present

## 2014-07-17 DIAGNOSIS — M25611 Stiffness of right shoulder, not elsewhere classified: Secondary | ICD-10-CM

## 2014-07-17 NOTE — Therapy (Signed)
[place], [place], [address] Phone: [phone]   Fax:  [phone]  Physical Therapy Treatment  Patient Details  Name: Sherry Mitchell MRN: [id number] Date of Birth: 27-Sep-1945 Referring Provider:  Mcarthur Rossetti*  Encounter Date: 07/17/2014      PT End of Session - 07/17/14 1507    Visit Number 9   Number of Visits 20   Date for PT Re-Evaluation 08/26/14   PT Start Time 2023   PT Stop Time 3435   PT Time Calculation (min) 46 min   Activity Tolerance Patient tolerated treatment well;No increased pain      Past Medical History  Diagnosis Date  . Hypertension   . Colon polyps 02/2003, 02/2009    Adenomatous polyps  . Osteopenia     DEXA 02/2011, 02/2013: -1.9  . Osteoarthritis     hands    Past Surgical History  Procedure Laterality Date  . Breast enhancement surgery  1987  . Colonoscopy      There were no vitals filed for this visit.  Visit Diagnosis:  Shoulder pain, acute, right  Stiffness of glenohumeral joint, right  Weakness of right arm      Subjective Assessment - 07/17/14 1424    Subjective No pain today.              [facility] Adult PT Treatment/Exercise - 07/17/14 1434    Shoulder Exercises: Standing   External Rotation Strengthening;Right;20 reps;Theraband   Theraband Level (Shoulder External Rotation) Level 2 (Red)   Internal Rotation Strengthening;Right;20 reps;Theraband   Theraband Level (Shoulder Internal Rotation) Level 1 (Yellow)   Flexion Strengthening;Both;Theraband   Theraband Level (Shoulder Flexion) Level 2 (Red)   ABduction Strengthening;Right;10 reps   Theraband Level (Shoulder ABduction) Level 1 (Yellow);Other (comment)  against the wall with ball at scap   Extension Strengthening;Right;20 reps;Theraband   Theraband Level (Shoulder Extension) Level 2 (Red)   Row Strengthening;Both;20 reps;Theraband   Theraband Level (Shoulder Row) Level 2  (Red)   Other Standing Exercises rows against the wall with ball between scapula   Shoulder Exercises: ROM/Strengthening   UBE (Upper Arm Bike) 5 min level 1 for strengthening   Manual Therapy   Joint Mobilization GH A/P mob gr 2-3   Scapular Mobilization all planes   Passive ROM all planes to tolerance                PT Education - 07/17/14 1507    Education provided No          PT Short Term Goals - 07/15/14 1535    PT SHORT TERM GOAL #1   Title Pt will be I with HEP for Rt UE, posture   Status Achieved   PT SHORT TERM GOAL #2   Title Pt will be able to reach overhead to 110 deg with AAROM of LUE    Status Achieved   PT SHORT TERM GOAL #3   Title Pt will be more comfortable with sleep, improved 50% or more, without sling as MD allows.    Status Achieved   PT SHORT TERM GOAL #4   Title Pt will be able to don clothes, ADLs with min pain increase overall   Status Achieved           PT Long Term Goals - 07/15/14 1535    PT LONG TERM GOAL #1   Title Pt will be I with HEP for RT UE and RICE for pain relief.  Status On-going   PT LONG TERM GOAL #2   Title Pt. will report less than 2/10 pain with most ADLs, reaching.    Status On-going   PT LONG TERM GOAL #3   Title Pt will be able to lift <10 lbs for home tasks, do dishes without pain increase   Status Achieved   PT LONG TERM GOAL #4   Title Pt will score <40% limited on FOTO to indicate improved functional uise of RT UE   Status Unable to assess               Plan - Aug 07, 2014 1443    Clinical Impression Statement Patient completed FOTO prior to PT today, questionable technical difficulties as she scored 100 % limited.  This PT did another FOTO for same visit with her input and scored 21% limimted.  She continues to do well, tolerates ther ex well but continues to have abnormal scapular movement/compensations.     PT Next Visit Plan cont with POC, strength, manual prior to ther ex.   PT Home Exercise  Plan cont previously given   Consulted and Agree with Plan of Care Patient          G-Codes - 07-Aug-2014 1447    Functional Assessment Tool Used FOTO   Functional Limitation Carrying, moving and handling objects   Carrying, Moving and Handling Objects Current Status [phone]) At least 20 percent but less than 40 percent impaired, limited or restricted   Carrying, Moving and Handling Objects Goal Status (D9741) At least 20 percent but less than 40 percent impaired, limited or restricted      Problem List Patient Active Problem List   Diagnosis Date Noted  . Osteopenia   . Osteoarthritis   . Hypertension   . Colon polyps     Sherry Mitchell 08-07-14, 3:11 PM  [place], [place], [address] Phone: [phone]   Fax:  [phone]

## 2014-07-18 ENCOUNTER — Encounter: Payer: Medicare HMO | Admitting: Physical Therapy

## 2014-07-31 ENCOUNTER — Ambulatory Visit: Payer: Medicare HMO | Attending: Orthopaedic Surgery | Admitting: Physical Therapy

## 2014-07-31 DIAGNOSIS — M25611 Stiffness of right shoulder, not elsewhere classified: Secondary | ICD-10-CM | POA: Insufficient documentation

## 2014-07-31 DIAGNOSIS — R29898 Other symptoms and signs involving the musculoskeletal system: Secondary | ICD-10-CM | POA: Diagnosis not present

## 2014-07-31 DIAGNOSIS — M25511 Pain in right shoulder: Secondary | ICD-10-CM

## 2014-07-31 NOTE — Therapy (Signed)
[place], [place], [address] Phone: [phone]   Fax:  [phone]  Physical Therapy Treatment  Patient Details  Name: Sherry Mitchell MRN: [id number] Date of Birth: Jan 03, 1946 Referring Provider:  Mcarthur Rossetti*  Encounter Date: 07/31/2014      PT End of Session - 07/31/14 1434    Visit Number 10   Number of Visits 20   Date for PT Re-Evaluation 08/26/14   PT Start Time 5361   PT Stop Time 4431   PT Time Calculation (min) 43 min   Activity Tolerance Patient tolerated treatment well;No increased pain   Behavior During Therapy [facility] for tasks assessed/performed      Past Medical History  Diagnosis Date  . Hypertension   . Colon polyps 02/2003, 02/2009    Adenomatous polyps  . Osteopenia     DEXA 02/2011, 02/2013: -1.9  . Osteoarthritis     hands    Past Surgical History  Procedure Laterality Date  . Breast enhancement surgery  1987  . Colonoscopy      There were no vitals filed for this visit.  Visit Diagnosis:  Shoulder pain, acute, right  Stiffness of glenohumeral joint, right  Weakness of right arm      Subjective Assessment - 07/31/14 1433    Subjective No pain, had a good vacation without limitations of shoulders.             [facility] PT Assessment - 07/31/14 1511    PROM   Right Shoulder Flexion 130 Degrees  140 supine with AAROM, 95 seated   Right Shoulder ABduction 85 Degrees  seated   Right Shoulder Internal Rotation 75 Degrees  80 PROM   Right Shoulder External Rotation 50 Degrees  65 PROM   Strength   Right Shoulder Flexion 3/5                     [facility] Adult PT Treatment/Exercise - 07/31/14 1442    Shoulder Exercises: Sidelying   External Rotation Strengthening;Right;20 reps;Weights   External Rotation Weight (lbs) 2   Flexion Strengthening;Left;10 reps;Weights   Theraband Level (Shoulder Flexion) --  2 sets   Flexion Weight (lbs)  2   ABduction Strengthening;10 reps;Weights   Theraband Level (Shoulder ABduction) --  2 sets   ABduction Weight (lbs) 2   Shoulder Exercises: ROM/Strengthening   UBE (Upper Arm Bike) 5 min level 5 resist. (FW and back)   Plank 5 reps;Other (comment)   Plank Limitations x 10 sec in quadruped   Modified Plank --  see above   Other ROM/Strengthening Exercises I, Y, T over green swiss balll x 15 each    Other ROM/Strengthening Exercises UE ranger flexion and vectors at height 29 inches   Manual Therapy   Joint Mobilization [place] A/P mob gr 2-3   Scapular Mobilization all planes   Passive ROM all planes to tolerance                PT Education - 07/31/14 1517    Education provided Yes   Education Details ROM   Person(s) Educated Patient   Methods Explanation;Demonstration   Comprehension Verbalized understanding;Returned demonstration          PT Short Term Goals - 07/31/14 1533    PT SHORT TERM GOAL #1   Title Pt will be I with HEP for Rt UE, posture   Status Achieved   PT SHORT TERM GOAL #2   Title  Pt will be able to reach overhead to 110 deg with AAROM of LUE    Status Achieved   PT SHORT TERM GOAL #3   Title Pt will be more comfortable with sleep, improved 50% or more, without sling as MD allows.    Status Achieved   PT SHORT TERM GOAL #4   Title Pt will be able to don clothes, ADLs with min pain increase overall   Status Achieved           PT Long Term Goals - 08-29-2014 1534    PT LONG TERM GOAL #1   Title Pt will be I with HEP for RT UE and RICE for pain relief.    Status On-going   PT LONG TERM GOAL #2   Title Pt. will report less than 2/10 pain with most ADLs, reaching.    Status On-going   PT LONG TERM GOAL #3   Title Pt will be able to lift <10 lbs for home tasks, do dishes without pain increase   Status Achieved   PT LONG TERM GOAL #4   Title Pt will score <40% limited on FOTO to indicate improved functional uise of RT UE   Status Achieved                Plan - 2014-08-29 1522    Clinical Impression Statement Patient can tolerate strengthening without increased pain, cont. to be restricted in reaching overhead and into abd.  AROM in ER/IR improved. She still has significant weakness in these areas too (3/5) in available ROM   PT Next Visit Plan cont with POC, strength, manual prior to ther ex.   PT Home Exercise Plan cont previously given, check strength HEP   Consulted and Agree with Plan of Care Patient          G-Codes - 08-29-14 1535    Functional Assessment Tool Used clin judgement   Functional Limitation Carrying, moving and handling objects   Carrying, Moving and Handling Objects Current Status [phone]) At least 20 percent but less than 40 percent impaired, limited or restricted   Carrying, Moving and Handling Objects Goal Status ([id number]) At least 20 percent but less than 40 percent impaired, limited or restricted      Problem List Patient Active Problem List   Diagnosis Date Noted  . Osteopenia   . Osteoarthritis   . Hypertension   . Colon polyps     Sherry Mitchell 29-Aug-2014, 3:36 PM  [place], [place], [address] Phone: [phone]   Fax:  [phone]

## 2014-08-05 ENCOUNTER — Ambulatory Visit: Payer: Medicare HMO | Admitting: Physical Therapy

## 2014-08-05 DIAGNOSIS — M25611 Stiffness of right shoulder, not elsewhere classified: Secondary | ICD-10-CM

## 2014-08-05 DIAGNOSIS — M25511 Pain in right shoulder: Secondary | ICD-10-CM | POA: Diagnosis not present

## 2014-08-05 DIAGNOSIS — R29898 Other symptoms and signs involving the musculoskeletal system: Secondary | ICD-10-CM

## 2014-08-05 NOTE — Therapy (Signed)
[place], [place], [address] Phone: [phone]   Fax:  [phone]  Physical Therapy Treatment  Patient Details  Name: Sherry Mitchell MRN: [id number] Date of Birth: 09/07/1945 Referring Provider:  Rowe Clack, MD  Encounter Date: 08/05/2014      PT End of Session - 08/05/14 1507    Visit Number 11   Number of Visits 20   Date for PT Re-Evaluation 08/26/14   PT Start Time 7824   PT Stop Time 1500   PT Time Calculation (min) 48 min   Activity Tolerance Patient tolerated treatment well;No increased pain      Past Medical History  Diagnosis Date  . Hypertension   . Colon polyps 02/2003, 02/2009    Adenomatous polyps  . Osteopenia     DEXA 02/2011, 02/2013: -1.9  . Osteoarthritis     hands    Past Surgical History  Procedure Laterality Date  . Breast enhancement surgery  1987  . Colonoscopy      There were no vitals filed for this visit.  Visit Diagnosis:  Shoulder pain, acute, right  Stiffness of glenohumeral joint, right  Weakness of right arm          [facility] PT Assessment - 08/05/14 1508    PROM   Right Shoulder Extension 62 Degrees  check for tight ant capsule   Left Shoulder Extension 68 Degrees           [facility] Adult PT Treatment/Exercise - 08/05/14 1419    Shoulder Exercises: Supine   Flexion AAROM;Strengthening;Both;10 reps;Other (comment)   Shoulder Flexion Weight (lbs) used magic circle    Other Supine Exercises isometric horiz add and ab set   Other Supine Exercises flexion and diagonal pull yellow x 10 each    Shoulder Exercises: Seated   Elevation Strengthening;Right;10 reps   Shoulder Exercises: Standing   Flexion Strengthening;Right;20 reps;Theraband   Theraband Level (Shoulder Flexion) Level 1 (Yellow)   Shoulder Flexion Weight (lbs) 0-40 deg   Row Strengthening;Both;20 reps;Theraband   Theraband Level (Shoulder Row) Level 3 (Green)   Other  Standing Exercises flexion with red band 0-30 x 20   Other Standing Exercises push ups (wall x 10, countertop x 10)    Shoulder Exercises: Pulleys   Flexion 3 minutes   ABduction 2 minutes   Shoulder Exercises: ROM/Strengthening   Other ROM/Strengthening Exercises stepper with hands on pedals 3 min no added resistance, scapular motion   Other ROM/Strengthening Exercises UE ranger overhead with step added for incr stretch                PT Education - 08/05/14 1507    Education provided Yes   Education Details HEP supine vs standing   Person(s) Educated Patient   Methods Explanation;Demonstration   Comprehension Verbalized understanding          PT Short Term Goals - 07/31/14 1533    PT SHORT TERM GOAL #1   Title Pt will be I with HEP for Rt UE, posture   Status Achieved   PT SHORT TERM GOAL #2   Title Pt will be able to reach overhead to 110 deg with AAROM of LUE    Status Achieved   PT SHORT TERM GOAL #3   Title Pt will be more comfortable with sleep, improved 50% or more, without sling as MD allows.    Status Achieved   PT SHORT TERM GOAL #4   Title Pt will  be able to don clothes, ADLs with min pain increase overall   Status Achieved           PT Long Term Goals - 07/31/14 1534    PT LONG TERM GOAL #1   Title Pt will be I with HEP for RT UE and RICE for pain relief.    Status On-going   PT LONG TERM GOAL #2   Title Pt. will report less than 2/10 pain with most ADLs, reaching.    Status On-going   PT LONG TERM GOAL #3   Title Pt will be able to lift <10 lbs for home tasks, do dishes without pain increase   Status Achieved   PT LONG TERM GOAL #4   Title Pt will score <40% limited on FOTO to indicate improved functional uise of RT UE   Status Achieved               Plan - 08/05/14 1510    Clinical Impression Statement Pt continues to be limited in reaching overhead, mostly due to weakness.  She reports consistency with HEP for strengthening.     PT Next Visit Plan cont with POC, strength, manual prior to ther ex.   PT Home Exercise Plan cont previously given, check strength HEP   Consulted and Agree with Plan of Care Patient        Problem List Patient Active Problem List   Diagnosis Date Noted  . Osteopenia   . Osteoarthritis   . Hypertension   . Colon polyps     Dannie Woolen 08/05/2014, 3:13 PM  [facility], [place], [address] Phone: [phone]   Fax:  [phone]  Raeford Razor, PT 08/05/2014 3:13 PM Phone: [phone] Fax: [phone]

## 2014-08-12 ENCOUNTER — Ambulatory Visit: Payer: Medicare HMO | Admitting: Physical Therapy

## 2014-08-12 DIAGNOSIS — M25611 Stiffness of right shoulder, not elsewhere classified: Secondary | ICD-10-CM

## 2014-08-12 DIAGNOSIS — M25511 Pain in right shoulder: Secondary | ICD-10-CM | POA: Diagnosis not present

## 2014-08-12 DIAGNOSIS — R29898 Other symptoms and signs involving the musculoskeletal system: Secondary | ICD-10-CM

## 2014-08-12 NOTE — Patient Instructions (Signed)
v

## 2014-08-12 NOTE — Therapy (Signed)
[place], [place], [address] Phone: [phone]   Fax:  [phone]  Physical Therapy Treatment  Patient Details  Name: Sherry Mitchell MRN: [id number] Date of Birth: 1945/07/01 Referring Provider:  Rowe Clack, MD  Encounter Date: 08/12/2014      PT End of Session - 08/12/14 1434    Visit Number 12   Number of Visits 20   Date for PT Re-Evaluation 08/26/14   PT Start Time 0354   PT Stop Time 1500   PT Time Calculation (min) 43 min   Activity Tolerance Patient tolerated treatment well;No increased pain      Past Medical History  Diagnosis Date  . Hypertension   . Colon polyps 02/2003, 02/2009    Adenomatous polyps  . Osteopenia     DEXA 02/2011, 02/2013: -1.9  . Osteoarthritis     hands    Past Surgical History  Procedure Laterality Date  . Breast enhancement surgery  1987  . Colonoscopy      There were no vitals filed for this visit.  Visit Diagnosis:  Shoulder pain, acute, right  Stiffness of glenohumeral joint, right  Weakness of right arm      Subjective Assessment - 08/12/14 1422    Subjective No c/o today, reports discomfort in shoulder with ADLs.  Wants to work on Hotel manager.    Currently in Pain? No/denies            [facility] PT Assessment - 08/12/14 1434    PROM   Right Shoulder Flexion 90 Degrees  after exercising   Right Shoulder ABduction 90 Degrees   Strength   Right Shoulder Flexion 3+/5  supine, in available ROM   Right Shoulder ABduction 3+/5   Right Shoulder Internal Rotation 4/5   Right Shoulder External Rotation 3+/5                     [facility] Adult PT Treatment/Exercise - 08/12/14 1435    Shoulder Exercises: Supine   External Rotation Strengthening;Both;20 reps;Theraband   Theraband Level (Shoulder External Rotation) Level 2 (Red)   Internal Rotation Strengthening;Both;20 reps;Theraband   Theraband Level (Shoulder Internal  Rotation) Level 2 (Red)   Internal Rotation Weight (lbs) eccentric   Flexion Strengthening;Right;20 reps;Theraband   Theraband Level (Shoulder Flexion) Level 1 (Yellow)   Other Supine Exercises horiz abd red x 20    Shoulder Exercises: ROM/Strengthening   UBE (Upper Arm Bike) 6 min level 5, 3 min FW and 3 min back    Other ROM/Strengthening Exercises wall wash flexion with lift off x 10 each   Manual Therapy   Joint Mobilization GH A/P mob gr 2-3   Scapular Mobilization all planes   Passive ROM all planes to tolerance     standing "punch " red x 10 Elevation yellow x 10 ER/IR at 60 deg x 10 red Supine red  diagonal D2 x 10 Extension red x 10  Scapular protraction/retraction x 10 at wall Standing horiz abd at wall x 10 red  Cues for symmetry and shoulder hike, unable to correct with most exercises.         PT Education - 08/12/14 1503    Education provided Yes   Education Details wall slide with lift off HEP   Person(s) Educated Patient   Methods Explanation   Comprehension Verbalized understanding;Returned demonstration          PT Short Term Goals - 08/12/14 1520  PT SHORT TERM GOAL #1   Title Pt will be I with HEP for Rt UE, posture   Status Achieved   PT SHORT TERM GOAL #2   Title Pt will be able to reach overhead to 110 deg with AAROM of LUE    Status Achieved   PT SHORT TERM GOAL #3   Title Pt will be more comfortable with sleep, improved 50% or more, without sling as MD allows.    Status Achieved   PT SHORT TERM GOAL #4   Title Pt will be able to don clothes, ADLs with min pain increase overall   Status Achieved           PT Long Term Goals - 08/12/14 1521    PT LONG TERM GOAL #1   Title Pt will be I with HEP for RT UE and RICE for pain relief.    Status On-going   PT LONG TERM GOAL #2   Title Pt. will report less than 2/10 pain with most ADLs, reaching.    Status Achieved   PT LONG TERM GOAL #3   Title Pt will be able to lift <10 lbs for home  tasks, do dishes without pain increase   Status Achieved   PT LONG TERM GOAL #4   Title Pt will score <40% limited on FOTO to indicate improved functional uise of RT UE   Status Achieved               Plan - 08/12/14 1512    Clinical Impression Statement Patient with significant asymmetry of movement and Rt UE weakness.  She was given cues to reduce shoulder hike with reaching. She states that prior to surgery she had weakness and so she may have a loonger road to recovery that she thought.  Question if patient has nerve damage to Rt. UE due to severe dislocation?   PT Next Visit Plan check for muscle atrophy of RC muscles, deltoid/biceps, muscle stim? MMT ER in sidelying, ask about sensory changes   PT Home Exercise Plan added wall with lift off   Consulted and Agree with Plan of Care Patient        Problem List Patient Active Problem List   Diagnosis Date Noted  . Osteopenia   . Osteoarthritis   . Hypertension   . Colon polyps     PAA,JENNIFER 08/12/2014, 3:24 PM  [facility] [address] [place], [place], [address] Phone: [phone]   Fax:  [phone]   Raeford Razor, PT 08/12/2014 3:27 PM Phone: [phone] Fax: [phone]

## 2014-08-21 ENCOUNTER — Ambulatory Visit: Payer: Medicare HMO | Attending: Orthopaedic Surgery | Admitting: Physical Therapy

## 2014-08-21 DIAGNOSIS — R29898 Other symptoms and signs involving the musculoskeletal system: Secondary | ICD-10-CM | POA: Insufficient documentation

## 2014-08-21 DIAGNOSIS — M25611 Stiffness of right shoulder, not elsewhere classified: Secondary | ICD-10-CM

## 2014-08-21 DIAGNOSIS — M25511 Pain in right shoulder: Secondary | ICD-10-CM | POA: Diagnosis present

## 2014-08-21 NOTE — Therapy (Signed)
[place], [place], [address] Phone: [phone]   Fax:  [phone]  Physical Therapy Treatment  Patient Details  Name: Sherry Mitchell MRN: [id number] Date of Birth: 05-Mar-1946 Referring Provider:  Rowe Clack, MD  Encounter Date: 08/21/2014      PT End of Session - 08/21/14 1523    Visit Number 13   Number of Visits 20   Date for PT Re-Evaluation 08/26/14   PT Start Time 3329   PT Stop Time 1520   PT Time Calculation (min) 43 min   Activity Tolerance Patient tolerated treatment well;No increased pain   Behavior During Therapy [facility] for tasks assessed/performed      Past Medical History  Diagnosis Date  . Hypertension   . Colon polyps 02/2003, 02/2009    Adenomatous polyps  . Osteopenia     DEXA 02/2011, 02/2013: -1.9  . Osteoarthritis     hands    Past Surgical History  Procedure Laterality Date  . Breast enhancement surgery  1987  . Colonoscopy      There were no vitals filed for this visit.  Visit Diagnosis:  Shoulder pain, acute, right  Stiffness of glenohumeral joint, right  Weakness of right arm      Subjective Assessment - 08/21/14 1527    Subjective No c/o, slept on it wrong, still c/o weakness with reaching but no pain.    Currently in Pain? No/denies                         [facility] Adult PT Treatment/Exercise - 08/21/14 1449    Shoulder Exercises: Supine   Protraction Strengthening;Right;15 reps   Protraction Weight (lbs) 4   Flexion Strengthening;Right;12 reps   Theraband Level (Shoulder Flexion) --  2 sets   Shoulder Flexion Weight (lbs) 2   Other Supine Exercises stabilization  circles x 20    Shoulder Exercises: Prone   Extension Strengthening;Right;10 reps   Extension Weight (lbs) 4   Horizontal ABduction 1 Strengthening;Right;10 reps   Other Prone Exercises row 4 lbs x 15   Shoulder Exercises: ROM/Strengthening   UBE (Upper Arm  Bike) 6 min, intervals, level 5 forward   Other ROM/Strengthening Exercises pulleys for IR, flexion 2 min, abduction 2 min    Other ROM/Strengthening Exercises UE ranger 2 lbs forward flexion, scaption and add/abd x10  each    Manual Therapy   Joint Mobilization GH A/P mob gr 2-3   Scapular Mobilization all planes   Passive ROM all planes to tolerance                PT Education - 08/21/14 1523    Education provided No          PT Short Term Goals - 08/12/14 1520    PT SHORT TERM GOAL #1   Title Pt will be I with HEP for Rt UE, posture   Status Achieved   PT SHORT TERM GOAL #2   Title Pt will be able to reach overhead to 110 deg with AAROM of LUE    Status Achieved   PT SHORT TERM GOAL #3   Title Pt will be more comfortable with sleep, improved 50% or more, without sling as MD allows.    Status Achieved   PT SHORT TERM GOAL #4   Title Pt will be able to don clothes, ADLs with min pain increase overall   Status Achieved  PT Long Term Goals - 08/21/14 1526    PT LONG TERM GOAL #1   Title Pt will be I with HEP for RT UE and RICE for pain relief.    Status On-going   PT LONG TERM GOAL #2   Title Pt. will report less than 2/10 pain with most ADLs, reaching.    Status Achieved   PT LONG TERM GOAL #3   Title Pt will be able to lift <10 lbs for home tasks, do dishes without pain increase   Status Achieved   PT LONG TERM GOAL #4   Title Pt will score <40% limited on FOTO to indicate improved functional uise of RT UE   Status Achieved               Plan - 08/21/14 1524    Clinical Impression Statement Patient coming to end of POC, may consider renewing patient if agreeable to dry needling, has plateaued to a degree.  No pain, but has definite scapular hypomobility.    PT Next Visit Plan renew vs DC, ask about sensory changes and check for m atrophy, ask about nerve damage?   Consulted and Agree with Plan of Care Patient        Problem  List Patient Active Problem List   Diagnosis Date Noted  . Osteopenia   . Osteoarthritis   . Hypertension   . Colon polyps     PAA,JENNIFER 08/21/2014, 3:27 PM  [facility] [address] [place], [place], [address] Phone: [phone]   Fax:  [phone]   Raeford Razor, PT 08/21/2014 3:27 PM Phone: [phone] Fax: [phone]

## 2014-08-22 ENCOUNTER — Encounter: Payer: Self-pay | Admitting: Physical Therapy

## 2014-08-22 NOTE — Therapy (Signed)
[place], [place], [address] Phone: [phone]   Fax:  [phone]  Patient Details  Name: Sherry Mitchell MRN: [id number] Date of Birth: 27-Jan-1946 Referring Provider:  No ref. provider found  Encounter Date: 08/22/2014  PT SHORT TERM GOAL #1    Title Pt will be I with HEP for Rt UE, posture   Status Achieved   PT SHORT TERM GOAL #2   Title Pt will be able to reach overhead to 110 deg with AAROM of LUE    Status Achieved   PT SHORT TERM GOAL #3   Title Pt will be more comfortable with sleep, improved 50% or more, without sling as MD allows.    Status Achieved   PT SHORT TERM GOAL #4   Title Pt will be able to don clothes, ADLs with min pain increase overall   Status Achieved             PT LONG TERM GOAL #1     Title  Pt will be I with HEP for RT UE and RICE for pain relief.     Status  On-going    PT LONG TERM GOAL #2    Title  Pt. will report less than 2/10 pain with most ADLs, reaching.     Status  Achieved    PT LONG TERM GOAL #3    Title  Pt will be able to lift <10 lbs for home tasks, do dishes without pain increase    Status  Achieved    PT LONG TERM GOAL #4    Title  Pt will score <40% limited on FOTO to indicate improved functional uise of RT UE    Status  Achieved        PHYSICAL THERAPY DISCHARGE SUMMARY  Visits from Start of Care: 17  Current functional level related to goals / functional outcomes: Goals met, see above   Remaining deficits: Scapular hypomobility, decr strength in Rt. UE (flexion, abd) and decr ROM, all planes. Pt is virtually pain free   Education / Equipment: HEP, posture, lifting, RICE Plan: Patient agrees to discharge.  Patient goals were met. Patient is being discharged due to meeting the stated rehab goals.  ?????    Patient called this AM to notify PT she feels ready for [name], PT 08/22/2014  11:27 AM Phone: [phone] Fax: [phone]   Byron Peacock 08/22/2014, 11:24 AM  [facility] [address] [place], [place], [address] Phone: [phone]   Fax:  [phone]

## 2014-08-28 ENCOUNTER — Encounter: Payer: Medicare HMO | Admitting: Physical Therapy

## 2014-09-04 ENCOUNTER — Encounter: Payer: Medicare HMO | Admitting: Physical Therapy

## 2014-09-11 ENCOUNTER — Encounter: Payer: Medicare HMO | Admitting: Physical Therapy

## 2014-09-11 ENCOUNTER — Telehealth: Payer: Self-pay | Admitting: Internal Medicine

## 2014-09-11 MED ORDER — LISINOPRIL-HYDROCHLOROTHIAZIDE 20-25 MG PO TABS: 1.0000 | ORAL_TABLET | Freq: Every day | ORAL | Status: DC

## 2014-09-11 MED ORDER — RALOXIFENE HCL 60 MG PO TABS: ORAL_TABLET | ORAL | Status: DC

## 2014-09-11 MED ORDER — DILTIAZEM HCL ER 240 MG PO CP24: 240.0000 mg | ORAL_CAPSULE | Freq: Every day | ORAL | Status: DC

## 2014-09-11 NOTE — Telephone Encounter (Signed)
erx done

## 2014-09-11 NOTE — Telephone Encounter (Signed)
Patient need a refill of Dilt-xr 240 mg, Raloxifene 60 mg, Lisinopril 20- 25 mg, send to [facility] on [address].

## 2014-09-18 ENCOUNTER — Encounter: Payer: Medicare HMO | Admitting: Physical Therapy

## 2014-11-28 ENCOUNTER — Ambulatory Visit (INDEPENDENT_AMBULATORY_CARE_PROVIDER_SITE_OTHER): Payer: Medicare HMO | Admitting: Internal Medicine

## 2014-11-28 ENCOUNTER — Other Ambulatory Visit (INDEPENDENT_AMBULATORY_CARE_PROVIDER_SITE_OTHER): Payer: Medicare HMO

## 2014-11-28 ENCOUNTER — Encounter: Payer: Self-pay | Admitting: Internal Medicine

## 2014-11-28 VITALS — BP 122/72 | HR 80 | Temp 99.9°F | Ht 67.0 in | Wt 153.2 lb

## 2014-11-28 DIAGNOSIS — M609 Myositis, unspecified: Secondary | ICD-10-CM | POA: Diagnosis not present

## 2014-11-28 DIAGNOSIS — M791 Myalgia: Secondary | ICD-10-CM

## 2014-11-28 DIAGNOSIS — R509 Fever, unspecified: Secondary | ICD-10-CM | POA: Diagnosis not present

## 2014-11-28 DIAGNOSIS — R5383 Other fatigue: Secondary | ICD-10-CM | POA: Diagnosis not present

## 2014-11-28 DIAGNOSIS — IMO0001 Reserved for inherently not codable concepts without codable children: Secondary | ICD-10-CM

## 2014-11-28 LAB — URINALYSIS, ROUTINE W REFLEX MICROSCOPIC
Bilirubin Urine: NEGATIVE
Hgb urine dipstick: NEGATIVE
KETONES UR: NEGATIVE
LEUKOCYTES UA: NEGATIVE
NITRITE: NEGATIVE
PH: 6.5 (ref 5.0–8.0)
SPECIFIC GRAVITY, URINE: 1.01 (ref 1.000–1.030)
TOTAL PROTEIN, URINE-UPE24: NEGATIVE
URINE GLUCOSE: NEGATIVE
Urobilinogen, UA: 0.2 (ref 0.0–1.0)

## 2014-11-28 LAB — HEPATIC FUNCTION PANEL
ALT: 38 U/L — ABNORMAL HIGH (ref 0–35)
AST: 40 U/L — ABNORMAL HIGH (ref 0–37)
Albumin: 4.2 g/dL (ref 3.5–5.2)
Alkaline Phosphatase: 102 U/L (ref 39–117)
BILIRUBIN TOTAL: 0.5 mg/dL (ref 0.2–1.2)
Bilirubin, Direct: 0.1 mg/dL (ref 0.0–0.3)
Total Protein: 7.6 g/dL (ref 6.0–8.3)

## 2014-11-28 LAB — CBC WITH DIFFERENTIAL/PLATELET
BASOS ABS: 0.1 10*3/uL (ref 0.0–0.1)
Basophils Relative: 0.8 % (ref 0.0–3.0)
Eosinophils Absolute: 0.2 10*3/uL (ref 0.0–0.7)
Eosinophils Relative: 2.7 % (ref 0.0–5.0)
HCT: 43.2 % (ref 36.0–46.0)
Hemoglobin: 14.6 g/dL (ref 12.0–15.0)
LYMPHS ABS: 2.3 10*3/uL (ref 0.7–4.0)
Lymphocytes Relative: 28.3 % (ref 12.0–46.0)
MCHC: 33.7 g/dL (ref 30.0–36.0)
MCV: 89.8 fl (ref 78.0–100.0)
MONO ABS: 1.1 10*3/uL — AB (ref 0.1–1.0)
MONOS PCT: 12.8 % — AB (ref 3.0–12.0)
NEUTROS ABS: 4.6 10*3/uL (ref 1.4–7.7)
NEUTROS PCT: 55.4 % (ref 43.0–77.0)
PLATELETS: 241 10*3/uL (ref 150.0–400.0)
RBC: 4.81 Mil/uL (ref 3.87–5.11)
RDW: 13.8 % (ref 11.5–15.5)
WBC: 8.3 10*3/uL (ref 4.0–10.5)

## 2014-11-28 LAB — BASIC METABOLIC PANEL
BUN: 9 mg/dL (ref 6–23)
CO2: 29 meq/L (ref 19–32)
Calcium: 9.3 mg/dL (ref 8.4–10.5)
Chloride: 97 mEq/L (ref 96–112)
Creatinine, Ser: 0.69 mg/dL (ref 0.40–1.20)
GFR: 89.58 mL/min (ref >60.00)
GLUCOSE: 154 mg/dL — AB (ref 70–99)
Potassium: 3.5 mEq/L (ref 3.5–5.1)
SODIUM: 134 meq/L — AB (ref 135–145)

## 2014-11-28 LAB — POCT INFLUENZA A/B
Influenza A, POC: NEGATIVE
Influenza B, POC: NEGATIVE

## 2014-11-28 NOTE — Patient Instructions (Addendum)
It was good to see you today.  We have reviewed your prior records including labs and tests today  Test(s) ordered today. Your results will be released to [facility] (or called to you) after review, usually within 72hours after test completion. If any changes need to be made, you will be notified at that same time.  Medications reviewed and updated Continue alternating Tylenol with ibuprofen for fever as needed No other changes recommended at this time -no indication for antibody, identify other specific bacterial disease  If persisting fever symptoms greater than 2 weeks, or if worsening symptoms otherwise manifest, please contact us for further testing and evaluation as needed Otherwise, this is likely a viral syndrome which will be self-limited (<2 weeks) and will resolve without other specific medical treatment  Please keep scheduled visit for annual exam, call sooner if problems.   Viral Infections A viral infection can be caused by different types of viruses.Most viral infections are not serious and resolve on their own. However, some infections may cause severe symptoms and may lead to further complications. SYMPTOMS Viruses can frequently cause:  Minor sore throat.  Aches and pains.  Headaches.  Runny nose.  Different types of rashes.  Watery eyes.  Tiredness.  Cough.  Loss of appetite.  Gastrointestinal infections, resulting in nausea, vomiting, and diarrhea. These symptoms do not respond to antibiotics because the infection is not caused by bacteria. However, you might catch a bacterial infection following the viral infection. This is sometimes called a "superinfection." Symptoms of such a bacterial infection may include:  Worsening sore throat with pus and difficulty swallowing.  Swollen neck glands.  Chills and a high or persistent fever.  Severe headache.  Tenderness over the sinuses.  Persistent overall ill feeling (malaise), muscle aches, and tiredness  (fatigue).  Persistent cough.  Yellow, green, or brown mucus production with coughing. HOME CARE INSTRUCTIONS   Only take over-the-counter or prescription medicines for pain, discomfort, diarrhea, or fever as directed by your caregiver.  Drink enough water and fluids to keep your urine clear or pale yellow. Sports drinks can provide valuable electrolytes, sugars, and hydration.  Get plenty of rest and maintain proper nutrition. Soups and broths with crackers or rice are fine. SEEK IMMEDIATE MEDICAL CARE IF:   You have severe headaches, shortness of breath, chest pain, neck pain, or an unusual rash.  You have uncontrolled vomiting, diarrhea, or you are unable to keep down fluids.  You or your child has an oral temperature above 102 F (38.9 C), not controlled by medicine.  Your baby is older than 3 months with a rectal temperature of 102 F (38.9 C) or higher.  Your baby is 39 months old or younger with a rectal temperature of 100.4 F (38 C) or higher. MAKE SURE YOU:   Understand these instructions.  Will watch your condition.  Will get help right away if you are not doing well or get worse. Document Released: 12/16/2004 Document Revised: 05/31/2011 Document Reviewed: 07/13/2010 [facility] Patient Information 2015 [place], [place]. This information is not intended to replace advice given to you by your health care provider. Make sure you discuss any questions you have with your health care provider.

## 2014-11-28 NOTE — Progress Notes (Signed)
Subjective:    Patient ID: Sherry Mitchell, female    DOB: 06-03-1945, 69 y.o.   MRN: [id number]  Fever  This is a new problem. The current episode started in the past 7 days. The problem occurs daily. The problem has been waxing and waning. The maximum temperature noted was 99 to 99.9 F. The temperature was taken using an oral thermometer. Associated symptoms include muscle aches. Pertinent negatives include no abdominal pain, chest pain, congestion, coughing, diarrhea, ear pain, headaches, nausea, rash, sleepiness, urinary pain, vomiting or wheezing. Sore throat: mild. She has tried NSAIDs for the symptoms. The treatment provided moderate relief.   No travel, no outdoor exposure or tick bites, no sick contacts - no new meds or foods  Past Medical History  Diagnosis Date  . Hypertension   . Colon polyps 02/2003, 02/2009    Adenomatous polyps  . Osteopenia     DEXA 02/2011, 02/2013: -1.9  . Osteoarthritis     hands    Review of Systems  Constitutional: Positive for fever.  HENT: Negative for congestion and ear pain. Sore throat: mild.   Respiratory: Negative for cough and wheezing.   Cardiovascular: Negative for chest pain.  Gastrointestinal: Negative for nausea, vomiting, abdominal pain and diarrhea.  Genitourinary: Negative for dysuria.  Skin: Negative for rash.  Neurological: Negative for headaches.       Objective:    Physical Exam  Constitutional: She appears well-developed and well-nourished. No distress.  Cardiovascular: Normal rate, regular rhythm and normal heart sounds.   No murmur heard. Pulmonary/Chest: Effort normal and breath sounds normal. No respiratory distress.  Musculoskeletal: She exhibits no edema.    BP 122/72 mmHg  Pulse 80  Temp(Src) 99.9 F (37.7 C) (Oral)  Ht 5\' 7"  (1.702 m)  Wt 153 lb 4 oz (69.514 kg)  BMI 24.00 kg/m2  SpO2 98% Wt Readings from Last 3 Encounters:  11/28/14 153 lb 4 oz (69.514 kg)  03/10/14 147 lb (66.679 kg)  03/05/14  149 lb (67.586 kg)    Lab Results  Component Value Date   WBC 8.9 03/10/2014   HGB 13.7 03/10/2014   HCT 40.2 03/10/2014   PLT 271 03/10/2014   GLUCOSE 131* 03/10/2014   CHOL 195 03/04/2014   TRIG 75.0 03/04/2014   HDL 64.60 03/04/2014   LDLDIRECT 125.1 03/01/2013   LDLCALC 115* 03/04/2014   ALT 15 03/04/2014   AST 16 03/04/2014   NA 139 03/10/2014   K 3.5* 03/10/2014   CL 97 03/10/2014   CREATININE 0.54 03/10/2014   BUN 14 03/10/2014   CO2 24 03/10/2014   TSH 0.88 03/04/2014    Mr Shoulder Right Wo Contrast  04/05/2014   CLINICAL DATA:  Right shoulder weakness since falling and dislocating shoulder Mar 10, 2014.  EXAM: MRI OF THE RIGHT SHOULDER WITHOUT CONTRAST  TECHNIQUE: Multiplanar, multisequence MR imaging of the shoulder was performed. No intravenous contrast was administered.  COMPARISON:  None.  FINDINGS: Rotator cuff: Complete tear of the supraspinatus tendon with 4.3 cm of retraction. Severe tendinosis of the infraspinatus tendon with full-thickness tear of the anterior half of the infraspinatus tendon. Teres minor tendon is intact. Moderate tendinosis of the subscapularis tendon.  Muscles: Severe atrophy of the infraspinatus muscle. Mild atrophy of the supraspinatus muscle. No subscapularis or teres minor muscle atrophy.  Biceps long head: Severe tendinosis of the intraarticular portion of the biceps tendon.  Acromioclavicular Joint: Moderate degenerative changes of the acromioclavicular joint. Type I acromion. Moderate amount of  subacromial/subdeltoid bursal fluid with low signal foci within the a fluid which may reflect debris or synovitis.  Glenohumeral Joint: Small joint effusion. Mild chondromalacia of the glenohumeral joint. Attenuation and edema of the inferior glenohumeral ligament most concerning for humeral avulsion of the glenohumeral ligament.  Labrum: Grossly intact, but evaluation is limited by lack of intraarticular fluid.  Bones: Mild edema in the superolateral  humeral head likely reflecting Hill-Sachs lesion. No acute fracture or dislocation.  IMPRESSION: 1. Complete tear of supraspinatus tendon with  4.3 cm of retraction. 2. Severe tendinosis of the infraspinatus tendon with a full-thickness tear of the anterior half of the infraspinatus tendon. Severe atrophy of the infraspinatus muscle. 3. Moderate tendinosis of the subscapularis tendon. 4. Severe tenderness of the intraarticular portion of the biceps tendon. 5. Findings concerning for humeral avulsion of the inferior glenohumeral ligament. 6. Mild edema in the superolateral humeral head likely reflecting a Hill-Sachs lesion given the history of recent dislocation. No Bankart lesion.   Electronically Signed   By: Kathreen Devoid   On: 04/05/2014 09:45       Assessment & Plan:   LGF x 1 week - associated with myalgias but no other localizing symptoms. Will check Monospot to rule out EBV and influenza swab today performed and negative. Check for labs including urinalysis to rule out infection. Informed patient I'll send him a complete resolved symptomatically care including altering Tylenol and ibuprofen. If symptoms persist greater 2 weeks, her symptoms worsening. Patient will contact us for further evaluation testing as needed  Problem List Items Addressed This Visit    None    Visit Diagnoses    Low grade fever    -  Primary    Relevant Orders    Basic metabolic panel    CBC with Differential/Platelet    Hepatic function panel    Urinalysis, Routine w reflex microscopic (not at [facility])    Myalgia and myositis        Relevant Orders    Basic metabolic panel    CBC with Differential/Platelet    Hepatic function panel    Urinalysis, Routine w reflex microscopic (not at [facility])    Other fatigue        Relevant Orders    Basic metabolic panel    CBC with Differential/Platelet    Hepatic function panel    Urinalysis, Routine w reflex microscopic (not at [facility])        Gwendolyn Grant, MD

## 2014-11-28 NOTE — Progress Notes (Signed)
Pre visit review using our clinic review tool, if applicable. No additional management support is needed unless otherwise documented below in the visit note. 

## 2014-11-29 LAB — MONONUCLEOSIS SCREEN: Mono Screen: NEGATIVE

## 2014-12-02 ENCOUNTER — Telehealth: Payer: Self-pay | Admitting: Internal Medicine

## 2014-12-02 NOTE — Telephone Encounter (Signed)
Patient returned your call regarding her lab results. ?

## 2014-12-06 ENCOUNTER — Other Ambulatory Visit: Payer: Medicare HMO

## 2014-12-06 ENCOUNTER — Other Ambulatory Visit (INDEPENDENT_AMBULATORY_CARE_PROVIDER_SITE_OTHER): Payer: Medicare HMO

## 2014-12-06 ENCOUNTER — Ambulatory Visit (INDEPENDENT_AMBULATORY_CARE_PROVIDER_SITE_OTHER): Payer: Medicare HMO | Admitting: Family

## 2014-12-06 ENCOUNTER — Encounter: Payer: Self-pay | Admitting: Family

## 2014-12-06 VITALS — BP 114/70 | HR 73 | Temp 98.2°F | Resp 18 | Ht 67.0 in | Wt 153.0 lb

## 2014-12-06 DIAGNOSIS — R509 Fever, unspecified: Secondary | ICD-10-CM

## 2014-12-06 LAB — CBC WITH DIFFERENTIAL/PLATELET
BASOS ABS: 0.1 10*3/uL (ref 0.0–0.1)
Basophils Relative: 0.6 % (ref 0.0–3.0)
Eosinophils Absolute: 0 10*3/uL (ref 0.0–0.7)
Eosinophils Relative: 0.3 % (ref 0.0–5.0)
HEMATOCRIT: 37.9 % (ref 36.0–46.0)
HEMOGLOBIN: 12.8 g/dL (ref 12.0–15.0)
Lymphs Abs: 8 10*3/uL — ABNORMAL HIGH (ref 0.7–4.0)
MCHC: 33.7 g/dL (ref 30.0–36.0)
MCV: 88.4 fl (ref 78.0–100.0)
MONOS PCT: 10.2 % (ref 3.0–12.0)
Monocytes Absolute: 1.3 10*3/uL — ABNORMAL HIGH (ref 0.1–1.0)
Neutro Abs: 3.7 10*3/uL (ref 1.4–7.7)
Neutrophils Relative %: 28.1 % — ABNORMAL LOW (ref 43.0–77.0)
Platelets: 266 10*3/uL (ref 150.0–400.0)
RBC: 4.29 Mil/uL (ref 3.87–5.11)
RDW: 13.7 % (ref 11.5–15.5)
WBC: 13.1 10*3/uL — AB (ref 4.0–10.5)

## 2014-12-06 LAB — C-REACTIVE PROTEIN: CRP: 4.3 mg/dL (ref 0.5–20.0)

## 2014-12-06 LAB — TSH: TSH: 1 u[IU]/mL (ref 0.35–4.50)

## 2014-12-06 LAB — SEDIMENTATION RATE: Sed Rate: 25 mm/hr — ABNORMAL HIGH (ref 0–22)

## 2014-12-06 MED ORDER — DOXYCYCLINE HYCLATE 100 MG PO TABS: 100.0000 mg | ORAL_TABLET | Freq: Two times a day (BID) | ORAL | Status: DC

## 2014-12-06 NOTE — Patient Instructions (Signed)
Thank you for choosing [facility].  Summary/Instructions:  Your prescription(s) have been submitted to your pharmacy or been printed and provided for you. Please take as directed and contact our office if you believe you are having problem(s) with the medication(s) or have any questions.  Please stop by the lab on the basement level of the building for your blood work. Your results will be released to [facility] (or called to you) after review, usually within 72 hours after test completion. If any changes need to be made, you will be notified at that same time.  If your symptoms worsen or fail to improve, please contact our office for further instruction, or in case of emergency go directly to the emergency room at the closest medical facility.   Fever, Adult A fever is a higher than normal body temperature. In an adult, an oral temperature around 98.6 F (37 C) is considered normal. A temperature of 100.4 F (38 C) or higher is generally considered a fever. Mild or moderate fevers generally have no long-term effects and often do not require treatment. Extreme fever (greater than or equal to 106 F or 41.1 C) can cause seizures. The sweating that may occur with repeated or prolonged fever may cause dehydration. Elderly people can develop confusion during a fever. A measured temperature can vary with:  Age.  Time of day.  Method of measurement (mouth, underarm, rectal, or ear). The fever is confirmed by taking a temperature with a thermometer. Temperatures can be taken different ways. Some methods are accurate and some are not.  An oral temperature is used most commonly. Electronic thermometers are fast and accurate.  An ear temperature will only be accurate if the thermometer is positioned as recommended by the manufacturer.  A rectal temperature is accurate and done for those adults who have a condition where an oral temperature cannot be taken.  An underarm (axillary) temperature  is not accurate and not recommended. Fever is a symptom, not a disease.  CAUSES   Infections commonly cause fever.  Some noninfectious causes for fever include:  Some arthritis conditions.  Some thyroid or adrenal gland conditions.  Some immune system conditions.  Some types of cancer.  A medicine reaction.  High doses of certain street drugs such as methamphetamine.  Dehydration.  Exposure to high outside or room temperatures.  Occasionally, the source of a fever cannot be determined. This is sometimes called a "fever of unknown origin" (FUO).  Some situations may lead to a temporary rise in body temperature that may go away on its own. Examples are:  Childbirth.  Surgery.  Intense exercise. HOME CARE INSTRUCTIONS   Take appropriate medicines for fever. Follow dosing instructions carefully. If you use acetaminophen to reduce the fever, be careful to avoid taking other medicines that also contain acetaminophen. Do not take aspirin for a fever if you are younger than age 71. There is an association with Reye's syndrome. Reye's syndrome is a rare but potentially deadly disease.  If an infection is present and antibiotics have been prescribed, take them as directed. Finish them even if you start to feel better.  Rest as needed.  Maintain an adequate fluid intake. To prevent dehydration during an illness with prolonged or recurrent fever, you may need to drink extra fluid.Drink enough fluids to keep your urine clear or pale yellow.  Sponging or bathing with room temperature water may help reduce body temperature. Do not use ice water or alcohol sponge baths.  Dress comfortably, but  do not over-bundle. SEEK MEDICAL CARE IF:   You are unable to keep fluids down.  You develop vomiting or diarrhea.  You are not feeling at least partly better after 3 days.  You develop new symptoms or problems. SEEK IMMEDIATE MEDICAL CARE IF:   You have shortness of breath or trouble  breathing.  You develop excessive weakness.  You are dizzy or you faint.  You are extremely thirsty or you are making little or no urine.  You develop new pain that was not there before (such as in the head, neck, chest, back, or abdomen).  You have persistent vomiting and diarrhea for more than 1 to 2 days.  You develop a stiff neck or your eyes become sensitive to light.  You develop a skin rash.  You have a fever or persistent symptoms for more than 2 to 3 days.  You have a fever and your symptoms suddenly get worse. MAKE SURE YOU:   Understand these instructions.  Will watch your condition.  Will get help right away if you are not doing well or get worse. Document Released: 09/01/2000 Document Revised: 07/23/2013 Document Reviewed: 01/07/2011 [facility] Patient Information 2015 [place], [place]. This information is not intended to replace advice given to you by your health care provider. Make sure you discuss any questions you have with your health care provider.

## 2014-12-06 NOTE — Progress Notes (Signed)
Subjective:    Patient ID: Sherry Mitchell, female    DOB: Jan 20, 1946, 69 y.o.   MRN: [id number]  Chief Complaint  Patient presents with  . Fever    has had and on and off fever, this morning it was 102 and then she took it again an hour later its back to normal, no other sxs associated, she can tell when the fever comes on     HPI:  Sherry Mitchell is a 69 y.o. female with a PMH of hypertension, colon polyps, and osteoarthritis who presents today for an acute office visit.  This is a new problem. Associated symptoms of fevers that wax and wane with no other symptoms have been going on for approximately 2 weeks and was previously seen in the office for the fever and blood work showed no siginificant findings. Notes the fever to be sporadic and comes and goes with this morning around 102 for which she took ibuprofen which brought her fever down. Denies any other cold or illness related symptoms.   No Known Allergies  Current Outpatient Prescriptions on File Prior to Visit  Medication Sig Dispense Refill  . diltiazem (DILT-XR) 240 MG 24 hr capsule Take 1 capsule (240 mg total) by mouth daily. 90 capsule 3  . lisinopril-hydrochlorothiazide (PRINZIDE,ZESTORETIC) 20-25 MG per tablet Take 1 tablet by mouth daily. 90 tablet 3  . raloxifene (EVISTA) 60 MG tablet Take 1 tablet by mouth daily 90 tablet 3   No current facility-administered medications on file prior to visit.    Review of Systems  Constitutional: Positive for fever and chills.  HENT: Negative for congestion and sore throat.   Respiratory: Negative for choking, chest tightness and shortness of breath.   Cardiovascular: Negative for chest pain.  Neurological: Negative for headaches.      Objective:    BP 114/70 mmHg  Pulse 73  Temp(Src) 98.2 F (36.8 C) (Oral)  Resp 18  Ht '5\' 7"'  (1.702 m)  Wt 153 lb (69.4 kg)  BMI 23.96 kg/m2  SpO2 98% Nursing note and vital signs reviewed.  Physical Exam  Constitutional: She  is oriented to person, place, and time. She appears well-developed and well-nourished. No distress.  HENT:  Right Ear: Hearing, tympanic membrane, external ear and ear canal normal.  Left Ear: Hearing, tympanic membrane, external ear and ear canal normal.  Nose: Right sinus exhibits no maxillary sinus tenderness and no frontal sinus tenderness. Left sinus exhibits no maxillary sinus tenderness and no frontal sinus tenderness.  Mouth/Throat: Uvula is midline, oropharynx is clear and moist and mucous membranes are normal.  Cardiovascular: Normal rate, regular rhythm, normal heart sounds and intact distal pulses.   Pulmonary/Chest: Effort normal and breath sounds normal.  Neurological: She is alert and oriented to person, place, and time.  Skin: Skin is warm and dry.  Psychiatric: She has a normal mood and affect. Her behavior is normal. Judgment and thought content normal.       Assessment & Plan:   Problem List Items Addressed This Visit      Other   Fever of unknown origin (FUO) - Primary    Continues to experience mild fevers with a temperature maximum of 102 with no other symptoms. Previous blood work was negative for infection. Obtain TSH, ESR, CBC with differential, C-reactive protein, and antinuclear antibody. Start doxycycline to treat empirically for potential infection. Follow-up if symptoms worsen or do not improve with treatment.      Relevant Medications  doxycycline (VIBRA-TABS) 100 MG tablet   Other Relevant Orders   Antinuclear Antib (ANA)   Sed Rate (ESR)   CBC w/Diff   C-reactive protein   TSH

## 2014-12-06 NOTE — Assessment & Plan Note (Signed)
Continues to experience mild fevers with a temperature maximum of 102 with no other symptoms. Previous blood work was negative for infection. Obtain TSH, ESR, CBC with differential, C-reactive protein, and antinuclear antibody. Start doxycycline to treat empirically for potential infection. Follow-up if symptoms worsen or do not improve with treatment.

## 2014-12-06 NOTE — Progress Notes (Signed)
Pre visit review using our clinic review tool, if applicable. No additional management support is needed unless otherwise documented below in the visit note. 

## 2014-12-09 LAB — ANA: Anti Nuclear Antibody(ANA): POSITIVE — AB

## 2014-12-09 LAB — PATHOLOGIST SMEAR REVIEW

## 2014-12-09 LAB — ANTI-NUCLEAR AB-TITER (ANA TITER): ANA TITER 1: NEGATIVE (ref <1:40)

## 2015-01-20 ENCOUNTER — Ambulatory Visit (INDEPENDENT_AMBULATORY_CARE_PROVIDER_SITE_OTHER): Payer: Medicare HMO | Admitting: Internal Medicine

## 2015-01-20 ENCOUNTER — Encounter: Payer: Self-pay | Admitting: Internal Medicine

## 2015-01-20 ENCOUNTER — Other Ambulatory Visit (INDEPENDENT_AMBULATORY_CARE_PROVIDER_SITE_OTHER): Payer: Medicare HMO

## 2015-01-20 VITALS — BP 128/80 | HR 69 | Temp 98.6°F | Ht 67.0 in | Wt 145.8 lb

## 2015-01-20 DIAGNOSIS — R351 Nocturia: Secondary | ICD-10-CM

## 2015-01-20 DIAGNOSIS — R358 Other polyuria: Secondary | ICD-10-CM | POA: Diagnosis not present

## 2015-01-20 DIAGNOSIS — R739 Hyperglycemia, unspecified: Secondary | ICD-10-CM

## 2015-01-20 DIAGNOSIS — R3589 Other polyuria: Secondary | ICD-10-CM

## 2015-01-20 LAB — URINALYSIS
BILIRUBIN URINE: NEGATIVE
Hgb urine dipstick: NEGATIVE
Ketones, ur: NEGATIVE
LEUKOCYTES UA: NEGATIVE
Nitrite: NEGATIVE
PH: 6.5 (ref 5.0–8.0)
Specific Gravity, Urine: <=1.005 — AB (ref 1.000–1.030)
Total Protein, Urine: NEGATIVE
Urine Glucose: NEGATIVE
Urobilinogen, UA: 0.2 (ref 0.0–1.0)

## 2015-01-20 LAB — HEMOGLOBIN A1C: HEMOGLOBIN A1C: 5.8 % (ref 4.6–6.5)

## 2015-01-20 NOTE — Patient Instructions (Signed)
  Your next office appointment will be determined based upon review of your pending labs  and  cultures Those written interpretation of the lab results and instructions will be transmitted to you by My Chart   Critical results will be called.   Followup as needed for any active or acute issue. Please report any significant change in your symptoms.

## 2015-01-20 NOTE — Progress Notes (Signed)
   Subjective:    Patient ID: Sherry Mitchell, female    DOB: [birth year], 69 y.o.   MRN: [id number]  HPI For several days she describes nocturia up to 4 times a night. She also has had polyuria. She has not treated this.  There is no history of diabetes in her or here family  or genitourinary health issues. Random glucoses have varied from 99-154.   Review of Systems  She denies dysuria, pyuria, polyphagia, polydipsia. She denies fever, chills, or sweats. There've been no rashes or lesions. Unexplained weight loss, abdominal pain, significant dyspepsia, dysphagia, melena, rectal bleeding, or persistently small caliber stools are denied.     Objective:   Physical Exam  Pertinent or positive findings include: She has crepitus of the knees. She has negative straight leg raising to 90.  General appearance :adequately nourished; in no distress.  Eyes: No conjunctival inflammation or scleral icterus is present.  Heart:  Normal rate and regular rhythm. S1 and S2 normal without gallop, murmur, click, rub or other extra sounds    Lungs:Chest clear to auscultation; no wheezes, rhonchi,rales ,or rubs present.No increased work of breathing.   Abdomen: bowel sounds normal, soft and non-tender without masses, organomegaly or hernias noted.  No guarding or rebound. No flank tenderness to percussion.  Vascular : all pulses equal ; no bruits present.  Skin:Warm & dry.  Intact without suspicious lesions or rashes ; no tenting or jaundice   Lymphatic: No lymphadenopathy is noted about the head, neck, axilla.   Neuro: Strength, tone & DTRs normal.      Assessment & Plan:  #1 nocturia   #2 polyuria  #3 hyperglycemia  See orders

## 2015-01-20 NOTE — Progress Notes (Signed)
Pre visit review using our clinic review tool, if applicable. No additional management support is needed unless otherwise documented below in the visit note. 

## 2015-01-21 LAB — CULTURE, URINE COMPREHENSIVE
Colony Count: NO GROWTH
ORGANISM ID, BACTERIA: NO GROWTH

## 2015-02-03 ENCOUNTER — Encounter: Payer: Self-pay | Admitting: Gastroenterology

## 2015-02-07 ENCOUNTER — Encounter: Payer: Self-pay | Admitting: Internal Medicine

## 2015-03-06 ENCOUNTER — Ambulatory Visit (INDEPENDENT_AMBULATORY_CARE_PROVIDER_SITE_OTHER): Payer: Medicare HMO | Admitting: Internal Medicine

## 2015-03-06 ENCOUNTER — Encounter: Payer: Medicare HMO | Admitting: Internal Medicine

## 2015-03-06 ENCOUNTER — Encounter: Payer: Self-pay | Admitting: Internal Medicine

## 2015-03-06 VITALS — BP 134/84 | HR 62 | Temp 97.7°F | Resp 16 | Ht 67.0 in | Wt 147.0 lb

## 2015-03-06 DIAGNOSIS — Z23 Encounter for immunization: Secondary | ICD-10-CM

## 2015-03-06 DIAGNOSIS — I10 Essential (primary) hypertension: Secondary | ICD-10-CM

## 2015-03-06 DIAGNOSIS — M8580 Other specified disorders of bone density and structure, unspecified site: Secondary | ICD-10-CM | POA: Diagnosis not present

## 2015-03-06 DIAGNOSIS — Z0000 Encounter for general adult medical examination without abnormal findings: Secondary | ICD-10-CM

## 2015-03-06 NOTE — Patient Instructions (Signed)
We have reviewed your prior records including labs and tests today.  Test(s) ordered today. Your results will be released to [place] (or called to you) after review, usually within 72hours after test completion. If any changes need to be made, you will be notified at that same time.  All other Health Maintenance issues reviewed.   All recommended immunizations and age-appropriate screenings are up-to-date.  Prevnar vaccine administered today.   Medications reviewed and updated.    No changes recommended at this time.  Please followup annually    Health Maintenance, Female Adopting a healthy lifestyle and getting preventive care can go a long way to promote health and wellness. Talk with your health care provider about what schedule of regular examinations is right for you. This is a good chance for you to check in with your provider about disease prevention and staying healthy. In between checkups, there are plenty of things you can do on your own. Experts have done a lot of research about which lifestyle changes and preventive measures are most likely to keep you healthy. Ask your health care provider for more information. WEIGHT AND DIET  Eat a healthy diet  Be sure to include plenty of vegetables, fruits, low-fat dairy products, and lean protein.  Do not eat a lot of foods high in solid fats, added sugars, or salt.  Get regular exercise. This is one of the most important things you can do for your health.  Most adults should exercise for at least 150 minutes each week. The exercise should increase your heart rate and make you sweat (moderate-intensity exercise).  Most adults should also do strengthening exercises at least twice a week. This is in addition to the moderate-intensity exercise.  Maintain a healthy weight  Body mass index (BMI) is a measurement that can be used to identify possible weight problems. It estimates body fat based on height and weight. Your health care  provider can help determine your BMI and help you achieve or maintain a healthy weight.  For females 71 years of age and older:   A BMI below 18.5 is considered underweight.  A BMI of 18.5 to 24.9 is normal.  A BMI of 25 to 29.9 is considered overweight.  A BMI of 30 and above is considered obese.  Watch levels of cholesterol and blood lipids  You should start having your blood tested for lipids and cholesterol at 69 years of age, then have this test every 5 years.  You may need to have your cholesterol levels checked more often if:  Your lipid or cholesterol levels are high.  You are older than 69 years of age.  You are at high risk for heart disease.  CANCER SCREENING   Lung Cancer  Lung cancer screening is recommended for adults 48-58 years old who are at high risk for lung cancer because of a history of smoking.  A yearly low-dose CT scan of the lungs is recommended for people who:  Currently smoke.  Have quit within the past 15 years.  Have at least a 30-pack-year history of smoking. A pack year is smoking an average of one pack of cigarettes a day for 1 year.  Yearly screening should continue until it has been 15 years since you quit.  Yearly screening should stop if you develop a health problem that would prevent you from having lung cancer treatment.  Breast Cancer  Practice breast self-awareness. This means understanding how your breasts normally appear and feel.  It also  means doing regular breast self-exams. Let your health care provider know about any changes, no matter how small.  If you are in your 20s or 30s, you should have a clinical breast exam (CBE) by a health care provider every 1-3 years as part of a regular health exam.  If you are 53 or older, have a CBE every year. Also consider having a breast X-ray (mammogram) every year.  If you have a family history of breast cancer, talk to your health care provider about genetic screening.  If you  are at high risk for breast cancer, talk to your health care provider about having an MRI and a mammogram every year.  Breast cancer gene (BRCA) assessment is recommended for women who have family members with BRCA-related cancers. BRCA-related cancers include:  Breast.  Ovarian.  Tubal.  Peritoneal cancers.  Results of the assessment will determine the need for genetic counseling and BRCA1 and BRCA2 testing. Cervical Cancer Your health care provider may recommend that you be screened regularly for cancer of the pelvic organs (ovaries, uterus, and vagina). This screening involves a pelvic examination, including checking for microscopic changes to the surface of your cervix (Pap test). You may be encouraged to have this screening done every 3 years, beginning at age 30.  For women ages 74-65, health care providers may recommend pelvic exams and Pap testing every 3 years, or they may recommend the Pap and pelvic exam, combined with testing for human papilloma virus (HPV), every 5 years. Some types of HPV increase your risk of cervical cancer. Testing for HPV may also be done on women of any age with unclear Pap test results.  Other health care providers may not recommend any screening for nonpregnant women who are considered low risk for pelvic cancer and who do not have symptoms. Ask your health care provider if a screening pelvic exam is right for you.  If you have had past treatment for cervical cancer or a condition that could lead to cancer, you need Pap tests and screening for cancer for at least 20 years after your treatment. If Pap tests have been discontinued, your risk factors (such as having a new sexual partner) need to be reassessed to determine if screening should resume. Some women have medical problems that increase the chance of getting cervical cancer. In these cases, your health care provider may recommend more frequent screening and Pap tests. Colorectal Cancer  This type of  cancer can be detected and often prevented.  Routine colorectal cancer screening usually begins at 69 years of age and continues through 69 years of age.  Your health care provider may recommend screening at an earlier age if you have risk factors for colon cancer.  Your health care provider may also recommend using home test kits to check for hidden blood in the stool.  A small camera at the end of a tube can be used to examine your colon directly (sigmoidoscopy or colonoscopy). This is done to check for the earliest forms of colorectal cancer.  Routine screening usually begins at age 95.  Direct examination of the colon should be repeated every 5-10 years through 69 years of age. However, you may need to be screened more often if early forms of precancerous polyps or small growths are found. Skin Cancer  Check your skin from head to toe regularly.  Tell your health care provider about any new moles or changes in moles, especially if there is a change in a mole's shape  or color.  Also tell your health care provider if you have a mole that is larger than the size of a pencil eraser.  Always use sunscreen. Apply sunscreen liberally and repeatedly throughout the day.  Protect yourself by wearing long sleeves, pants, a wide-brimmed hat, and sunglasses whenever you are outside. HEART DISEASE, DIABETES, AND HIGH BLOOD PRESSURE   High blood pressure causes heart disease and increases the risk of stroke. High blood pressure is more likely to develop in:  People who have blood pressure in the high end of the normal range (130-139/85-89 mm Hg).  People who are overweight or obese.  People who are African American.  If you are 34-59 years of age, have your blood pressure checked every 3-5 years. If you are 6 years of age or older, have your blood pressure checked every year. You should have your blood pressure measured twice--once when you are at a hospital or clinic, and once when you are  not at a hospital or clinic. Record the average of the two measurements. To check your blood pressure when you are not at a hospital or clinic, you can use:  An automated blood pressure machine at a pharmacy.  A home blood pressure monitor.  If you are between 7 years and 59 years old, ask your health care provider if you should take aspirin to prevent strokes.  Have regular diabetes screenings. This involves taking a blood sample to check your fasting blood sugar level.  If you are at a normal weight and have a low risk for diabetes, have this test once every three years after 69 years of age.  If you are overweight and have a high risk for diabetes, consider being tested at a younger age or more often. PREVENTING INFECTION  Hepatitis B  If you have a higher risk for hepatitis B, you should be screened for this virus. You are considered at high risk for hepatitis B if:  You were born in a country where hepatitis B is common. Ask your health care provider which countries are considered high risk.  Your parents were born in a high-risk country, and you have not been immunized against hepatitis B (hepatitis B vaccine).  You have HIV or AIDS.  You use needles to inject street drugs.  You live with someone who has hepatitis B.  You have had sex with someone who has hepatitis B.  You get hemodialysis treatment.  You take certain medicines for conditions, including cancer, organ transplantation, and autoimmune conditions. Hepatitis C  Blood testing is recommended for:  Everyone born from [birth year] through 1965.  Anyone with known risk factors for hepatitis C. Sexually transmitted infections (STIs)  You should be screened for sexually transmitted infections (STIs) including gonorrhea and chlamydia if:  You are sexually active and are younger than 69 years of age.  You are older than 69 years of age and your health care provider tells you that you are at risk for this type of  infection.  Your sexual activity has changed since you were last screened and you are at an increased risk for chlamydia or gonorrhea. Ask your health care provider if you are at risk.  If you do not have HIV, but are at risk, it may be recommended that you take a prescription medicine daily to prevent HIV infection. This is called pre-exposure prophylaxis (PrEP). You are considered at risk if:  You are sexually active and do not regularly use condoms or know the HIV  status of your partner(s).  You take drugs by injection.  You are sexually active with a partner who has HIV. Talk with your health care provider about whether you are at high risk of being infected with HIV. If you choose to begin PrEP, you should first be tested for HIV. You should then be tested every 3 months for as long as you are taking PrEP.  PREGNANCY   If you are premenopausal and you may become pregnant, ask your health care provider about preconception counseling.  If you may become pregnant, take 400 to 800 micrograms (mcg) of folic acid every day.  If you want to prevent pregnancy, talk to your health care provider about birth control (contraception). OSTEOPOROSIS AND MENOPAUSE   Osteoporosis is a disease in which the bones lose minerals and strength with aging. This can result in serious bone fractures. Your risk for osteoporosis can be identified using a bone density scan.  If you are 73 years of age or older, or if you are at risk for osteoporosis and fractures, ask your health care provider if you should be screened.  Ask your health care provider whether you should take a calcium or vitamin D supplement to lower your risk for osteoporosis.  Menopause may have certain physical symptoms and risks.  Hormone replacement therapy may reduce some of these symptoms and risks. Talk to your health care provider about whether hormone replacement therapy is right for you.  HOME CARE INSTRUCTIONS   Schedule regular  health, dental, and eye exams.  Stay current with your immunizations.   Do not use any tobacco products including cigarettes, chewing tobacco, or electronic cigarettes.  If you are pregnant, do not drink alcohol.  If you are breastfeeding, limit how much and how often you drink alcohol.  Limit alcohol intake to no more than 1 drink per day for nonpregnant women. One drink equals 12 ounces of beer, 5 ounces of wine, or 1 ounces of hard liquor.  Do not use street drugs.  Do not share needles.  Ask your health care provider for help if you need support or information about quitting drugs.  Tell your health care provider if you often feel depressed.  Tell your health care provider if you have ever been abused or do not feel safe at home.   This information is not intended to replace advice given to you by your health care provider. Make sure you discuss any questions you have with your health care provider.   Document Released: 09/21/2010 Document Revised: 03/29/2014 Document Reviewed: 02/07/2013 Elsevier Interactive Patient Education [employer].

## 2015-03-06 NOTE — Progress Notes (Signed)
Pre visit review using our clinic review tool, if applicable. No additional management support is needed unless otherwise documented below in the visit note. 

## 2015-03-06 NOTE — Assessment & Plan Note (Signed)
dexa up to date Continue evista No taking calcium or vitamin D - check vitamin d level Continue regular exercise

## 2015-03-06 NOTE — Progress Notes (Signed)
Subjective:    Patient ID: Sherry Mitchell, female    DOB: 12-Sep-1945, 69 y.o.   MRN: [id number]  HPI She is here to establish with a new pcp.  She is here for a physcial exam.    She has no concerns.  She is taking all of her medications as prescribed.   Medications and allergies reviewed with patient and no updates were needed.  Patient Active Problem List   Diagnosis Date Noted  . Fever of unknown origin (FUO) 12/06/2014  . Osteopenia   . Osteoarthritis   . Hypertension   . Colon polyps     Current Outpatient Prescriptions on File Prior to Visit  Medication Sig Dispense Refill  . diltiazem (DILT-XR) 240 MG 24 hr capsule Take 1 capsule (240 mg total) by mouth daily. 90 capsule 3  . lisinopril-hydrochlorothiazide (PRINZIDE,ZESTORETIC) 20-25 MG per tablet Take 1 tablet by mouth daily. 90 tablet 3  . raloxifene (EVISTA) 60 MG tablet Take 1 tablet by mouth daily 90 tablet 3   No current facility-administered medications on file prior to visit.    Past Medical History  Diagnosis Date  . Hypertension   . Colon polyps 02/2003, 02/2009    Adenomatous polyps  . Osteopenia     DEXA 02/2011, 02/2013: -1.9  . Osteoarthritis     hands    Past Surgical History  Procedure Laterality Date  . Breast enhancement surgery  1987  . Colonoscopy      Social History   Social History  . Marital Status: Married    Spouse Name: N/A  . Number of Children: N/A  . Years of Education: N/A   Social History Main Topics  . Smoking status: Never Smoker   . Smokeless tobacco: Never Used  . Alcohol Use: Yes     Comment: wine with dinner  . Drug Use: No  . Sexual Activity: Not Asked   Other Topics Concern  . None   Social History Narrative    Review of Systems  Constitutional: Negative for fever, chills, appetite change, fatigue and unexpected weight change.  HENT: Negative for hearing loss.   Eyes: Negative for visual disturbance.  Respiratory: Negative for cough, shortness  of breath and wheezing.   Cardiovascular: Negative for chest pain, palpitations and leg swelling.  Gastrointestinal: Negative for nausea, abdominal pain, diarrhea, constipation and blood in stool.       No GERD  Genitourinary: Negative for dysuria and hematuria.  Musculoskeletal: Positive for arthralgias (arthritis hands, neck, knees -- mild). Negative for myalgias, back pain and gait problem.  Neurological: Negative for dizziness, weakness, light-headedness, numbness and headaches.  Psychiatric/Behavioral: Negative for sleep disturbance and dysphoric mood. The patient is not nervous/anxious.        Objective:   Filed Vitals:   03/06/15 0907  BP: 134/84  Pulse: 62  Temp: 97.7 F (36.5 C)  Resp: 16   Filed Weights   03/06/15 0907  Weight: 147 lb (66.679 kg)   Body mass index is 23.02 kg/(m^2).   Physical Exam  Constitutional: She is oriented to person, place, and time. She appears well-developed and well-nourished.  HENT:  Head: Normocephalic and atraumatic.  Right Ear: External ear normal.  Left Ear: External ear normal.  Mouth/Throat: Oropharynx is clear and moist.  Normal ear canals and TM  Eyes: Conjunctivae are normal.  Neck: Neck supple. No tracheal deviation present. No thyromegaly present.  Cardiovascular: Normal rate, regular rhythm and normal heart sounds.   No murmur  heard. Pulmonary/Chest: Effort normal and breath sounds normal. No respiratory distress. She has no wheezes. She has no rales.  Abdominal: Soft. She exhibits no distension. There is no tenderness.  Musculoskeletal: She exhibits no edema.  Lymphadenopathy:    She has no cervical adenopathy.  Neurological: She is alert and oriented to person, place, and time.  Skin: Skin is warm and dry.  Psychiatric: She has a normal mood and affect. Her behavior is normal.          Assessment & Plan:    Physical exam: Screening blood work ordered Immunizations - pneumovax today, others up to  date Colonoscopy up to date Mammogram up to date Dexa up to date Eye exams up to date EKG up to date Exercise - stressed regular exercise Weight good, normal BMI Skin no concerns, discussed skin cancer awareness Substance abuse - no concerns, no abuse  See Problem List for assessment and plan of chronic medical problems  Follow-up annually   See problem list for chronic medical problem A/P

## 2015-03-06 NOTE — Assessment & Plan Note (Signed)
BP Readings from Last 3 Encounters:  03/06/15 134/84  01/20/15 128/80  12/06/14 114/70   Well controlled  Continue current meds at current doses Follow up annually

## 2015-03-19 ENCOUNTER — Other Ambulatory Visit (INDEPENDENT_AMBULATORY_CARE_PROVIDER_SITE_OTHER): Payer: Medicare HMO

## 2015-03-19 DIAGNOSIS — I10 Essential (primary) hypertension: Secondary | ICD-10-CM

## 2015-03-19 DIAGNOSIS — Z0000 Encounter for general adult medical examination without abnormal findings: Secondary | ICD-10-CM

## 2015-03-19 LAB — CBC WITH DIFFERENTIAL/PLATELET
Basophils Absolute: 0.1 10*3/uL (ref 0.0–0.1)
Basophils Relative: 1 % (ref 0.0–3.0)
EOS PCT: 1.6 % (ref 0.0–5.0)
Eosinophils Absolute: 0.1 10*3/uL (ref 0.0–0.7)
HEMATOCRIT: 42.1 % (ref 36.0–46.0)
HEMOGLOBIN: 14 g/dL (ref 12.0–15.0)
Lymphocytes Relative: 53.7 % — ABNORMAL HIGH (ref 12.0–46.0)
Lymphs Abs: 3.7 10*3/uL (ref 0.7–4.0)
MCHC: 33.2 g/dL (ref 30.0–36.0)
MCV: 89.8 fl (ref 78.0–100.0)
MONOS PCT: 11.2 % (ref 3.0–12.0)
Monocytes Absolute: 0.8 10*3/uL (ref 0.1–1.0)
Neutro Abs: 2.3 10*3/uL (ref 1.4–7.7)
Neutrophils Relative %: 32.5 % — ABNORMAL LOW (ref 43.0–77.0)
Platelets: 263 10*3/uL (ref 150.0–400.0)
RBC: 4.69 Mil/uL (ref 3.87–5.11)
RDW: 16.4 % — ABNORMAL HIGH (ref 11.5–15.5)
WBC: 7 10*3/uL (ref 4.0–10.5)

## 2015-03-19 LAB — COMPREHENSIVE METABOLIC PANEL
ALBUMIN: 4 g/dL (ref 3.5–5.2)
ALK PHOS: 61 U/L (ref 39–117)
ALT: 11 U/L (ref 0–35)
AST: 15 U/L (ref 0–37)
BUN: 17 mg/dL (ref 6–23)
CO2: 27 mEq/L (ref 19–32)
Calcium: 9.6 mg/dL (ref 8.4–10.5)
Chloride: 103 mEq/L (ref 96–112)
Creatinine, Ser: 0.64 mg/dL (ref 0.40–1.20)
GFR: 97.61 mL/min (ref >60.00)
GLUCOSE: 112 mg/dL — AB (ref 70–99)
POTASSIUM: 4.8 meq/L (ref 3.5–5.1)
Sodium: 138 mEq/L (ref 135–145)
TOTAL PROTEIN: 7.2 g/dL (ref 6.0–8.3)
Total Bilirubin: 0.5 mg/dL (ref 0.2–1.2)

## 2015-03-19 LAB — LIPID PANEL
CHOL/HDL RATIO: 3
Cholesterol: 196 mg/dL (ref 0–200)
HDL: 78.2 mg/dL (ref >39.00)
LDL Cholesterol: 102 mg/dL — ABNORMAL HIGH (ref 0–99)
NONHDL: 117.92
Triglycerides: 81 mg/dL (ref 0.0–149.0)
VLDL: 16.2 mg/dL (ref 0.0–40.0)

## 2015-03-19 LAB — TSH: TSH: 0.96 u[IU]/mL (ref 0.35–4.50)

## 2015-03-23 ENCOUNTER — Encounter: Payer: Self-pay | Admitting: Internal Medicine

## 2015-03-23 LAB — VITAMIN D 1,25 DIHYDROXY
VITAMIN D 1, 25 (OH) TOTAL: 40 pg/mL (ref 18–72)
VITAMIN D3 1, 25 (OH): 40 pg/mL

## 2015-04-24 DIAGNOSIS — D2262 Melanocytic nevi of left upper limb, including shoulder: Secondary | ICD-10-CM | POA: Diagnosis not present

## 2015-04-24 DIAGNOSIS — L821 Other seborrheic keratosis: Secondary | ICD-10-CM | POA: Diagnosis not present

## 2015-04-24 DIAGNOSIS — L814 Other melanin hyperpigmentation: Secondary | ICD-10-CM | POA: Diagnosis not present

## 2015-04-24 DIAGNOSIS — L918 Other hypertrophic disorders of the skin: Secondary | ICD-10-CM | POA: Diagnosis not present

## 2015-04-24 DIAGNOSIS — D1801 Hemangioma of skin and subcutaneous tissue: Secondary | ICD-10-CM | POA: Diagnosis not present

## 2015-04-24 DIAGNOSIS — L570 Actinic keratosis: Secondary | ICD-10-CM | POA: Diagnosis not present

## 2015-08-05 DIAGNOSIS — N898 Other specified noninflammatory disorders of vagina: Secondary | ICD-10-CM | POA: Diagnosis not present

## 2015-08-05 DIAGNOSIS — R350 Frequency of micturition: Secondary | ICD-10-CM | POA: Diagnosis not present

## 2015-10-06 ENCOUNTER — Ambulatory Visit (INDEPENDENT_AMBULATORY_CARE_PROVIDER_SITE_OTHER): Payer: PPO | Admitting: Internal Medicine

## 2015-10-06 ENCOUNTER — Encounter: Payer: Self-pay | Admitting: Internal Medicine

## 2015-10-06 VITALS — BP 118/62 | HR 70 | Temp 98.1°F | Resp 20 | Wt 151.0 lb

## 2015-10-06 DIAGNOSIS — R21 Rash and other nonspecific skin eruption: Secondary | ICD-10-CM | POA: Diagnosis not present

## 2015-10-06 MED ORDER — LISINOPRIL-HYDROCHLOROTHIAZIDE 20-25 MG PO TABS: 1.0000 | ORAL_TABLET | Freq: Every day | ORAL | Status: DC

## 2015-10-06 MED ORDER — DILTIAZEM HCL ER 240 MG PO CP24: 240.0000 mg | ORAL_CAPSULE | Freq: Every day | ORAL | Status: DC

## 2015-10-06 MED ORDER — RALOXIFENE HCL 60 MG PO TABS: ORAL_TABLET | ORAL | Status: DC

## 2015-10-06 NOTE — Assessment & Plan Note (Signed)
Presumed bug bite from 2 weeks ago No itching or pain Continue to apply Neosporin and monitor carefully Does not appear to be a tick bite May have the current appearance because of increased aggravation from friction because of its location No treatment needed-she will call for changes and we can adjust treatment Discussed monitoring for any symptoms suggestive of a tick born illness-she will call immediately

## 2015-10-06 NOTE — Progress Notes (Signed)
Pre visit review using our clinic review tool, if applicable. No additional management support is needed unless otherwise documented below in the visit note. 

## 2015-10-06 NOTE — Progress Notes (Signed)
Subjective:    Patient ID: Sherry Mitchell, female    DOB: 1946-02-16, 70 y.o.   MRN: [id number]  HPI She is here for an acute visit.   2 weeks ago she was working outside and she thinks she was bit by a bug. She had a slightly red, hard area in her right upper leg that had some mild irritation around it. That went away and now looks like a flat blister. She denies any pain or itching at any time. She has been putting Neosporin on it. She states because of where it's located she does get friction the area. She denies any other concerning symptoms including fever, joint pain, muscle aches, other rashes, headaches and fatigue.  She was concerned that this has not resolved yet and it has been 2 weeks and if she needs anything further.    Medications and allergies reviewed with patient and updated if appropriate.  Patient Active Problem List   Diagnosis Date Noted  . Fever of unknown origin (FUO) 12/06/2014  . Osteopenia   . Osteoarthritis   . Hypertension   . Colon polyps     Current Outpatient Prescriptions on File Prior to Visit  Medication Sig Dispense Refill  . diltiazem (DILT-XR) 240 MG 24 hr capsule Take 1 capsule (240 mg total) by mouth daily. 90 capsule 3  . lisinopril-hydrochlorothiazide (PRINZIDE,ZESTORETIC) 20-25 MG per tablet Take 1 tablet by mouth daily. 90 tablet 3  . raloxifene (EVISTA) 60 MG tablet Take 1 tablet by mouth daily 90 tablet 3   No current facility-administered medications on file prior to visit.    Past Medical History  Diagnosis Date  . Hypertension   . Colon polyps 02/2003, 02/2009    Adenomatous polyps  . Osteopenia     DEXA 02/2011, 02/2013: -1.9  . Osteoarthritis     hands    Past Surgical History  Procedure Laterality Date  . Breast enhancement surgery  1987  . Colonoscopy      Social History   Social History  . Marital Status: Married    Spouse Name: N/A  . Number of Children: N/A  . Years of Education: N/A   Social  History Main Topics  . Smoking status: Never Smoker   . Smokeless tobacco: Never Used  . Alcohol Use: Yes     Comment: wine with dinner  . Drug Use: No  . Sexual Activity: Not Asked   Other Topics Concern  . None   Social History Narrative   Exercises regularly    Family History  Problem Relation Age of Onset  . Cancer Mother     cancer of saliva glands  . Heart disease Other   . Colon cancer Neg Hx   . Esophageal cancer Neg Hx   . Stomach cancer Neg Hx   . Rectal cancer Neg Hx     Review of Systems  Constitutional: Negative for fever and fatigue.  Musculoskeletal: Negative for myalgias and arthralgias.  Skin: Negative for rash.  Neurological: Negative for dizziness, light-headedness and headaches.       Objective:   Filed Vitals:   10/06/15 1129  BP: 118/62  Pulse: 70  Temp: 98.1 F (36.7 C)  Resp: 20   Filed Weights   10/06/15 1129  Weight: 151 lb (68.493 kg)   Body mass index is 23.64 kg/(m^2).   Physical Exam  Constitutional: She appears well-developed and well-nourished. No distress.  Musculoskeletal: She exhibits no edema.  Skin: Skin is warm.  She is not diaphoretic. There is erythema (Oblong nickle size erythema that is indurated, no fluctuance, no open wound, nontender, slightly raised).          Assessment & Plan:   See Problem List for Assessment and Plan of chronic medical problems.

## 2015-10-06 NOTE — Patient Instructions (Signed)
Monitor your bug bite area closely and if there are any changes call and we will change treatment.  For now continue neosporin.

## 2015-10-15 DIAGNOSIS — L720 Epidermal cyst: Secondary | ICD-10-CM | POA: Diagnosis not present

## 2015-12-04 DIAGNOSIS — C44622 Squamous cell carcinoma of skin of right upper limb, including shoulder: Secondary | ICD-10-CM | POA: Diagnosis not present

## 2015-12-04 DIAGNOSIS — D485 Neoplasm of uncertain behavior of skin: Secondary | ICD-10-CM | POA: Diagnosis not present

## 2015-12-12 DIAGNOSIS — H5213 Myopia, bilateral: Secondary | ICD-10-CM | POA: Diagnosis not present

## 2015-12-18 DIAGNOSIS — C44622 Squamous cell carcinoma of skin of right upper limb, including shoulder: Secondary | ICD-10-CM | POA: Diagnosis not present

## 2016-02-19 DIAGNOSIS — Z1231 Encounter for screening mammogram for malignant neoplasm of breast: Secondary | ICD-10-CM | POA: Diagnosis not present

## 2016-02-19 DIAGNOSIS — Z01419 Encounter for gynecological examination (general) (routine) without abnormal findings: Secondary | ICD-10-CM | POA: Diagnosis not present

## 2016-04-04 ENCOUNTER — Encounter: Payer: Self-pay | Admitting: Internal Medicine

## 2016-04-04 NOTE — Progress Notes (Signed)
Subjective:    Patient ID: Sherry Mitchell, female    DOB: 07-04-1945, 71 y.o.   MRN: [id number]  HPI She is here for a physical exam.   She denies any changes in her history since she was here last.  Overall, she feels very good.   Hypertension: She is taking her medication daily. She is compliant with a low sodium diet.  She denies chest pain, palpitations, edema, shortness of breath and regular headaches. She is not exercising regularly.      Osteopenia:  She is taking evista - she is unsure for how long.  She has been on it for at least 5 years.  She is not currently exercising.  She is not taking any calcium and vitamin d.  Her last dexa was 2014.   Has not been exercising - but will restart.  Typically walks or does a stationary bike.   Medications and allergies reviewed with patient and updated if appropriate.  Patient Active Problem List   Diagnosis Date Noted  . Osteopenia   . Osteoarthritis   . Hypertension   . Colon polyps     Current Outpatient Prescriptions on File Prior to Visit  Medication Sig Dispense Refill  . diltiazem (DILT-XR) 240 MG 24 hr capsule Take 1 capsule (240 mg total) by mouth daily. 90 capsule 3  . lisinopril-hydrochlorothiazide (PRINZIDE,ZESTORETIC) 20-25 MG tablet Take 1 tablet by mouth daily. 90 tablet 3  . raloxifene (EVISTA) 60 MG tablet Take 1 tablet by mouth daily 90 tablet 3   No current facility-administered medications on file prior to visit.     Past Medical History:  Diagnosis Date  . Colon polyps 02/2003, 02/2009   Adenomatous polyps  . Hypertension   . Osteoarthritis    hands  . Osteopenia    DEXA 02/2011, 02/2013: -1.9    Past Surgical History:  Procedure Laterality Date  . BREAST ENHANCEMENT SURGERY  1987  . COLONOSCOPY      Social History   Social History  . Marital status: Married    Spouse name: N/A  . Number of children: N/A  . Years of education: N/A   Social History Main Topics  . Smoking status:  Never Smoker  . Smokeless tobacco: Never Used  . Alcohol use Yes     Comment: wine with dinner  . Drug use: No  . Sexual activity: Not on file   Other Topics Concern  . Not on file   Social History Narrative   Exercises regularly    Family History  Problem Relation Age of Onset  . Cancer Mother     cancer of saliva glands  . Heart disease Other   . Colon cancer Neg Hx   . Esophageal cancer Neg Hx   . Stomach cancer Neg Hx   . Rectal cancer Neg Hx     Review of Systems  Constitutional: Negative for appetite change, chills, fatigue, fever and unexpected weight change.  HENT: Negative for hearing loss.   Eyes: Negative for visual disturbance.  Respiratory: Negative for cough, shortness of breath and wheezing.   Cardiovascular: Negative for chest pain, palpitations and leg swelling.  Gastrointestinal: Negative for abdominal pain, blood in stool, constipation, diarrhea and nausea.       No gerd  Genitourinary: Negative for dysuria and hematuria.  Musculoskeletal: Positive for arthralgias (neck, hands) and neck pain (arthritis).  Neurological: Negative for light-headedness and headaches.  Psychiatric/Behavioral: Negative for dysphoric mood. The patient is not nervous/anxious.  Objective:   Vitals:   04/05/16 1033  BP: 132/84  Pulse: 81  Resp: 16  Temp: 98 F (36.7 C)   Filed Weights   04/05/16 1033  Weight: 151 lb (68.5 kg)   Body mass index is 23.65 kg/m.  Wt Readings from Last 3 Encounters:  04/05/16 151 lb (68.5 kg)  10/06/15 151 lb (68.5 kg)  03/06/15 147 lb (66.7 kg)     Physical Exam Constitutional: She appears well-developed and well-nourished. No distress.  HENT:  Head: Normocephalic and atraumatic.  Right Ear: External ear normal. Normal ear canal and TM Left Ear: External ear normal.  Normal ear canal and TM Mouth/Throat: Oropharynx is clear and moist.  Eyes: Conjunctivae and EOM are normal.  Neck: Neck supple. No tracheal deviation  present. No thyromegaly present.  No carotid bruit  Cardiovascular: Normal rate, regular rhythm and normal heart sounds.   No murmur heard.  No edema. Pulmonary/Chest: Effort normal and breath sounds normal. No respiratory distress. She has no wheezes. She has no rales.  Breast: deferred to Gyn Abdominal: Soft. She exhibits no distension. There is no tenderness.  Lymphadenopathy: She has no cervical adenopathy.  Skin: Skin is warm and dry. She is not diaphoretic.  Psychiatric: She has a normal mood and affect. Her behavior is normal.         Assessment & Plan:   Physical exam: Screening blood work ordered Immunizations   Up to date  Colonoscopy  Up to date  Mammogram  Up to date  - done at gyn's office in 02/2016 - Dr Benjie Karvonen Gyn  Up to date  Dexa - due - will order Eye exams  Up to date  Exercise - has not been exercising due to other priorities, but will restart Weight - normal BMI Skin  - sees derm annually Substance abuse  - none  See Problem List for Assessment and Plan of chronic medical problems.   FU annually, sooner if needed

## 2016-04-04 NOTE — Patient Instructions (Addendum)
Test(s) ordered today. Your results will be released to [place] (or called to you) after review, usually within 72hours after test completion. If any changes need to be made, you will be notified at that same time.  All other Health Maintenance issues reviewed.   All recommended immunizations and age-appropriate screenings are up-to-date or discussed.  No immunizations administered today.   Medications reviewed and updated.  Changes include stopping evista and starting calcium and vitamin D daily.   Your prescription(s) have been submitted to your pharmacy. Please take as directed and contact our office if you believe you are having problem(s) with the medication(s).  A dexa was ordered.   Please followup in one year, sooner if needed   Health Maintenance, Female Introduction Adopting a healthy lifestyle and getting preventive care can go a long way to promote health and wellness. Talk with your health care provider about what schedule of regular examinations is right for you. This is a good chance for you to check in with your provider about disease prevention and staying healthy. In between checkups, there are plenty of things you can do on your own. Experts have done a lot of research about which lifestyle changes and preventive measures are most likely to keep you healthy. Ask your health care provider for more information. Weight and diet Eat a healthy diet  Be sure to include plenty of vegetables, fruits, low-fat dairy products, and lean protein.  Do not eat a lot of foods high in solid fats, added sugars, or salt.  Get regular exercise. This is one of the most important things you can do for your health.  Most adults should exercise for at least 150 minutes each week. The exercise should increase your heart rate and make you sweat (moderate-intensity exercise).  Most adults should also do strengthening exercises at least twice a week. This is in addition to the  moderate-intensity exercise. Maintain a healthy weight  Body mass index (BMI) is a measurement that can be used to identify possible weight problems. It estimates body fat based on height and weight. Your health care provider can help determine your BMI and help you achieve or maintain a healthy weight.  For females 71 years of age and older:  A BMI below 18.5 is considered underweight.  A BMI of 18.5 to 24.9 is normal.  A BMI of 25 to 29.9 is considered overweight.  A BMI of 30 and above is considered obese. Watch levels of cholesterol and blood lipids  You should start having your blood tested for lipids and cholesterol at 71 years of age, then have this test every 5 years.  You may need to have your cholesterol levels checked more often if:  Your lipid or cholesterol levels are high.  You are older than 71 years of age.  You are at high risk for heart disease. Cancer screening Lung Cancer  Lung cancer screening is recommended for adults 71-32 years old who are at high risk for lung cancer because of a history of smoking.  A yearly low-dose CT scan of the lungs is recommended for people who:  Currently smoke.  Have quit within the past 15 years.  Have at least a 30-pack-year history of smoking. A pack year is smoking an average of one pack of cigarettes a day for 1 year.  Yearly screening should continue until it has been 15 years since you quit.  Yearly screening should stop if you develop a health problem that would prevent you  from having lung cancer treatment. Breast Cancer  Practice breast self-awareness. This means understanding how your breasts normally appear and feel.  It also means doing regular breast self-exams. Let your health care provider know about any changes, no matter how small.  If you are in your 20s or 30s, you should have a clinical breast exam (CBE) by a health care provider every 1-3 years as part of a regular health exam.  If you are 71 or  older, have a CBE every year every year. Also consider having a breast X-ray (mammogram) every year.  If you have a family history of breast cancer, talk to your health care provider about genetic screening.  If you are at high risk for breast cancer, talk to your health care provider about having an MRI and a mammogram every year.  Breast cancer gene (BRCA) assessment is recommended for women who have family members with BRCA-related cancers. BRCA-related cancers include:  Breast.  Ovarian.  Tubal.  Peritoneal cancers.  Results of the assessment will determine the need for genetic counseling and BRCA1 and BRCA2 testing. Cervical Cancer  Your health care provider may recommend that you be screened regularly for cancer of the pelvic organs (ovaries, uterus, and vagina). This screening involves a pelvic examination, including checking for microscopic changes to the surface of your cervix (Pap test). You may be encouraged to have this screening done every 3 years, beginning at age 71.  For women ages 27-65, health care providers may recommend pelvic exams and Pap testing every 3 years, or they may recommend the Pap and pelvic exam, combined with testing for human papilloma virus (HPV), every 5 years. Some types of HPV increase your risk of cervical cancer. Testing for HPV may also be done on women of any age with unclear Pap test results.  Other health care providers may not recommend any screening for nonpregnant women who are considered low risk for pelvic cancer and who do not have symptoms. Ask your health care provider if a screening pelvic exam is right for you.  If you have had past treatment for cervical cancer or a condition that could lead to cancer, you need Pap tests and screening for cancer for at least 20 years after your treatment. If Pap tests have been discontinued, your risk factors (such as having a new sexual partner) need to be reassessed to determine if screening should resume. Some  women have medical problems that increase the chance of getting cervical cancer. In these cases, your health care provider may recommend more frequent screening and Pap tests. Colorectal Cancer  This type of cancer can be detected and often prevented.  Routine colorectal cancer screening usually begins at 71 years of age and continues through 71 years of age.  Your health care provider may recommend screening at an earlier age if you have risk factors for colon cancer.  Your health care provider may also recommend using home test kits to check for hidden blood in the stool.  A small camera at the end of a tube can be used to examine your colon directly (sigmoidoscopy or colonoscopy). This is done to check for the earliest forms of colorectal cancer.  Routine screening usually begins at age 83.  Direct examination of the colon should be repeated every 5-10 years through 71 years of age. However, you may need to be screened more often if early forms of precancerous polyps or small growths are found. Skin Cancer  Check your skin from head to toe  regularly.  Tell your health care provider about any new moles or changes in moles, especially if there is a change in a mole's shape or color.  Also tell your health care provider if you have a mole that is larger than the size of a pencil eraser.  Always use sunscreen. Apply sunscreen liberally and repeatedly throughout the day.  Protect yourself by wearing long sleeves, pants, a wide-brimmed hat, and sunglasses whenever you are outside. Heart disease, diabetes, and high blood pressure  High blood pressure causes heart disease and increases the risk of stroke. High blood pressure is more likely to develop in:  People who have blood pressure in the high end of the normal range (130-139/85-89 mm Hg).  People who are overweight or obese.  People who are African American.  If you are 68-59 years of age, have your blood pressure checked every  3-5 years. If you are 66 years of age or older, have your blood pressure checked every year. You should have your blood pressure measured twice-once when you are at a hospital or clinic, and once when you are not at a hospital or clinic. Record the average of the two measurements. To check your blood pressure when you are not at a hospital or clinic, you can use:  An automated blood pressure machine at a pharmacy.  A home blood pressure monitor.  If you are between 83 years and 31 years old, ask your health care provider if you should take aspirin to prevent strokes.  Have regular diabetes screenings. This involves taking a blood sample to check your fasting blood sugar level.  If you are at a normal weight and have a low risk for diabetes, have this test once every three years after 71 years of age.  If you are overweight and have a high risk for diabetes, consider being tested at a younger age or more often. Preventing infection Hepatitis B  If you have a higher risk for hepatitis B, you should be screened for this virus. You are considered at high risk for hepatitis B if:  You were born in a country where hepatitis B is common. Ask your health care provider which countries are considered high risk.  Your parents were born in a high-risk country, and you have not been immunized against hepatitis B (hepatitis B vaccine).  You have HIV or AIDS.  You use needles to inject street drugs.  You live with someone who has hepatitis B.  You have had sex with someone who has hepatitis B.  You get hemodialysis treatment.  You take certain medicines for conditions, including cancer, organ transplantation, and autoimmune conditions. Hepatitis C  Blood testing is recommended for:  Everyone born from [birth year] through 1965.  Anyone with known risk factors for hepatitis C. Sexually transmitted infections (STIs)  You should be screened for sexually transmitted infections (STIs) including  gonorrhea and chlamydia if:  You are sexually active and are younger than 71 years of age.  You are older than 71 years of age and your health care provider tells you that you are at risk for this type of infection.  Your sexual activity has changed since you were last screened and you are at an increased risk for chlamydia or gonorrhea. Ask your health care provider if you are at risk.  If you do not have HIV, but are at risk, it may be recommended that you take a prescription medicine daily to prevent HIV infection. This is called pre-exposure  prophylaxis (PrEP). You are considered at risk if:  You are sexually active and do not regularly use condoms or know the HIV status of your partner(s).  You take drugs by injection.  You are sexually active with a partner who has HIV. Talk with your health care provider about whether you are at high risk of being infected with HIV. If you choose to begin PrEP, you should first be tested for HIV. You should then be tested every 3 months for as long as you are taking PrEP. Pregnancy  If you are premenopausal and you may become pregnant, ask your health care provider about preconception counseling.  If you may become pregnant, take 400 to 800 micrograms (mcg) of folic acid every day.  If you want to prevent pregnancy, talk to your health care provider about birth control (contraception). Osteoporosis and menopause  Osteoporosis is a disease in which the bones lose minerals and strength with aging. This can result in serious bone fractures. Your risk for osteoporosis can be identified using a bone density scan.  If you are 23 years of age or older, or if you are at risk for osteoporosis and fractures, ask your health care provider if you should be screened.  Ask your health care provider whether you should take a calcium or vitamin D supplement to lower your risk for osteoporosis.  Menopause may have certain physical symptoms and risks.  Hormone  replacement therapy may reduce some of these symptoms and risks. Talk to your health care provider about whether hormone replacement therapy is right for you. Follow these instructions at home:  Schedule regular health, dental, and eye exams.  Stay current with your immunizations.  Do not use any tobacco products including cigarettes, chewing tobacco, or electronic cigarettes.  If you are pregnant, do not drink alcohol.  If you are breastfeeding, limit how much and how often you drink alcohol.  Limit alcohol intake to no more than 1 drink per day for nonpregnant women. One drink equals 12 ounces of beer, 5 ounces of wine, or 1 ounces of hard liquor.  Do not use street drugs.  Do not share needles.  Ask your health care provider for help if you need support or information about quitting drugs.  Tell your health care provider if you often feel depressed.  Tell your health care provider if you have ever been abused or do not feel safe at home. This information is not intended to replace advice given to you by your health care provider. Make sure you discuss any questions you have with your health care provider. Document Released: 09/21/2010 Document Revised: 08/14/2015 Document Reviewed: 12/10/2014  2017 [employer]vier

## 2016-04-05 ENCOUNTER — Ambulatory Visit (INDEPENDENT_AMBULATORY_CARE_PROVIDER_SITE_OTHER): Payer: PPO | Admitting: Internal Medicine

## 2016-04-05 ENCOUNTER — Encounter: Payer: Self-pay | Admitting: Internal Medicine

## 2016-04-05 ENCOUNTER — Other Ambulatory Visit (INDEPENDENT_AMBULATORY_CARE_PROVIDER_SITE_OTHER): Payer: PPO

## 2016-04-05 VITALS — BP 132/84 | HR 81 | Temp 98.0°F | Resp 16 | Ht 67.0 in | Wt 151.0 lb

## 2016-04-05 DIAGNOSIS — Z13820 Encounter for screening for osteoporosis: Secondary | ICD-10-CM

## 2016-04-05 DIAGNOSIS — M1991 Primary osteoarthritis, unspecified site: Secondary | ICD-10-CM

## 2016-04-05 DIAGNOSIS — M85861 Other specified disorders of bone density and structure, right lower leg: Secondary | ICD-10-CM

## 2016-04-05 DIAGNOSIS — Z1159 Encounter for screening for other viral diseases: Secondary | ICD-10-CM

## 2016-04-05 DIAGNOSIS — Z0000 Encounter for general adult medical examination without abnormal findings: Secondary | ICD-10-CM

## 2016-04-05 DIAGNOSIS — I10 Essential (primary) hypertension: Secondary | ICD-10-CM

## 2016-04-05 LAB — CBC WITH DIFFERENTIAL/PLATELET
Basophils Absolute: 0.1 10*3/uL (ref 0.0–0.1)
Basophils Relative: 0.8 % (ref 0.0–3.0)
Eosinophils Absolute: 0.1 10*3/uL (ref 0.0–0.7)
Eosinophils Relative: 1.8 % (ref 0.0–5.0)
HCT: 42.6 % (ref 36.0–46.0)
Hemoglobin: 14.5 g/dL (ref 12.0–15.0)
LYMPHS ABS: 3.6 10*3/uL (ref 0.7–4.0)
Lymphocytes Relative: 48.2 % — ABNORMAL HIGH (ref 12.0–46.0)
MCHC: 34 g/dL (ref 30.0–36.0)
MCV: 90.2 fl (ref 78.0–100.0)
MONOS PCT: 9.1 % (ref 3.0–12.0)
Monocytes Absolute: 0.7 10*3/uL (ref 0.1–1.0)
NEUTROS ABS: 3 10*3/uL (ref 1.4–7.7)
NEUTROS PCT: 40.1 % — AB (ref 43.0–77.0)
PLATELETS: 245 10*3/uL (ref 150.0–400.0)
RBC: 4.73 Mil/uL (ref 3.87–5.11)
RDW: 13.8 % (ref 11.5–15.5)
WBC: 7.8 10*3/uL (ref 4.0–10.5)

## 2016-04-05 LAB — COMPREHENSIVE METABOLIC PANEL
ALT: 11 U/L (ref 0–35)
AST: 14 U/L (ref 0–37)
Albumin: 4.4 g/dL (ref 3.5–5.2)
Alkaline Phosphatase: 59 U/L (ref 39–117)
BILIRUBIN TOTAL: 0.6 mg/dL (ref 0.2–1.2)
BUN: 17 mg/dL (ref 6–23)
CO2: 29 meq/L (ref 19–32)
Calcium: 9.7 mg/dL (ref 8.4–10.5)
Chloride: 102 mEq/L (ref 96–112)
Creatinine, Ser: 0.63 mg/dL (ref 0.40–1.20)
GFR: 99.1 mL/min (ref >60.00)
GLUCOSE: 119 mg/dL — AB (ref 70–99)
POTASSIUM: 4.4 meq/L (ref 3.5–5.1)
Sodium: 138 mEq/L (ref 135–145)
Total Protein: 7.5 g/dL (ref 6.0–8.3)

## 2016-04-05 LAB — LIPID PANEL
CHOL/HDL RATIO: 3
Cholesterol: 216 mg/dL — ABNORMAL HIGH (ref 0–200)
HDL: 82.6 mg/dL (ref >39.00)
LDL CALC: 120 mg/dL — AB (ref 0–99)
NONHDL: 132.99
Triglycerides: 65 mg/dL (ref 0.0–149.0)
VLDL: 13 mg/dL (ref 0.0–40.0)

## 2016-04-05 LAB — TSH: TSH: 0.84 u[IU]/mL (ref 0.35–4.50)

## 2016-04-05 LAB — VITAMIN D 25 HYDROXY (VIT D DEFICIENCY, FRACTURES): VITD: 28.9 ng/mL — ABNORMAL LOW (ref 30.00–100.00)

## 2016-04-05 LAB — HEPATITIS C ANTIBODY: HCV Ab: NEGATIVE

## 2016-04-05 MED ORDER — DILTIAZEM HCL ER 240 MG PO CP24: 240.0000 mg | ORAL_CAPSULE | Freq: Every day | ORAL | 3 refills | Status: DC

## 2016-04-05 MED ORDER — LISINOPRIL-HYDROCHLOROTHIAZIDE 20-25 MG PO TABS: 1.0000 | ORAL_TABLET | Freq: Every day | ORAL | 3 refills | Status: DC

## 2016-04-05 NOTE — Progress Notes (Signed)
Pre visit review using our clinic review tool, if applicable. No additional management support is needed unless otherwise documented below in the visit note. 

## 2016-04-05 NOTE — Assessment & Plan Note (Signed)
BP well controlled Current regimen effective and well tolerated Continue current medications at current doses  

## 2016-04-05 NOTE — Assessment & Plan Note (Signed)
Mild Hands, neck Does not take anything for pain

## 2016-04-05 NOTE — Assessment & Plan Note (Signed)
dexa due - ordered Not taking calcium and vitamin d -- will start taking Will check vitamin d level Restart regular exercise - walks, bike Has been on evista for ? Years - probably at least 5 -- will stop after she finishes the last couple of pills she has at home - bone holiday and may not need medication

## 2016-04-14 ENCOUNTER — Ambulatory Visit (INDEPENDENT_AMBULATORY_CARE_PROVIDER_SITE_OTHER)
Admission: RE | Admit: 2016-04-14 | Discharge: 2016-04-14 | Disposition: A | Discharge status: Home / Self Care | Payer: PPO | Source: Ambulatory Visit | Attending: Internal Medicine | Admitting: Internal Medicine

## 2016-04-14 DIAGNOSIS — Z13820 Encounter for screening for osteoporosis: Secondary | ICD-10-CM | POA: Diagnosis not present

## 2016-04-14 DIAGNOSIS — M85861 Other specified disorders of bone density and structure, right lower leg: Secondary | ICD-10-CM

## 2016-04-24 ENCOUNTER — Encounter: Payer: Self-pay | Admitting: Internal Medicine

## 2016-04-24 DIAGNOSIS — M810 Age-related osteoporosis without current pathological fracture: Secondary | ICD-10-CM | POA: Insufficient documentation

## 2016-07-08 DIAGNOSIS — L821 Other seborrheic keratosis: Secondary | ICD-10-CM | POA: Diagnosis not present

## 2016-07-08 DIAGNOSIS — B360 Pityriasis versicolor: Secondary | ICD-10-CM | POA: Diagnosis not present

## 2016-07-08 DIAGNOSIS — L814 Other melanin hyperpigmentation: Secondary | ICD-10-CM | POA: Diagnosis not present

## 2016-07-08 DIAGNOSIS — Z85828 Personal history of other malignant neoplasm of skin: Secondary | ICD-10-CM | POA: Diagnosis not present

## 2016-09-30 NOTE — Progress Notes (Deleted)
Subjective:   Sherry Mitchell is a 71 y.o. female who presents for Medicare Annual (Subsequent) preventive examination.  Review of Systems:  No ROS.  Medicare Wellness Visit. Additional risk factors are reflected in the social history.    Sleep patterns: {SX; SLEEP PATTERNS:18802::"feels rested on waking","does not get up to void","gets up *** times nightly to void","sleeps *** hours nightly"}.   Home Safety/Smoke Alarms: Feels safe in home. Smoke alarms in place.  Living environment; residence and Firearm Safety: {Rehab home environment / accessibility:30080::"no firearms","firearms stored safely"}. Seat Belt Safety/Bike Helmet: Wears seat belt.   Counseling:   Eye Exam-  Dental-  Female:   Pap- N/A      Mammo- Last 08/08/13, BI-RADS CATEGORY  1:  Negative       Dexa scan- Last 04/14/16, osteoporosis T-score -2.8    CCS- Last 03/05/14, polyp, recall 5 years     Objective:     Vitals: There were no vitals taken for this visit.  There is no height or weight on file to calculate BMI.   Tobacco History  Smoking Status  . Never Smoker  Smokeless Tobacco  . Never Used     Counseling given: Not Answered   Past Medical History:  Diagnosis Date  . Colon polyps 02/2003, 02/2009   Adenomatous polyps  . Hypertension   . Osteoarthritis    hands  . Osteopenia    DEXA 02/2011, 02/2013: -1.9   Past Surgical History:  Procedure Laterality Date  . BREAST ENHANCEMENT SURGERY  1987  . COLONOSCOPY     Family History  Problem Relation Age of Onset  . Cancer Mother        cancer of saliva glands  . Heart disease Other   . Colon cancer Neg Hx   . Esophageal cancer Neg Hx   . Stomach cancer Neg Hx   . Rectal cancer Neg Hx    History  Sexual Activity  . Sexual activity: Not on file    Outpatient Encounter Prescriptions as of 10/01/2016  Medication Sig  . diltiazem (DILT-XR) 240 MG 24 hr capsule Take 1 capsule (240 mg total) by mouth daily.  [name]  lisinopril-hydrochlorothiazide (PRINZIDE,ZESTORETIC) 20-25 MG tablet Take 1 tablet by mouth daily.   No facility-administered encounter medications on file as of 10/01/2016.     Activities of Daily Living No flowsheet data found.  Patient Care Team: Binnie Rail, MD as PCP - General (Internal Medicine) Ladene Artist, MD (Gastroenterology) Azucena Fallen, MD (Obstetrics and Gynecology) Almedia Balls, MD (Orthopedic Surgery)    Assessment:    Physical assessment deferred to PCP.  Exercise Activities and Dietary recommendations   Diet (meal preparation, eat out, water intake, caffeinated beverages, dairy products, fruits and vegetables): {Desc; diets:16563} Breakfast: Lunch:  Dinner:      Goals    None     Fall Risk Fall Risk  04/05/2016 03/04/2014 03/01/2013 02/28/2012  Falls in the past year? No No No No   Depression Screen PHQ 2/9 Scores 04/05/2016 03/04/2014 03/01/2013 02/28/2012  PHQ - 2 Score 0 0 0 0     Cognitive Function        Immunization History  Administered Date(s) Administered  . Influenza Split 02/26/2011, 11/29/2011, 12/07/2013  . Influenza, High Dose Seasonal PF 11/28/2012  . Influenza-Unspecified 01/03/2015, 12/26/2015  . Pneumococcal Conjugate-13 03/04/2014  . Pneumococcal Polysaccharide-23 03/06/2015  . Tdap 02/26/2011   Screening Tests Health Maintenance  Topic Date Due  . MAMMOGRAM  08/09/2015  .  INFLUENZA VACCINE  10/20/2016  . DEXA SCAN  04/14/2018  . COLONOSCOPY  03/06/2019  . TETANUS/TDAP  02/25/2021  . Hepatitis C Screening  Completed  . PNA vac Low Risk Adult  Completed      Plan:     I have personally reviewed and noted the following in the patient's chart:   . Medical and social history . Use of alcohol, tobacco or illicit drugs  . Current medications and supplements . Functional ability and status . Nutritional status . Physical activity . Advanced directives . List of other physicians . Vitals . Screenings to  include cognitive, depression, and falls . Referrals and appointments  In addition, I have reviewed and discussed with patient certain preventive protocols, quality metrics, and best practice recommendations. A written personalized care plan for preventive services as well as general preventive health recommendations were provided to patient.     Michiel Cowboy, RN  09/30/2016

## 2016-09-30 NOTE — Progress Notes (Deleted)
Pre visit review using our clinic review tool, if applicable. No additional management support is needed unless otherwise documented below in the visit note. 

## 2016-10-01 ENCOUNTER — Ambulatory Visit: Payer: PPO

## 2016-11-19 ENCOUNTER — Ambulatory Visit (INDEPENDENT_AMBULATORY_CARE_PROVIDER_SITE_OTHER): Payer: PPO | Admitting: *Deleted

## 2016-11-19 ENCOUNTER — Telehealth: Payer: Self-pay | Admitting: Internal Medicine

## 2016-11-19 VITALS — BP 122/70 | HR 62 | Resp 20 | Ht 67.0 in | Wt 153.0 lb

## 2016-11-19 DIAGNOSIS — Z0000 Encounter for general adult medical examination without abnormal findings: Secondary | ICD-10-CM

## 2016-11-19 DIAGNOSIS — Z23 Encounter for immunization: Secondary | ICD-10-CM | POA: Diagnosis not present

## 2016-11-19 NOTE — Progress Notes (Addendum)
Subjective:   Sherry Mitchell is a 71 y.o. female who presents for Medicare Annual (Subsequent) preventive examination.  Review of Systems:  No ROS.  Medicare Wellness Visit. Additional risk factors are reflected in the social history.    Sleep patterns: has frequent nighttime awakenings, gets up 1 times nightly to void and sleeps 5-6 hours nightly. Patient reports insomnia issues, discussed recommended sleep tips and stress reduction tips, education was attached to patient's AVS.  Home Safety/Smoke Alarms: Feels safe in home. Smoke alarms in place.  Living environment; residence and Firearm Safety: 2-story house, no firearms. Lives with husband, no needs for DME, good support system Seat Belt Safety/Bike Helmet: Wears seat belt.  Female:   Pap- N/A      Mammo- Last recently per patient, she will contact Dr. Benjie Karvonen and request results to be sent to PCP      Dexa scan- Last 04/14/16, osteoporosis      CCS- Last 03/05/14, recall 5 years     Objective:     Vitals: There were no vitals taken for this visit.  There is no height or weight on file to calculate BMI.   Tobacco History  Smoking Status  . Never Smoker  Smokeless Tobacco  . Never Used     Counseling given: Not Answered   Past Medical History:  Diagnosis Date  . Colon polyps 02/2003, 02/2009   Adenomatous polyps  . Hypertension   . Osteoarthritis    hands  . Osteopenia    DEXA 02/2011, 02/2013: -1.9   Past Surgical History:  Procedure Laterality Date  . BREAST ENHANCEMENT SURGERY  1987  . COLONOSCOPY     Family History  Problem Relation Age of Onset  . Cancer Mother        cancer of saliva glands  . Heart disease Other   . Colon cancer Neg Hx   . Esophageal cancer Neg Hx   . Stomach cancer Neg Hx   . Rectal cancer Neg Hx    History  Sexual Activity  . Sexual activity: Not on file    Outpatient Encounter Prescriptions as of 11/19/2016  Medication Sig  . diltiazem (DILT-XR) 240 MG 24 hr capsule  Take 1 capsule (240 mg total) by mouth daily.  [name] lisinopril-hydrochlorothiazide (PRINZIDE,ZESTORETIC) 20-25 MG tablet Take 1 tablet by mouth daily.   No facility-administered encounter medications on file as of 11/19/2016.     Activities of Daily Living No flowsheet data found.  Patient Care Team: Binnie Rail, MD as PCP - General (Internal Medicine) Ladene Artist, MD (Gastroenterology) Azucena Fallen, MD (Obstetrics and Gynecology) Almedia Balls, MD (Orthopedic Surgery)    Assessment:    Physical assessment deferred to PCP.  Exercise Activities and Dietary recommendations   Diet (meal preparation, eat out, water intake, caffeinated beverages, dairy products, fruits and vegetables): in general, a "healthy" diet  , well balanced, eats a variety of fruits and vegetables daily, limits salt, fat/cholesterol, sugar, caffeine, drinks 6-8 glasses of water daily.  Goals    None     Fall Risk Fall Risk  04/05/2016 03/04/2014 03/01/2013 02/28/2012  Falls in the past year? No No No No   Depression Screen PHQ 2/9 Scores 04/05/2016 03/04/2014 03/01/2013 02/28/2012  PHQ - 2 Score 0 0 0 0     Cognitive Function       Ad8 score reviewed for issues:  Issues making decisions: no  Less interest in hobbies / activities: no  Repeats questions, stories (family  complaining): no  Trouble using ordinary gadgets (microwave, computer, phone):no  Forgets the month or year: no  Mismanaging finances: no  Remembering appts: no  Daily problems with thinking and/or memory: no Ad8 score is= 0  Immunization History  Administered Date(s) Administered  . Influenza Split 02/26/2011, 11/29/2011, 12/07/2013  . Influenza, High Dose Seasonal PF 11/28/2012  . Influenza-Unspecified 01/03/2015, 12/26/2015  . Pneumococcal Conjugate-13 03/04/2014  . Pneumococcal Polysaccharide-23 03/06/2015  . Tdap 02/26/2011   Screening Tests Health Maintenance  Topic Date Due  . MAMMOGRAM  08/09/2015  .  INFLUENZA VACCINE  10/20/2016  . DEXA SCAN  04/14/2018  . COLONOSCOPY  03/06/2019  . TETANUS/TDAP  02/25/2021  . Hepatitis C Screening  Completed  . PNA vac Low Risk Adult  Completed      Plan:     I have personally reviewed and noted the following in the patient's chart:   . Medical and social history . Use of alcohol, tobacco or illicit drugs  . Current medications and supplements . Functional ability and status . Nutritional status . Physical activity . Advanced directives . List of other physicians . Vitals . Screenings to include cognitive, depression, and falls . Referrals and appointments  In addition, I have reviewed and discussed with patient certain preventive protocols, quality metrics, and best practice recommendations. A written personalized care plan for preventive services as well as general preventive health recommendations were provided to patient.     Michiel Cowboy, RN  11/19/2016  Medical screening examination/treatment/procedure(s) were performed by non-physician practitioner and as supervising physician I was immediately available for consultation/collaboration. I agree with above. Cathlean Cower, MD

## 2016-11-19 NOTE — Telephone Encounter (Signed)
Pt would like labs done before the appt in dec

## 2016-11-19 NOTE — Patient Instructions (Addendum)
Continue doing brain stimulating activities (puzzles, reading, adult coloring books, staying active) to keep memory sharp.   Continue to eat heart healthy diet (full of fruits, vegetables, whole grains, lean protein, water--limit salt, fat, and sugar intake) and increase physical activity as tolerated.   Sherry Mitchell , Thank you for taking time to come for your Medicare Wellness Visit. I appreciate your ongoing commitment to your health goals. Please review the following plan we discussed and let me know if I can assist you in the future.   These are the goals we discussed: Goals    . Take time for myself to be as healthy as possible          Read books, exercise, enjoy life, and family. Continue to do a lot with my grandchildren.       This is a list of the screening recommended for you and due dates:  Health Maintenance  Topic Date Due  . Mammogram  08/09/2015  . Flu Shot  10/20/2016  . DEXA scan (bone density measurement)  04/14/2018  . Colon Cancer Screening  03/06/2019  . Tetanus Vaccine  02/25/2021  .  Hepatitis C: One time screening is recommended by Center for Disease Control  (CDC) for  adults born from [birth year] through 1965.   Completed  . Pneumonia vaccines  Completed   Influenza Virus Vaccine (Flucelvax) What is this medicine? INFLUENZA VIRUS VACCINE (in floo EN zuh VAHY ruhs vak SEEN) helps to reduce the risk of getting influenza also known as the flu. The vaccine only helps protect you against some strains of the flu. This medicine may be used for other purposes; ask your health care provider or pharmacist if you have questions. COMMON BRAND NAME(S): FLUCELVAX What should I tell my health care provider before I take this medicine? They need to know if you have any of these conditions: -bleeding disorder like hemophilia -fever or infection -Guillain-Barre syndrome or other neurological problems -immune system problems -infection with the human immunodeficiency virus  (HIV) or AIDS -low blood platelet counts -multiple sclerosis -an unusual or allergic reaction to influenza virus vaccine, other medicines, foods, dyes or preservatives -pregnant or trying to get pregnant -breast-feeding How should I use this medicine? This vaccine is for injection into a muscle. It is given by a health care professional. A copy of Vaccine Information Statements will be given before each vaccination. Read this sheet carefully each time. The sheet may change frequently. Talk to your pediatrician regarding the use of this medicine in children. Special care may be needed. Overdosage: If you think you've taken too much of this medicine contact a poison control center or emergency room at once. Overdosage: If you think you have taken too much of this medicine contact a poison control center or emergency room at once. NOTE: This medicine is only for you. Do not share this medicine with others. What if I miss a dose? This does not apply. What may interact with this medicine? -chemotherapy or radiation therapy -medicines that lower your immune system like etanercept, anakinra, infliximab, and adalimumab -medicines that treat or prevent blood clots like warfarin -phenytoin -steroid medicines like prednisone or cortisone -theophylline -vaccines This list may not describe all possible interactions. Give your health care provider a list of all the medicines, herbs, non-prescription drugs, or dietary supplements you use. Also tell them if you smoke, drink alcohol, or use illegal drugs. Some items may interact with your medicine. What should I watch for while using this medicine?  Report any side effects that do not go away within 3 days to your doctor or health care professional. Call your health care provider if any unusual symptoms occur within 6 weeks of receiving this vaccine. You may still catch the flu, but the illness is not usually as bad. You cannot get the flu from the vaccine.  The vaccine will not protect against colds or other illnesses that may cause fever. The vaccine is needed every year. What side effects may I notice from receiving this medicine? Side effects that you should report to your doctor or health care professional as soon as possible: -allergic reactions like skin rash, itching or hives, swelling of the face, lips, or tongue Side effects that usually do not require medical attention (Report these to your doctor or health care professional if they continue or are bothersome.): -fever -headache -muscle aches and pains -pain, tenderness, redness, or swelling at the injection site -tiredness This list may not describe all possible side effects. Call your doctor for medical advice about side effects. You may report side effects to FDA at 1-800-FDA-1088. Where should I keep my medicine? The vaccine will be given by a health care professional in a clinic, pharmacy, doctor's office, or other health care setting. You will not be given vaccine doses to store at home. NOTE: This sheet is a summary. It may not cover all possible information. If you have questions about this medicine, talk to your doctor, pharmacist, or health care provider.  2018 Elsevier/Gold Standard (2011-02-17 14:06:47)   Insomnia Insomnia is a sleep disorder that makes it difficult to fall asleep or to stay asleep. Insomnia can cause tiredness (fatigue), low energy, difficulty concentrating, mood swings, and poor performance at work or school. There are three different ways to classify insomnia:  Difficulty falling asleep.  Difficulty staying asleep.  Waking up too early in the morning.  Any type of insomnia can be long-term (chronic) or short-term (acute). Both are common. Short-term insomnia usually lasts for three months or less. Chronic insomnia occurs at least three times a week for longer than three months. What are the causes? Insomnia may be caused by another condition,  situation, or substance, such as:  Anxiety.  Certain medicines.  Gastroesophageal reflux disease (GERD) or other gastrointestinal conditions.  Asthma or other breathing conditions.  Restless legs syndrome, sleep apnea, or other sleep disorders.  Chronic pain.  Menopause. This may include hot flashes.  Stroke.  Abuse of alcohol, tobacco, or illegal drugs.  Depression.  Caffeine.  Neurological disorders, such as Alzheimer disease.  An overactive thyroid (hyperthyroidism).  The cause of insomnia may not be known. What increases the risk? Risk factors for insomnia include:  Gender. Women are more commonly affected than men.  Age. Insomnia is more common as you get older.  Stress. This may involve your professional or personal life.  Income. Insomnia is more common in people with lower income.  Lack of exercise.  Irregular work schedule or night shifts.  Traveling between different time zones.  What are the signs or symptoms? If you have insomnia, trouble falling asleep or trouble staying asleep is the main symptom. This may lead to other symptoms, such as:  Feeling fatigued.  Feeling nervous about going to sleep.  Not feeling rested in the morning.  Having trouble concentrating.  Feeling irritable, anxious, or depressed.  How is this treated? Treatment for insomnia depends on the cause. If your insomnia is caused by an underlying condition, treatment will focus on  addressing the condition. Treatment may also include:  Medicines to help you sleep.  Counseling or therapy.  Lifestyle adjustments.  Follow these instructions at home:  Take medicines only as directed by your health care provider.  Keep regular sleeping and waking hours. Avoid naps.  Keep a sleep diary to help you and your health care provider figure out what could be causing your insomnia. Include: ? When you sleep. ? When you wake up during the night. ? How well you sleep. ? How  rested you feel the next day. ? Any side effects of medicines you are taking. ? What you eat and drink.  Make your bedroom a comfortable place where it is easy to fall asleep: ? Put up shades or special blackout curtains to block light from outside. ? Use a white noise machine to block noise. ? Keep the temperature cool.  Exercise regularly as directed by your health care provider. Avoid exercising right before bedtime.  Use relaxation techniques to manage stress. Ask your health care provider to suggest some techniques that may work well for you. These may include: ? Breathing exercises. ? Routines to release muscle tension. ? Visualizing peaceful scenes.  Cut back on alcohol, caffeinated beverages, and cigarettes, especially close to bedtime. These can disrupt your sleep.  Do not overeat or eat spicy foods right before bedtime. This can lead to digestive discomfort that can make it hard for you to sleep.  Limit screen use before bedtime. This includes: ? Watching TV. ? Using your smartphone, tablet, and computer.  Stick to a routine. This can help you fall asleep faster. Try to do a quiet activity, brush your teeth, and go to bed at the same time each night.  Get out of bed if you are still awake after 15 minutes of trying to sleep. Keep the lights down, but try reading or doing a quiet activity. When you feel sleepy, go back to bed.  Make sure that you drive carefully. Avoid driving if you feel very sleepy.  Keep all follow-up appointments as directed by your health care provider. This is important. Contact a health care provider if:  You are tired throughout the day or have trouble in your daily routine due to sleepiness.  You continue to have sleep problems or your sleep problems get worse. Get help right away if:  You have serious thoughts about hurting yourself or someone else. This information is not intended to replace advice given to you by your health care provider.  Make sure you discuss any questions you have with your health care provider. Document Released: 03/05/2000 Document Revised: 08/08/2015 Document Reviewed: 12/07/2013 Elsevier Interactive Patient Education  [employer].

## 2016-11-19 NOTE — Progress Notes (Signed)
Pre visit review using our clinic review tool, if applicable. No additional management support is needed unless otherwise documented below in the visit note. 

## 2016-11-23 NOTE — Telephone Encounter (Signed)
Please check on this. pts last CPE was on 04/05/16.

## 2016-11-23 NOTE — Telephone Encounter (Signed)
I have left voice mail for patient to call back to reschedule appt.

## 2016-12-27 IMAGING — MR MR SHOULDER*R* W/O CM
4 of 5 series · 18 of 40 positions shown · non-contrast
Comparison: None.

CLINICAL DATA: Right shoulder weakness since falling and
dislocating shoulder March 10, 2014.

EXAM:
MRI OF THE RIGHT SHOULDER WITHOUT CONTRAST
TECHNIQUE: Multiplanar, multisequence MR imaging of the shoulder was performed.
No intravenous contrast was administered.

[Series 5: T2 fat-sat · axial · 4.0mm · 0.25mm/px · z∈[-6,+60]mm · 8 of 16 slices shown (1 of 2)]
[im 1/16]
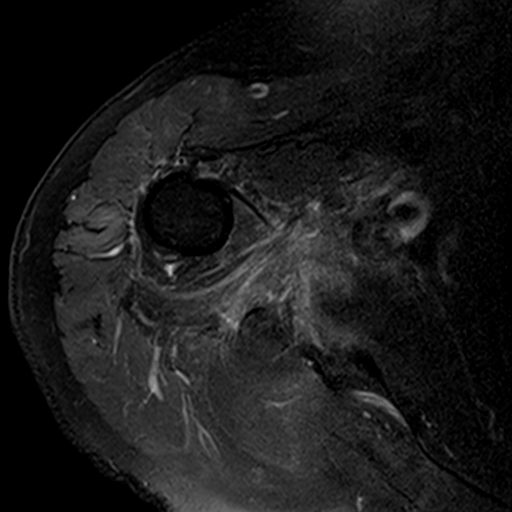
[im 3/16]
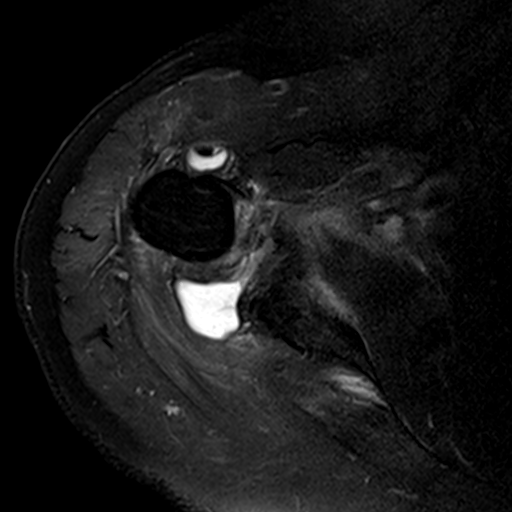
[im 5/16]
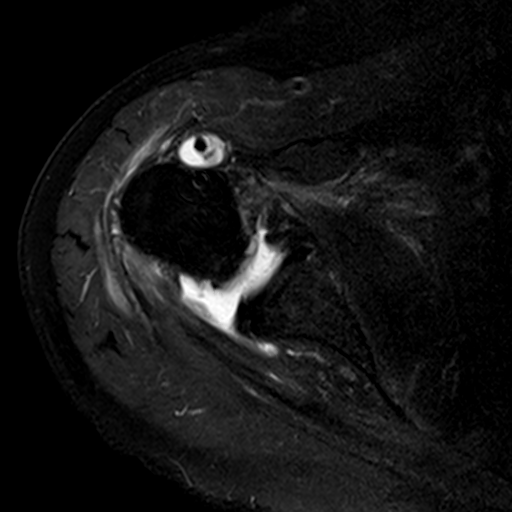
[im 7/16]
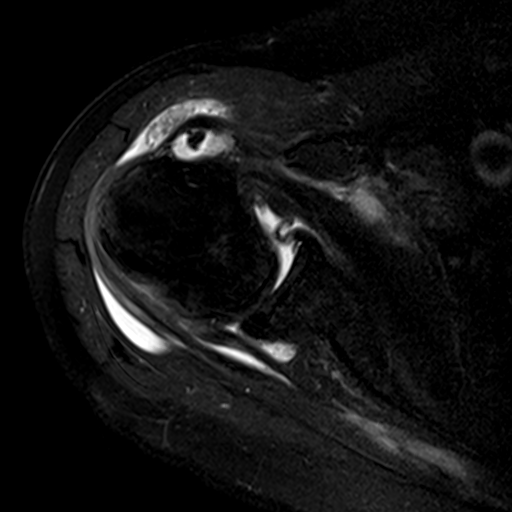
[im 9/16]
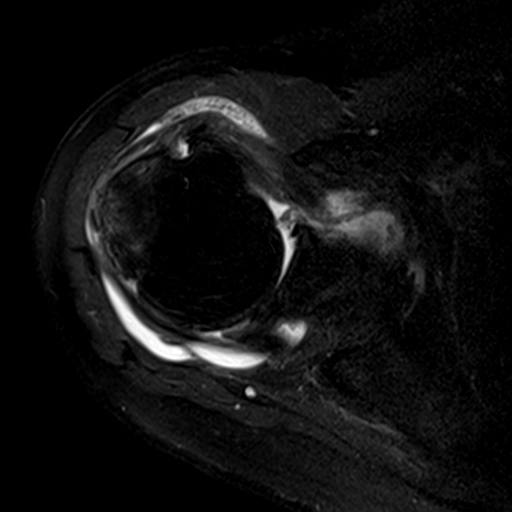
[im 11/16]
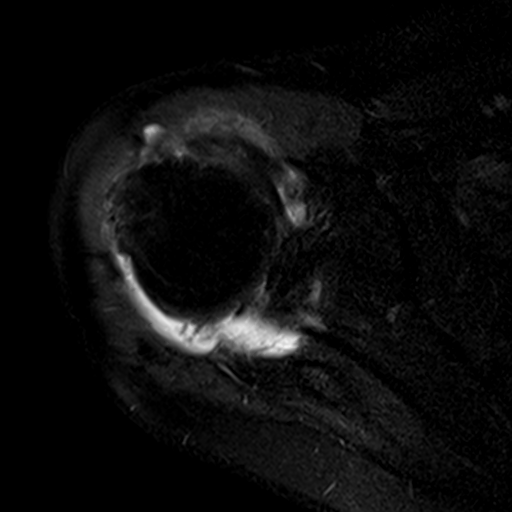
[im 13/16]
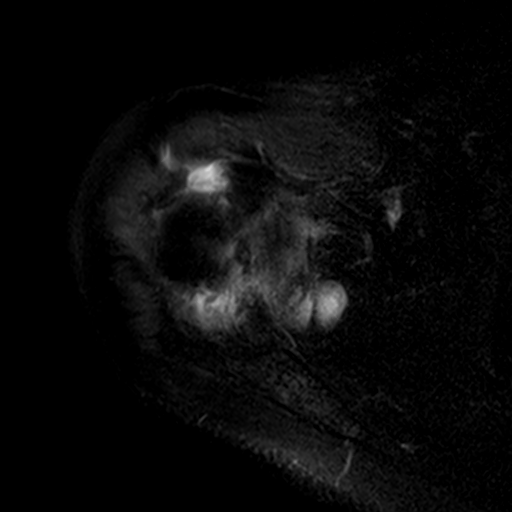
[im 16/16]
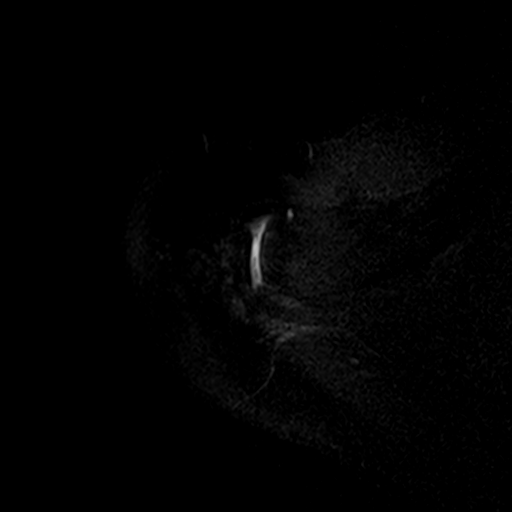

[Series 7: t2_tse_cor · oblique · 4.0mm · 0.31mm/px · 3 of 16 slices shown]
[im 3/16]
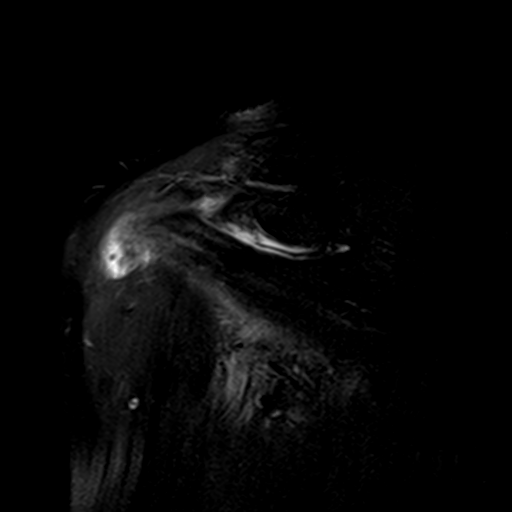
[im 9/16]
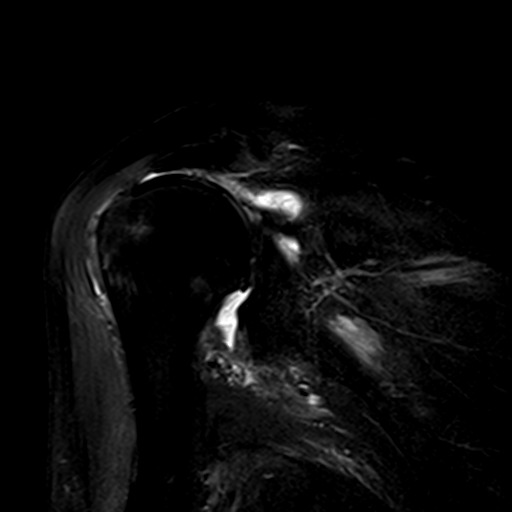
[im 13/16]
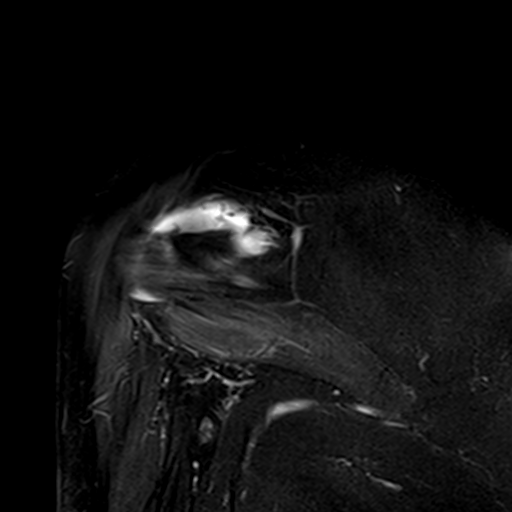

[Series 9: T1 · oblique · 4.0mm · 0.31mm/px · 3 of 16 slices shown]
[im 3/16]
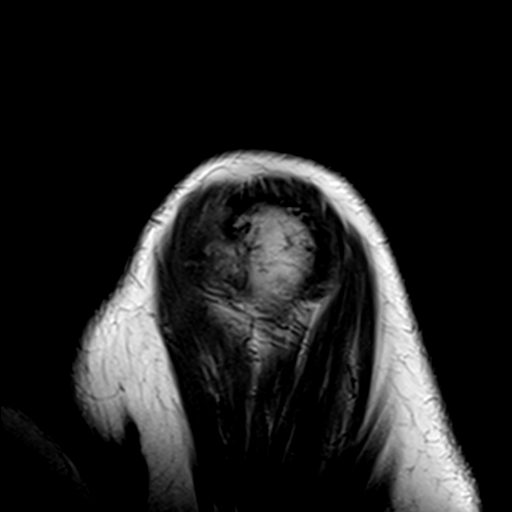
[im 9/16]
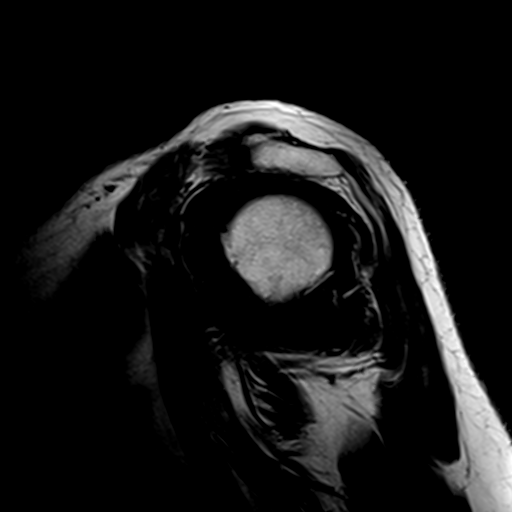
[im 13/16]
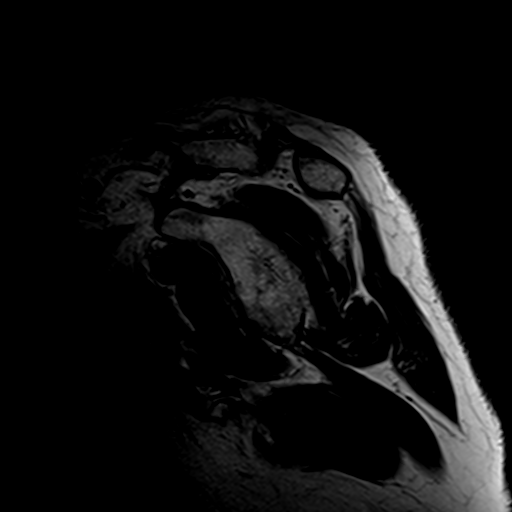

[Series 11: T2 fat-sat · oblique · 4.0mm · 0.31mm/px · 4 of 16 slices shown (2 of 2)]
[im 1/16]
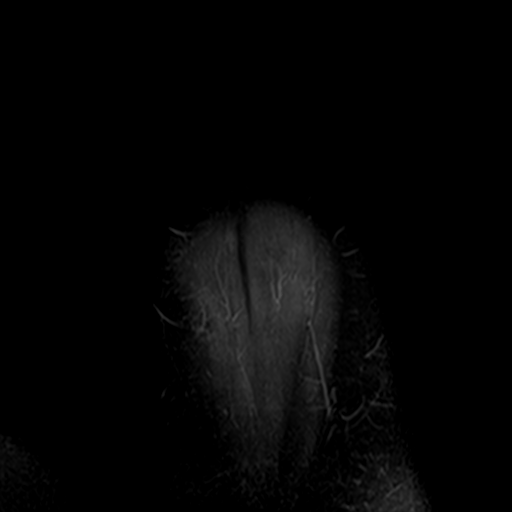
[im 3/16]
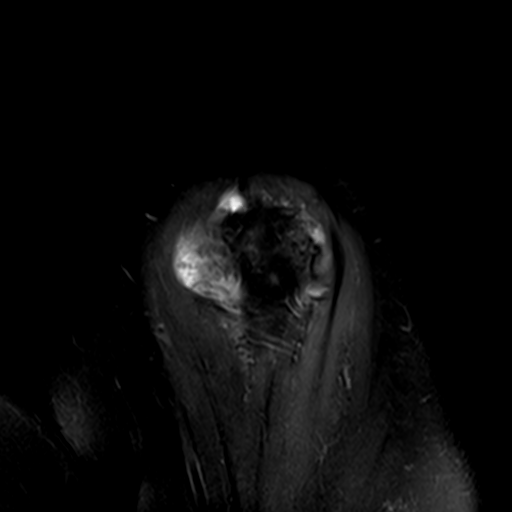
[im 9/16]
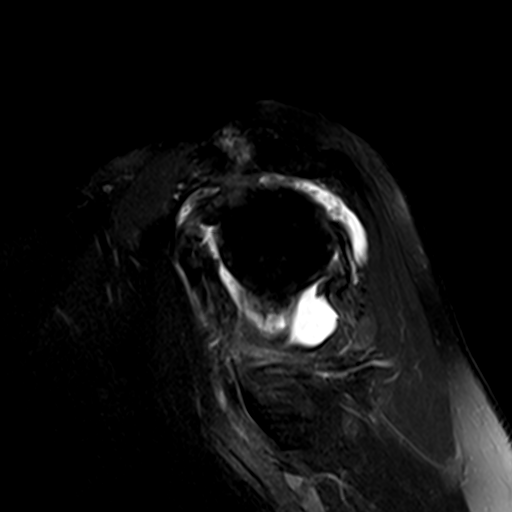
[im 13/16]
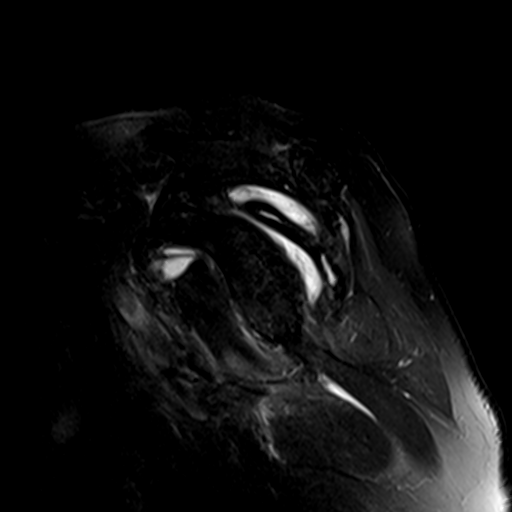

[18 of 40 positions shown; findings below may reference images not displayed]

FINDINGS: Rotator cuff: Complete tear of the supraspinatus tendon with 4.3 cm
of retraction. Severe tendinosis of the infraspinatus tendon with
full-thickness tear of the anterior half of the infraspinatus
tendon. Teres minor tendon is intact. Moderate tendinosis of the
subscapularis tendon.

Muscles: Severe atrophy of the infraspinatus muscle. Mild atrophy of
the supraspinatus muscle. No subscapularis or teres minor muscle
atrophy.

Biceps long head: Severe tendinosis of the intraarticular portion of
the biceps tendon.

Acromioclavicular Joint: Moderate degenerative changes of the
acromioclavicular joint. Type I acromion. Moderate amount of
subacromial/subdeltoid bursal fluid with low signal foci within the
a fluid which may reflect debris or synovitis.

Glenohumeral Joint: Small joint effusion. Mild chondromalacia of the
glenohumeral joint. Attenuation and edema of the inferior
glenohumeral ligament most concerning for humeral avulsion of the
glenohumeral ligament.

Labrum: Grossly intact, but evaluation is limited by lack of
intraarticular fluid.

Bones: Mild edema in the superolateral humeral head likely
reflecting Hill-Sachs lesion. No acute fracture or dislocation.
IMPRESSION: 1. Complete tear of supraspinatus tendon with  4.3 cm of retraction.
2. Severe tendinosis of the infraspinatus tendon with a
full-thickness tear of the anterior half of the infraspinatus
tendon. Severe atrophy of the infraspinatus muscle.
3. Moderate tendinosis of the subscapularis tendon.
4. Severe tenderness of the intraarticular portion of the biceps
tendon.
5. Findings concerning for humeral avulsion of the inferior
glenohumeral ligament.
6. Mild edema in the superolateral humeral head likely reflecting a
Hill-Sachs lesion given the history of recent dislocation. No
Bankart lesion.

## 2017-01-05 DIAGNOSIS — H524 Presbyopia: Secondary | ICD-10-CM | POA: Diagnosis not present

## 2017-01-05 DIAGNOSIS — L820 Inflamed seborrheic keratosis: Secondary | ICD-10-CM | POA: Diagnosis not present

## 2017-01-05 DIAGNOSIS — Z85828 Personal history of other malignant neoplasm of skin: Secondary | ICD-10-CM | POA: Diagnosis not present

## 2017-02-21 DIAGNOSIS — Z1231 Encounter for screening mammogram for malignant neoplasm of breast: Secondary | ICD-10-CM | POA: Diagnosis not present

## 2017-02-21 DIAGNOSIS — Z01419 Encounter for gynecological examination (general) (routine) without abnormal findings: Secondary | ICD-10-CM | POA: Diagnosis not present

## 2017-02-21 LAB — HM MAMMOGRAPHY

## 2017-02-24 ENCOUNTER — Encounter: Payer: Self-pay | Admitting: Internal Medicine

## 2017-03-02 ENCOUNTER — Ambulatory Visit: Payer: PPO | Admitting: Internal Medicine

## 2017-03-02 ENCOUNTER — Other Ambulatory Visit: Payer: Self-pay | Admitting: Internal Medicine

## 2017-03-02 ENCOUNTER — Encounter: Payer: Self-pay | Admitting: Internal Medicine

## 2017-03-02 ENCOUNTER — Ambulatory Visit (INDEPENDENT_AMBULATORY_CARE_PROVIDER_SITE_OTHER)
Admission: RE | Admit: 2017-03-02 | Discharge: 2017-03-02 | Disposition: A | Discharge status: Home / Self Care | Payer: PPO | Source: Ambulatory Visit | Attending: Internal Medicine | Admitting: Internal Medicine

## 2017-03-02 VITALS — BP 122/70 | HR 81 | Temp 98.9°F | Resp 16 | Wt 147.0 lb

## 2017-03-02 DIAGNOSIS — R05 Cough: Secondary | ICD-10-CM | POA: Diagnosis not present

## 2017-03-02 DIAGNOSIS — R059 Cough, unspecified: Secondary | ICD-10-CM

## 2017-03-02 DIAGNOSIS — J189 Pneumonia, unspecified organism: Secondary | ICD-10-CM

## 2017-03-02 DIAGNOSIS — R062 Wheezing: Secondary | ICD-10-CM

## 2017-03-02 DIAGNOSIS — J181 Lobar pneumonia, unspecified organism: Principal | ICD-10-CM

## 2017-03-02 MED ORDER — DOXYCYCLINE HYCLATE 100 MG PO TABS: 100.0000 mg | ORAL_TABLET | Freq: Two times a day (BID) | ORAL | 0 refills | Status: DC

## 2017-03-02 MED ORDER — METHYLPREDNISOLONE 4 MG PO TBPK: ORAL_TABLET | ORAL | 0 refills | Status: DC

## 2017-03-02 NOTE — Progress Notes (Signed)
Subjective:    Patient ID: Sherry Mitchell, female    DOB: 28-Apr-1945, 71 y.o.   MRN: [id number]  HPI She is here for an acute visit for cold symptoms.  Her symptoms started 4 days ago.    She is experiencing a productive cough or green sputum, she has had difficulty sleeping due to the cough, headache, lightheaded, fatigue, no appetite, fever x 2 days, 100.9, mild body aches, mild wheeze at night.    She denies sore throat, ear pain and SOB.    She has taken comtrex, advil.    Medications and allergies reviewed with patient and updated if appropriate.  Patient Active Problem List   Diagnosis Date Noted  . Osteoporosis 04/24/2016  . Osteoarthritis   . Hypertension   . Colon polyps     Current Outpatient Medications on File Prior to Visit  Medication Sig Dispense Refill  . Calcium Carbonate-Vitamin D (CALCIUM-D PO) Take 1 tablet by mouth.    . diltiazem (DILT-XR) 240 MG 24 hr capsule Take 1 capsule (240 mg total) by mouth daily. 90 capsule 3  . lisinopril-hydrochlorothiazide (PRINZIDE,ZESTORETIC) 20-25 MG tablet Take 1 tablet by mouth daily. 90 tablet 3   No current facility-administered medications on file prior to visit.     Past Medical History:  Diagnosis Date  . Colon polyps 02/2003, 02/2009   Adenomatous polyps  . Hypertension   . Osteoarthritis    hands  . Osteopenia    DEXA 02/2011, 02/2013: -1.9    Past Surgical History:  Procedure Laterality Date  . BREAST ENHANCEMENT SURGERY  1987  . COLONOSCOPY      Social History   Socioeconomic History  . Marital status: Married    Spouse name: None  . Number of children: None  . Years of education: None  . Highest education level: None  Social Needs  . Financial resource strain: None  . Food insecurity - worry: None  . Food insecurity - inability: None  . Transportation needs - medical: None  . Transportation needs - non-medical: None  Occupational History  . None  Tobacco Use  . Smoking status:  Never Smoker  . Smokeless tobacco: Never Used  Substance and Sexual Activity  . Alcohol use: Yes    Comment: wine with dinner  . Drug use: No  . Sexual activity: None  Other Topics Concern  . None  Social History Narrative   Exercises regularly    Family History  Problem Relation Age of Onset  . Cancer Mother        cancer of saliva glands  . Heart disease Other   . Colon cancer Neg Hx   . Esophageal cancer Neg Hx   . Stomach cancer Neg Hx   . Rectal cancer Neg Hx     Review of Systems  Constitutional: Positive for appetite change (decreased), fatigue and fever.  HENT: Positive for congestion and sinus pressure. Negative for ear pain, sinus pain and sore throat.   Respiratory: Positive for cough and wheezing. Negative for shortness of breath.   Cardiovascular: Negative for chest pain.  Gastrointestinal: Negative for diarrhea and nausea.  Musculoskeletal: Positive for myalgias.  Neurological: Positive for light-headedness and headaches.       Objective:   Vitals:   03/02/17 1300  BP: 122/70  Pulse: 81  Resp: 16  Temp: 98.9 F (37.2 C)  SpO2: 94%   Filed Weights   03/02/17 1300  Weight: 147 lb (66.7 kg)   Body  mass index is 23.02 kg/m.  Wt Readings from Last 3 Encounters:  03/02/17 147 lb (66.7 kg)  11/19/16 153 lb (69.4 kg)  04/05/16 151 lb (68.5 kg)     Physical Exam GENERAL APPEARANCE: Appears stated age, well appearing, NAD EYES: conjunctiva clear, no icterus HEENT: bilateral tympanic membranes and ear canals normal, oropharynx with mild erythema, no thyromegaly, trachea midline, no cervical or supraclavicular lymphadenopathy LUNGS: unlabored breathing, good air entry bilaterally. Rhonchi b/l R > L CARDIOVASCULAR: Normal S1,S2 without murmurs, no edema SKIN: warm, dry        Assessment & Plan:   See Problem List for Assessment and Plan of chronic medical problems.

## 2017-03-02 NOTE — Assessment & Plan Note (Signed)
Productive cough, wheeze Bronchitis, possible PNA CXR today Doxycycline, medrol dose pak otc cold medications Call if no improvement

## 2017-03-02 NOTE — Patient Instructions (Signed)
Get a chest xray today.    Test(s) ordered today. Your results will be released to [place] (or called to you) after review, usually within 72hours after test completion. If any changes need to be made, you will be notified at that same time.  Medications reviewed and updated.  Changes include taking an antibiotic and a steroid for your cold symptoms.    Your prescription(s) have been submitted to your pharmacy or been printed and provided for you. Please take as directed and contact our office if you believe you are having problem(s) with the medication(s) or have any questions.  If your symptoms worsen or fail to improve, please contact our office for further instruction, or in case of emergency go directly to the emergency room at the closest medical facility.   General Recommendations:    Please drink plenty of fluids.  Get plenty of rest   Sleep in humidified air  Use saline nasal sprays  Netti pot  OTC Medications:  Decongestants - helps relieve congestion   Flonase (generic fluticasone) or Nasacort (generic triamcinolone) - please make sure to use the "cross-over" technique at a 45 degree angle towards the opposite eye as opposed to straight up the nasal passageway.   Sudafed (generic pseudoephedrine - Note this is the one that is available behind the pharmacy counter); Products with phenylephrine (-PE) may also be used but is often not as effective as pseudoephedrine.   If you have HIGH BLOOD PRESSURE - Coricidin HBP; AVOID any product that is -D as this contains pseudoephedrine which may increase your blood pressure.  Afrin (oxymetazoline) every 6-8 hours for up to 3 days.  Allergies - helps relieve runny nose, itchy eyes and sneezing   Claritin (generic loratidine), Allegra (fexofenidine), or Zyrtec (generic cyrterizine) for runny nose. These medications should not cause drowsiness.  Note - Benadryl (generic diphenhydramine) may be used however may cause  drowsiness  Cough -   Delsym or Robitussin (generic dextromethorphan)  Expectorants - helps loosen mucus to ease removal   Mucinex (generic guaifenesin) as directed on the package.  Headaches / General Aches   Tylenol (generic acetaminophen) - DO NOT EXCEED 3 grams (3,000 mg) in a 24 hour time period  Advil/Motrin (generic ibuprofen)  Sore Throat -   Salt water gargle   Chloraseptic (generic benzocaine) spray or lozenges / Sucrets (generic dyclonine)

## 2017-03-11 ENCOUNTER — Ambulatory Visit: Payer: PPO | Admitting: Internal Medicine

## 2017-03-27 ENCOUNTER — Emergency Department (HOSPITAL_COMMUNITY): Payer: Medicare HMO

## 2017-03-27 ENCOUNTER — Encounter (HOSPITAL_COMMUNITY): Payer: Self-pay | Admitting: *Deleted

## 2017-03-27 ENCOUNTER — Emergency Department (HOSPITAL_COMMUNITY)
Admission: EM | Admit: 2017-03-27 | Discharge: 2017-03-28 | Disposition: A | Discharge status: Home / Self Care | Payer: Medicare HMO | Attending: Emergency Medicine | Admitting: Emergency Medicine

## 2017-03-27 ENCOUNTER — Other Ambulatory Visit: Payer: Self-pay

## 2017-03-27 DIAGNOSIS — R0789 Other chest pain: Secondary | ICD-10-CM | POA: Insufficient documentation

## 2017-03-27 DIAGNOSIS — R918 Other nonspecific abnormal finding of lung field: Secondary | ICD-10-CM | POA: Diagnosis not present

## 2017-03-27 DIAGNOSIS — I10 Essential (primary) hypertension: Secondary | ICD-10-CM | POA: Diagnosis not present

## 2017-03-27 DIAGNOSIS — R079 Chest pain, unspecified: Secondary | ICD-10-CM | POA: Diagnosis present

## 2017-03-27 DIAGNOSIS — Z79899 Other long term (current) drug therapy: Secondary | ICD-10-CM | POA: Diagnosis not present

## 2017-03-27 LAB — CBC
HCT: 39.3 % (ref 36.0–46.0)
Hemoglobin: 13 g/dL (ref 12.0–15.0)
MCH: 29.6 pg (ref 26.0–34.0)
MCHC: 33.1 g/dL (ref 30.0–36.0)
MCV: 89.5 fL (ref 78.0–100.0)
PLATELETS: 292 10*3/uL (ref 150–400)
RBC: 4.39 MIL/uL (ref 3.87–5.11)
RDW: 13.3 % (ref 11.5–15.5)
WBC: 6.6 10*3/uL (ref 4.0–10.5)

## 2017-03-27 LAB — BASIC METABOLIC PANEL
Anion gap: 9 (ref 5–15)
BUN: 9 mg/dL (ref 6–20)
CALCIUM: 9.3 mg/dL (ref 8.9–10.3)
CO2: 25 mmol/L (ref 22–32)
CREATININE: 0.52 mg/dL (ref 0.44–1.00)
Chloride: 99 mmol/L — ABNORMAL LOW (ref 101–111)
GFR calc non Af Amer: >60 mL/min (ref >60)
Glucose, Bld: 103 mg/dL — ABNORMAL HIGH (ref 65–99)
Potassium: 3.4 mmol/L — ABNORMAL LOW (ref 3.5–5.1)
SODIUM: 133 mmol/L — AB (ref 135–145)

## 2017-03-27 LAB — I-STAT TROPONIN, ED: TROPONIN I, POC: 0 ng/mL (ref 0.00–0.08)

## 2017-03-27 MED ORDER — SODIUM CHLORIDE 0.9 % IV BOLUS (SEPSIS)
1000.0000 mL | Freq: Once | INTRAVENOUS | Status: AC
  Administered 2017-03-28: 1000 mL via INTRAVENOUS

## 2017-03-27 NOTE — ED Triage Notes (Addendum)
Pt states 4 episodes, each lasting 15 sec of a burning sensation I her chest.  Pain was as if it was rising in her sternum.  Denies nausea, dizziness or sob.  States finished dose of doxy and prednisone for pneumonia 2 weeks ago. Airplane trip 1 month ago.

## 2017-03-27 NOTE — ED Provider Notes (Signed)
[facility] EMERGENCY DEPARTMENT Provider Note   CSN: [id number] Arrival date & time: 03/27/17  1521     History   Chief Complaint Chief Complaint  Patient presents with  . Chest Pain    HPI Sherry Mitchell is a 72 y.o. female with a past medical history of hypertension, osteoarthritis, who presents to ED for evaluation of "burning sensation" in the middle of her chest that began today.  She states that she has been having 4 episodes that each lasted 10-15 seconds between the hours of 11 AM and 3 PM. She reports one episode since arrival in the ED while in the waiting room.  States that her symptoms have completely resolved since then.  No previous history of similar symptoms.  She completed her course of doxycycline and prednisone for pneumonia about a week ago.  She reports improvement in her cough and other URI symptoms since then.  She denies any wheezing, shortness of breath, nausea, vomiting, abdominal pain, hemoptysis, leg swelling, recent surgeries, recent prolonged travel, history of MI, DVT or PE.  She reports compliance with her home blood pressure medications. She reports daily alcohol use (one glass of wine nightly with dinner), but denies any tobacco or other drug use.  HPI  Past Medical History:  Diagnosis Date  . Colon polyps 02/2003, 02/2009   Adenomatous polyps  . Hypertension   . Osteoarthritis    hands  . Osteopenia    DEXA 02/2011, 02/2013: -1.9    Patient Active Problem List   Diagnosis Date Noted  . Cough 03/02/2017  . Wheeze 03/02/2017  . Osteoporosis 04/24/2016  . Osteoarthritis   . Hypertension   . Colon polyps     Past Surgical History:  Procedure Laterality Date  . BREAST ENHANCEMENT SURGERY  1987  . COLONOSCOPY      OB History    No data available       Home Medications    Prior to Admission medications   Medication Sig Start Date End Date Taking? Authorizing Provider  Calcium Carbonate-Vitamin D (CALCIUM-D PO)  Take 1 tablet by mouth.    [provider]  diltiazem (DILT-XR) 240 MG 24 hr capsule Take 1 capsule (240 mg total) by mouth daily. 04/05/16   Binnie Rail, MD  doxycycline (VIBRA-TABS) 100 MG tablet Take 1 tablet (100 mg total) by mouth 2 (two) times daily. 03/02/17   Binnie Rail, MD  lisinopril-hydrochlorothiazide (PRINZIDE,ZESTORETIC) 20-25 MG tablet Take 1 tablet by mouth daily. 04/05/16   Binnie Rail, MD  methylPREDNISolone (MEDROL DOSEPAK) 4 MG TBPK tablet 24 mg PO on day 1, then decr. by 4 mg/day x5 days 03/02/17   Binnie Rail, MD    Family History Family History  Problem Relation Age of Onset  . Cancer Mother        cancer of saliva glands  . Heart disease Other   . Colon cancer Neg Hx   . Esophageal cancer Neg Hx   . Stomach cancer Neg Hx   . Rectal cancer Neg Hx     Social History Social History   Tobacco Use  . Smoking status: Never Smoker  . Smokeless tobacco: Never Used  Substance Use Topics  . Alcohol use: Yes    Comment: wine with dinner  . Drug use: No     Allergies   Patient has no known allergies.   Review of Systems Review of Systems  Constitutional: Negative for appetite change, chills and fever.  HENT: Negative for ear pain, rhinorrhea, sneezing and sore throat.   Eyes: Negative for photophobia and visual disturbance.  Respiratory: Negative for cough, chest tightness, shortness of breath and wheezing.   Cardiovascular: Positive for chest pain. Negative for palpitations.  Gastrointestinal: Negative for abdominal pain, blood in stool, constipation, diarrhea, nausea and vomiting.  Genitourinary: Negative for dysuria, hematuria and urgency.  Musculoskeletal: Negative for myalgias.  Skin: Negative for rash.  Neurological: Negative for dizziness, weakness and light-headedness.     Physical Exam Updated Vital Signs BP (!) 143/69   Pulse 63   Temp 98.6 F (37 C) (Oral)   Resp 19   Ht 5\' 7"  (1.702 m)   Wt 64.4 kg (142 lb)   SpO2  100%   BMI 22.24 kg/m   Physical Exam  Constitutional: She appears well-developed and well-nourished. No distress.  Nontoxic appearing and in no acute distress. Speaking in complete sentences without difficulty.  HENT:  Head: Normocephalic and atraumatic.  Nose: Nose normal.  Eyes: Conjunctivae and EOM are normal. Left eye exhibits no discharge. No scleral icterus.  Neck: Normal range of motion. Neck supple.  Cardiovascular: Normal rate, regular rhythm, normal heart sounds and intact distal pulses. Exam reveals no gallop and no friction rub.  No murmur heard. Pulmonary/Chest: Effort normal and breath sounds normal. No respiratory distress.  Reported discomfort in this area when symptoms occurred.     Abdominal: Soft. Bowel sounds are normal. She exhibits no distension. There is no tenderness. There is no guarding.  Musculoskeletal: Normal range of motion. She exhibits no edema.  No BLE edema, erythema or calf tenderness.  Neurological: She is alert. She exhibits normal muscle tone. Coordination normal.  Skin: Skin is warm and dry. No rash noted.  Psychiatric: She has a normal mood and affect.  Nursing note and vitals reviewed.    ED Treatments / Results  Labs (all labs ordered are listed, but only abnormal results are displayed) Labs Reviewed  BASIC METABOLIC PANEL - Abnormal; Notable for the following components:      Result Value   Sodium 133 (*)    Potassium 3.4 (*)    Chloride 99 (*)    Glucose, Bld 103 (*)    All other components within normal limits  HEPATIC FUNCTION PANEL - Abnormal; Notable for the following components:   Bilirubin, Direct <0.1 (*)    All other components within normal limits  CBC  LIPASE, BLOOD  I-STAT TROPONIN, ED  I-STAT TROPONIN, ED    EKG  EKG Interpretation  Date/Time:  Sunday March 27 2017 16:09:59 EST Ventricular Rate:  59 PR Interval:  140 QRS Duration: 98 QT Interval:  412 QTC Calculation: 407 R Axis:   64 Text  Interpretation:  Sinus bradycardia Nonspecific ST abnormality Abnormal ECG No previous ECGs available Confirmed by Theotis Burrow [phone]) on 03/27/2017 11:51:21 PM       Radiology Dg Chest 2 View  Result Date: 03/27/2017 CLINICAL DATA:  Burning sensation radiating to the sites today, history of hypertension EXAM: CHEST  2 VIEW COMPARISON:  03/02/2017 FINDINGS: Normal heart size, mediastinal contours, and pulmonary vascularity. Lungs hyperinflated but clear. No acute infiltrate, pleural effusion or pneumothorax. BILATERAL breast prostheses. Bones demineralized. IMPRESSION: Hyperinflated lungs without acute infiltrate. Electronically Signed   By: Lavonia Dana M.D.   On: 03/27/2017 16:37    Procedures Procedures (including critical care time)  Medications Ordered in ED Medications  sodium chloride 0.9 % bolus 1,000 mL (0 mLs Intravenous Stopped 03/28/17 0119)  Initial Impression / Assessment and Plan / ED Course  I have reviewed the triage vital signs and the nursing notes.  Pertinent labs & imaging results that were available during my care of the patient were reviewed by me and considered in my medical decision making (see chart for details).     Patient, with a past medical history of hypertension, osteoarthritis, presents to ED for evaluation of "burning sensation" in the middle of her chest that began today.  States that she has been having 4 episodes that each lasted 10-15 seconds between the hours of 11 AM and 3 PM.  Reports one similar although episodes since arrival in the ED while in the waiting room.  Since being roomed, she states that her symptoms have resolved.  Recently completedher course of doxycycline and prednisone for pneumonia about a week ago.  She denies any wheezing, shortness of breath, nausea, vomiting, abdominal pain, history of MI, DVT or PE, recent surgeries, recent prolonged travel.  On physical exam patient is overall well-appearing.  She states that when she had  the discomfort was located in the epigastric region as indicated in the image above.  She is afebrile with no history of fever.  She is not tachycardic or tachypneic.  She is satting at 100% on room air.  She is speaking complete sentences with no signs of respiratory distress or airway compromise.  Initial troponin was negative.  CBC, BMP was unremarkable.  Due to location of discomfort, LFTs and lipase were obtained which returned as negative as well.  Chest x-ray was unremarkable.  EKG with no ischemic changes.  Patient remained symptom-free here in the ED.  She low risk for DVT or PE according to Wells criteria.  I do not believe this is the cause of her symptoms.  She is also low risk for cardiac cause of her symptoms.  Her home medications as previously prescribed and to follow-up with her primary care provider for further evaluation.  Patient appears stable for discharge at this time.  Strict return precautions given.  Patient discussed with and seen Dr. Rex Kras.  Final Clinical Impressions(s) / ED Diagnoses   Final diagnoses:  Chest wall pain    ED Discharge Orders    None     Portions of this note were generated with Dragon dictation software. Dictation errors may occur despite best attempts at proofreading.    Delia Heady, PA-C 03/28/17 0124    Little, Wenda Overland, MD 03/28/17 2151

## 2017-03-28 DIAGNOSIS — I10 Essential (primary) hypertension: Secondary | ICD-10-CM | POA: Diagnosis not present

## 2017-03-28 DIAGNOSIS — Z79899 Other long term (current) drug therapy: Secondary | ICD-10-CM | POA: Diagnosis not present

## 2017-03-28 DIAGNOSIS — R0789 Other chest pain: Secondary | ICD-10-CM | POA: Diagnosis not present

## 2017-03-28 LAB — HEPATIC FUNCTION PANEL
ALT: 16 U/L (ref 14–54)
AST: 19 U/L (ref 15–41)
Albumin: 3.5 g/dL (ref 3.5–5.0)
Alkaline Phosphatase: 71 U/L (ref 38–126)
Bilirubin, Direct: <0.1 mg/dL — ABNORMAL LOW (ref 0.1–0.5)
TOTAL PROTEIN: 6.6 g/dL (ref 6.5–8.1)
Total Bilirubin: 1 mg/dL (ref 0.3–1.2)

## 2017-03-28 LAB — I-STAT TROPONIN, ED: TROPONIN I, POC: 0 ng/mL (ref 0.00–0.08)

## 2017-03-28 LAB — LIPASE, BLOOD: LIPASE: 24 U/L (ref 11–51)

## 2017-03-28 NOTE — Discharge Instructions (Signed)
Please read attached information regarding your condition. Continue your home medications as previously prescribed. Follow-up with your primary care provider for further evaluation. Return to ED for worsening symptoms, increased chest pain, trouble breathing, coughing up blood, leg swelling.

## 2017-03-28 NOTE — ED Notes (Signed)
Pt understood dc material. NAD noted. 

## 2017-04-04 ENCOUNTER — Other Ambulatory Visit: Payer: Self-pay | Admitting: Emergency Medicine

## 2017-04-04 ENCOUNTER — Other Ambulatory Visit (INDEPENDENT_AMBULATORY_CARE_PROVIDER_SITE_OTHER): Payer: Medicare HMO

## 2017-04-04 DIAGNOSIS — I10 Essential (primary) hypertension: Secondary | ICD-10-CM | POA: Diagnosis not present

## 2017-04-04 DIAGNOSIS — Z0000 Encounter for general adult medical examination without abnormal findings: Secondary | ICD-10-CM

## 2017-04-04 LAB — CBC WITH DIFFERENTIAL/PLATELET
BASOS ABS: 0.1 10*3/uL (ref 0.0–0.1)
Basophils Relative: 1.5 % (ref 0.0–3.0)
EOS ABS: 0.1 10*3/uL (ref 0.0–0.7)
Eosinophils Relative: 1.4 % (ref 0.0–5.0)
HEMATOCRIT: 42.1 % (ref 36.0–46.0)
Hemoglobin: 13.9 g/dL (ref 12.0–15.0)
LYMPHS PCT: 44.8 % (ref 12.0–46.0)
Lymphs Abs: 2.4 10*3/uL (ref 0.7–4.0)
MCHC: 33 g/dL (ref 30.0–36.0)
MCV: 93 fl (ref 78.0–100.0)
MONOS PCT: 12.3 % — AB (ref 3.0–12.0)
Monocytes Absolute: 0.6 10*3/uL (ref 0.1–1.0)
NEUTROS ABS: 2.1 10*3/uL (ref 1.4–7.7)
NEUTROS PCT: 40 % — AB (ref 43.0–77.0)
PLATELETS: 274 10*3/uL (ref 150.0–400.0)
RBC: 4.53 Mil/uL (ref 3.87–5.11)
RDW: 13.8 % (ref 11.5–15.5)
WBC: 5.2 10*3/uL (ref 4.0–10.5)

## 2017-04-04 LAB — COMPREHENSIVE METABOLIC PANEL
ALBUMIN: 4.2 g/dL (ref 3.5–5.2)
ALK PHOS: 66 U/L (ref 39–117)
ALT: 10 U/L (ref 0–35)
AST: 11 U/L (ref 0–37)
BILIRUBIN TOTAL: 0.7 mg/dL (ref 0.2–1.2)
BUN: 12 mg/dL (ref 6–23)
CALCIUM: 9.9 mg/dL (ref 8.4–10.5)
CO2: 29 meq/L (ref 19–32)
CREATININE: 0.6 mg/dL (ref 0.40–1.20)
Chloride: 103 mEq/L (ref 96–112)
GFR: 104.54 mL/min (ref >60.00)
Glucose, Bld: 106 mg/dL — ABNORMAL HIGH (ref 70–99)
Potassium: 3.6 mEq/L (ref 3.5–5.1)
Sodium: 140 mEq/L (ref 135–145)
TOTAL PROTEIN: 7.2 g/dL (ref 6.0–8.3)

## 2017-04-04 LAB — LIPID PANEL
CHOL/HDL RATIO: 3
Cholesterol: 198 mg/dL (ref 0–200)
HDL: 61.5 mg/dL (ref >39.00)
LDL Cholesterol: 122 mg/dL — ABNORMAL HIGH (ref 0–99)
NonHDL: 136.69
TRIGLYCERIDES: 71 mg/dL (ref 0.0–149.0)
VLDL: 14.2 mg/dL (ref 0.0–40.0)

## 2017-04-04 LAB — TSH: TSH: 1.09 u[IU]/mL (ref 0.35–4.50)

## 2017-04-06 ENCOUNTER — Encounter: Payer: PPO | Admitting: Internal Medicine

## 2017-04-07 NOTE — Progress Notes (Signed)
Subjective:    Patient ID: Sherry Mitchell, female    DOB: Oct 16, 1945, 72 y.o.   MRN: [id number]  HPI She is here for a physical exam.   She was in the emergency room earlier this month for chest wall pain/sensation.  Evaluation in the emergency room showed no obvious cause.  She described it has a burning sensation that would come and go a few times.  She has not had any recurrences of chest pain or sensation in her chest.       She is leaving for vacation - [place] soon and is concerned about getting sick.  She wonders about taking something with her.  she had pneumonia recently and is concerned about getting that sick again.  Cough - it started two days ago.  She is coughing up white phlegm.  She has had a headache and some nasal congestion.    She has no other concerns.  Medications and allergies reviewed with patient and updated if appropriate.  Patient Active Problem List   Diagnosis Date Noted  . Cough 03/02/2017  . Wheeze 03/02/2017  . Osteoporosis 04/24/2016  . Osteoarthritis   . Hypertension   . Colon polyps     Current Outpatient Medications on File Prior to Visit  Medication Sig Dispense Refill  . Calcium Carbonate-Vitamin D (CALCIUM-D PO) Take 1 tablet by mouth.    . diltiazem (DILT-XR) 240 MG 24 hr capsule Take 1 capsule (240 mg total) by mouth daily. 90 capsule 3  . lisinopril-hydrochlorothiazide (PRINZIDE,ZESTORETIC) 20-25 MG tablet Take 1 tablet by mouth daily. 90 tablet 3   No current facility-administered medications on file prior to visit.     Past Medical History:  Diagnosis Date  . Colon polyps 02/2003, 02/2009   Adenomatous polyps  . Hypertension   . Osteoarthritis    hands  . Osteopenia    DEXA 02/2011, 02/2013: -1.9    Past Surgical History:  Procedure Laterality Date  . BREAST ENHANCEMENT SURGERY  1987  . COLONOSCOPY      Social History   Socioeconomic History  . Marital status: Married    Spouse name: Not on file  . Number of  children: Not on file  . Years of education: Not on file  . Highest education level: Not on file  Social Needs  . Financial resource strain: Not on file  . Food insecurity - worry: Not on file  . Food insecurity - inability: Not on file  . Transportation needs - medical: Not on file  . Transportation needs - non-medical: Not on file  Occupational History  . Not on file  Tobacco Use  . Smoking status: Never Smoker  . Smokeless tobacco: Never Used  Substance and Sexual Activity  . Alcohol use: Yes    Comment: wine with dinner  . Drug use: No  . Sexual activity: Not on file  Other Topics Concern  . Not on file  Social History Narrative   Exercises regularly    Family History  Problem Relation Age of Onset  . Cancer Mother        cancer of saliva glands  . Heart disease Other   . Colon cancer Neg Hx   . Esophageal cancer Neg Hx   . Stomach cancer Neg Hx   . Rectal cancer Neg Hx     Review of Systems  Constitutional: Negative for chills and fever.  HENT: Positive for congestion. Negative for ear pain and sore throat.   Eyes:  Negative for visual disturbance.  Respiratory: Positive for cough. Negative for shortness of breath and wheezing.   Cardiovascular: Negative for chest pain, palpitations and leg swelling.  Gastrointestinal: Positive for constipation (controlled with stool softeners). Negative for abdominal pain, blood in stool, diarrhea and nausea.       No gerd  Genitourinary: Negative for dysuria and hematuria.  Musculoskeletal: Positive for arthralgias (arthritis) and neck pain (arthritis).  Skin: Negative for color change and rash.  Neurological: Negative for dizziness, light-headedness and headaches.  Psychiatric/Behavioral: Positive for sleep disturbance (chronic, gets up twice at night). Negative for dysphoric mood. The patient is not nervous/anxious.        Objective:   Vitals:   04/08/17 1421  BP: 128/70  Pulse: 70  Resp: 16  Temp: 98.3 F (36.8 C)    SpO2: 97%   Filed Weights   04/08/17 1421  Weight: 142 lb (64.4 kg)   Body mass index is 22.24 kg/m.  Wt Readings from Last 3 Encounters:  04/08/17 142 lb (64.4 kg)  03/27/17 142 lb (64.4 kg)  03/02/17 147 lb (66.7 kg)     Physical Exam Constitutional: She appears well-developed and well-nourished. No distress.  HENT:  Head: Normocephalic and atraumatic.  Right Ear: External ear normal. Normal ear canal and TM Left Ear: External ear normal.  Normal ear canal and TM Mouth/Throat: Oropharynx is clear and moist.  Eyes: Conjunctivae and EOM are normal.  Neck: Neck supple. No tracheal deviation present. No thyromegaly present.  No carotid bruit  Cardiovascular: Normal rate, regular rhythm and normal heart sounds.   No murmur heard.  No edema. Pulmonary/Chest: Effort normal and breath sounds normal. No respiratory distress. She has no wheezes. She has no rales.  Breast: deferred to Gyn Abdominal: Soft. She exhibits no distension. There is no tenderness.  Lymphadenopathy: She has no cervical adenopathy.  Skin: Skin is warm and dry. She is not diaphoretic.  Psychiatric: She has a normal mood and affect. Her behavior is normal.        Assessment & Plan:   Physical exam: Screening blood work reviewed Immunizations discussed shingles vaccine, others up-to-date Colonoscopy   up-to-date   Mammogram up-to-date     Gyn    up-to-date  Sees Dr Benjie Karvonen Dexa    up-to-date    Eye exams  Up to date  EKG done this month in the emergency room Exercise  Regular  - [facility]   BMI normal Skin sees dermatology annually   Substance abuse   None  I will prescribe a zpak for her to take with her to [place] - she will only use this if needed.  See Problem List for Assessment and Plan of chronic medical problems.    FU in one year

## 2017-04-07 NOTE — Patient Instructions (Addendum)
We reviewed your blood work.   All other Health Maintenance issues reviewed.   All recommended immunizations and age-appropriate screenings are up-to-date or discussed.  No immunizations administered today.   Medications reviewed and updated.   No changes recommended at this time.  Your prescription(s) have been submitted to your pharmacy. Please take as directed and contact our office if you believe you are having problem(s) with the medication(s).   Please followup in one year   Health Maintenance, Female Adopting a healthy lifestyle and getting preventive care can go a long way to promote health and wellness. Talk with your health care provider about what schedule of regular examinations is right for you. This is a good chance for you to check in with your provider about disease prevention and staying healthy. In between checkups, there are plenty of things you can do on your own. Experts have done a lot of research about which lifestyle changes and preventive measures are most likely to keep you healthy. Ask your health care provider for more information. Weight and diet Eat a healthy diet  Be sure to include plenty of vegetables, fruits, low-fat dairy products, and lean protein.  Do not eat a lot of foods high in solid fats, added sugars, or salt.  Get regular exercise. This is one of the most important things you can do for your health. ? Most adults should exercise for at least 150 minutes each week. The exercise should increase your heart rate and make you sweat (moderate-intensity exercise). ? Most adults should also do strengthening exercises at least twice a week. This is in addition to the moderate-intensity exercise.  Maintain a healthy weight  Body mass index (BMI) is a measurement that can be used to identify possible weight problems. It estimates body fat based on height and weight. Your health care provider can help determine your BMI and help you achieve or maintain a  healthy weight.  For females 20 years of age and older: ? A BMI below 18.5 is considered underweight. ? A BMI of 18.5 to 24.9 is normal. ? A BMI of 25 to 29.9 is considered overweight. ? A BMI of 30 and above is considered obese.  Watch levels of cholesterol and blood lipids  You should start having your blood tested for lipids and cholesterol at 72 years of age, then have this test every 5 years.  You may need to have your cholesterol levels checked more often if: ? Your lipid or cholesterol levels are high. ? You are older than 72 years of age. ? You are at high risk for heart disease.  Cancer screening Lung Cancer  Lung cancer screening is recommended for adults 55-80 years old who are at high risk for lung cancer because of a history of smoking.  A yearly low-dose CT scan of the lungs is recommended for people who: ? Currently smoke. ? Have quit within the past 15 years. ? Have at least a 30-pack-year history of smoking. A pack year is smoking an average of one pack of cigarettes a day for 1 year.  Yearly screening should continue until it has been 15 years since you quit.  Yearly screening should stop if you develop a health problem that would prevent you from having lung cancer treatment.  Breast Cancer  Practice breast self-awareness. This means understanding how your breasts normally appear and feel.  It also means doing regular breast self-exams. Let your health care provider know about any changes, no matter how   small.  If you are in your 20s or 30s, you should have a clinical breast exam (CBE) by a health care provider every 1-3 years as part of a regular health exam.  If you are 40 or older, have a CBE every year. Also consider having a breast X-ray (mammogram) every year.  If you have a family history of breast cancer, talk to your health care provider about genetic screening.  If you are at high risk for breast cancer, talk to your health care provider about  having an MRI and a mammogram every year.  Breast cancer gene (BRCA) assessment is recommended for women who have family members with BRCA-related cancers. BRCA-related cancers include: ? Breast. ? Ovarian. ? Tubal. ? Peritoneal cancers.  Results of the assessment will determine the need for genetic counseling and BRCA1 and BRCA2 testing.  Cervical Cancer Your health care provider may recommend that you be screened regularly for cancer of the pelvic organs (ovaries, uterus, and vagina). This screening involves a pelvic examination, including checking for microscopic changes to the surface of your cervix (Pap test). You may be encouraged to have this screening done every 3 years, beginning at age 21.  For women ages 30-65, health care providers may recommend pelvic exams and Pap testing every 3 years, or they may recommend the Pap and pelvic exam, combined with testing for human papilloma virus (HPV), every 5 years. Some types of HPV increase your risk of cervical cancer. Testing for HPV may also be done on women of any age with unclear Pap test results.  Other health care providers may not recommend any screening for nonpregnant women who are considered low risk for pelvic cancer and who do not have symptoms. Ask your health care provider if a screening pelvic exam is right for you.  If you have had past treatment for cervical cancer or a condition that could lead to cancer, you need Pap tests and screening for cancer for at least 20 years after your treatment. If Pap tests have been discontinued, your risk factors (such as having a new sexual partner) need to be reassessed to determine if screening should resume. Some women have medical problems that increase the chance of getting cervical cancer. In these cases, your health care provider may recommend more frequent screening and Pap tests.  Colorectal Cancer  This type of cancer can be detected and often prevented.  Routine colorectal cancer  screening usually begins at 72 years of age and continues through 72 years of age.  Your health care provider may recommend screening at an earlier age if you have risk factors for colon cancer.  Your health care provider may also recommend using home test kits to check for hidden blood in the stool.  A small camera at the end of a tube can be used to examine your colon directly (sigmoidoscopy or colonoscopy). This is done to check for the earliest forms of colorectal cancer.  Routine screening usually begins at age 50.  Direct examination of the colon should be repeated every 5-10 years through 72 years of age. However, you may need to be screened more often if early forms of precancerous polyps or small growths are found.  Skin Cancer  Check your skin from head to toe regularly.  Tell your health care provider about any new moles or changes in moles, especially if there is a change in a mole's shape or color.  Also tell your health care provider if you have a mole   that is larger than the size of a pencil eraser.  Always use sunscreen. Apply sunscreen liberally and repeatedly throughout the day.  Protect yourself by wearing long sleeves, pants, a wide-brimmed hat, and sunglasses whenever you are outside.  Heart disease, diabetes, and high blood pressure  High blood pressure causes heart disease and increases the risk of stroke. High blood pressure is more likely to develop in: ? People who have blood pressure in the high end of the normal range (130-139/85-89 mm Hg). ? People who are overweight or obese. ? People who are African American.  If you are 18-39 years of age, have your blood pressure checked every 3-5 years. If you are 40 years of age or older, have your blood pressure checked every year. You should have your blood pressure measured twice-once when you are at a hospital or clinic, and once when you are not at a hospital or clinic. Record the average of the two measurements.  To check your blood pressure when you are not at a hospital or clinic, you can use: ? An automated blood pressure machine at a pharmacy. ? A home blood pressure monitor.  If you are between 55 years and 79 years old, ask your health care provider if you should take aspirin to prevent strokes.  Have regular diabetes screenings. This involves taking a blood sample to check your fasting blood sugar level. ? If you are at a normal weight and have a low risk for diabetes, have this test once every three years after 72 years of age. ? If you are overweight and have a high risk for diabetes, consider being tested at a younger age or more often. Preventing infection Hepatitis B  If you have a higher risk for hepatitis B, you should be screened for this virus. You are considered at high risk for hepatitis B if: ? You were born in a country where hepatitis B is common. Ask your health care provider which countries are considered high risk. ? Your parents were born in a high-risk country, and you have not been immunized against hepatitis B (hepatitis B vaccine). ? You have HIV or AIDS. ? You use needles to inject street drugs. ? You live with someone who has hepatitis B. ? You have had sex with someone who has hepatitis B. ? You get hemodialysis treatment. ? You take certain medicines for conditions, including cancer, organ transplantation, and autoimmune conditions.  Hepatitis C  Blood testing is recommended for: ? Everyone born from 1945 through 1965. ? Anyone with known risk factors for hepatitis C.  Sexually transmitted infections (STIs)  You should be screened for sexually transmitted infections (STIs) including gonorrhea and chlamydia if: ? You are sexually active and are younger than 72 years of age. ? You are older than 72 years of age and your health care provider tells you that you are at risk for this type of infection. ? Your sexual activity has changed since you were last screened  and you are at an increased risk for chlamydia or gonorrhea. Ask your health care provider if you are at risk.  If you do not have HIV, but are at risk, it may be recommended that you take a prescription medicine daily to prevent HIV infection. This is called pre-exposure prophylaxis (PrEP). You are considered at risk if: ? You are sexually active and do not regularly use condoms or know the HIV status of your partner(s). ? You take drugs by injection. ? You are   sexually active with a partner who has HIV.  Talk with your health care provider about whether you are at high risk of being infected with HIV. If you choose to begin PrEP, you should first be tested for HIV. You should then be tested every 3 months for as long as you are taking PrEP. Pregnancy  If you are premenopausal and you may become pregnant, ask your health care provider about preconception counseling.  If you may become pregnant, take 400 to 800 micrograms (mcg) of folic acid every day.  If you want to prevent pregnancy, talk to your health care provider about birth control (contraception). Osteoporosis and menopause  Osteoporosis is a disease in which the bones lose minerals and strength with aging. This can result in serious bone fractures. Your risk for osteoporosis can be identified using a bone density scan.  If you are 65 years of age or older, or if you are at risk for osteoporosis and fractures, ask your health care provider if you should be screened.  Ask your health care provider whether you should take a calcium or vitamin D supplement to lower your risk for osteoporosis.  Menopause may have certain physical symptoms and risks.  Hormone replacement therapy may reduce some of these symptoms and risks. Talk to your health care provider about whether hormone replacement therapy is right for you. Follow these instructions at home:  Schedule regular health, dental, and eye exams.  Stay current with your  immunizations.  Do not use any tobacco products including cigarettes, chewing tobacco, or electronic cigarettes.  If you are pregnant, do not drink alcohol.  If you are breastfeeding, limit how much and how often you drink alcohol.  Limit alcohol intake to no more than 1 drink per day for nonpregnant women. One drink equals 12 ounces of beer, 5 ounces of wine, or 1 ounces of hard liquor.  Do not use street drugs.  Do not share needles.  Ask your health care provider for help if you need support or information about quitting drugs.  Tell your health care provider if you often feel depressed.  Tell your health care provider if you have ever been abused or do not feel safe at home. This information is not intended to replace advice given to you by your health care provider. Make sure you discuss any questions you have with your health care provider. Document Released: 09/21/2010 Document Revised: 08/14/2015 Document Reviewed: 12/10/2014 Elsevier Interactive Patient Education  2018 [employer]  

## 2017-04-08 ENCOUNTER — Ambulatory Visit (INDEPENDENT_AMBULATORY_CARE_PROVIDER_SITE_OTHER): Payer: Medicare HMO | Admitting: Internal Medicine

## 2017-04-08 VITALS — BP 128/70 | HR 70 | Temp 98.3°F | Resp 16 | Ht 67.0 in | Wt 142.0 lb

## 2017-04-08 DIAGNOSIS — Z0000 Encounter for general adult medical examination without abnormal findings: Secondary | ICD-10-CM | POA: Diagnosis not present

## 2017-04-08 DIAGNOSIS — M810 Age-related osteoporosis without current pathological fracture: Secondary | ICD-10-CM

## 2017-04-08 DIAGNOSIS — I10 Essential (primary) hypertension: Secondary | ICD-10-CM

## 2017-04-08 MED ORDER — LISINOPRIL-HYDROCHLOROTHIAZIDE 20-25 MG PO TABS: 1.0000 | ORAL_TABLET | Freq: Every day | ORAL | 3 refills | Status: DC

## 2017-04-08 MED ORDER — DILTIAZEM HCL ER 240 MG PO CP24: 240.0000 mg | ORAL_CAPSULE | Freq: Every day | ORAL | 3 refills | Status: DC

## 2017-04-08 MED ORDER — AZITHROMYCIN 250 MG PO TABS: ORAL_TABLET | ORAL | 0 refills | Status: DC

## 2017-04-09 ENCOUNTER — Encounter: Payer: Self-pay | Admitting: Internal Medicine

## 2017-04-09 NOTE — Assessment & Plan Note (Signed)
BP well controlled Current regimen effective and well tolerated Continue current medications at current doses Cmp, htn, cbc

## 2017-04-09 NOTE — Assessment & Plan Note (Signed)
dexa up to date Exercising regularly Taking cal/vitamin d

## 2017-05-25 ENCOUNTER — Encounter: Payer: Self-pay | Admitting: Internal Medicine

## 2017-05-25 ENCOUNTER — Ambulatory Visit (INDEPENDENT_AMBULATORY_CARE_PROVIDER_SITE_OTHER): Payer: Medicare HMO | Admitting: Internal Medicine

## 2017-05-25 VITALS — BP 122/60 | HR 94 | Temp 98.1°F | Resp 16 | Wt 145.0 lb

## 2017-05-25 DIAGNOSIS — M542 Cervicalgia: Secondary | ICD-10-CM | POA: Diagnosis not present

## 2017-05-25 MED ORDER — MELOXICAM 7.5 MG PO TABS: 7.5000 mg | ORAL_TABLET | Freq: Every day | ORAL | 0 refills | Status: DC

## 2017-05-25 NOTE — Progress Notes (Signed)
Subjective:    Patient ID: Sherry Mitchell, female    DOB: [birth year], 72 y.o.   MRN: [id number]  HPI The patient is here for an acute visit.   Neck pain:  She has neck and shoulder arthritis.  It started a few weeks ago.  She initially thought she tweeked the neck when it first happened but it is not getting better.  It feels like a pulling sensation in the right side of her posterior neck/head behind her ear.   She denies any numbness/tingling and headaches.  The discomfort or pain does radiate up her head.  There is no other radiation into the shoulders or upper back.  If she has her head looking straight ahead she does not feel any discomfort or pulling sensation.  She only has that sensation when she looks to the right.  She does tense up her neck and upper back while sleeping, but this is not new and she reminds herself to relax.  She was concerned that the pain was not going away and became very anxious about it, which is why she is here today.  Her pain level is 3/10.  She has taken Advil approximately 4 times since this has started and it does help, but she did not want to take it on a regular basis.     Medications and allergies reviewed with patient and updated if appropriate.  Patient Active Problem List   Diagnosis Date Noted  . Osteoporosis 04/24/2016  . Osteoarthritis   . Hypertension   . Colon polyps     Current Outpatient Medications on File Prior to Visit  Medication Sig Dispense Refill  . Calcium Carbonate-Vitamin D (CALCIUM-D PO) Take 1 tablet by mouth.    . diltiazem (DILT-XR) 240 MG 24 hr capsule Take 1 capsule (240 mg total) by mouth daily. 90 capsule 3  . lisinopril-hydrochlorothiazide (PRINZIDE,ZESTORETIC) 20-25 MG tablet Take 1 tablet by mouth daily. 90 tablet 3   No current facility-administered medications on file prior to visit.     Past Medical History:  Diagnosis Date  . Colon polyps 02/2003, 02/2009   Adenomatous polyps  . Hypertension   .  Osteoarthritis    hands  . Osteopenia    DEXA 02/2011, 02/2013: -1.9    Past Surgical History:  Procedure Laterality Date  . BREAST ENHANCEMENT SURGERY  1987  . COLONOSCOPY      Social History   Socioeconomic History  . Marital status: Married    Spouse name: None  . Number of children: None  . Years of education: None  . Highest education level: None  Social Needs  . Financial resource strain: None  . Food insecurity - worry: None  . Food insecurity - inability: None  . Transportation needs - medical: None  . Transportation needs - non-medical: None  Occupational History  . None  Tobacco Use  . Smoking status: Never Smoker  . Smokeless tobacco: Never Used  Substance and Sexual Activity  . Alcohol use: Yes    Comment: wine with dinner  . Drug use: No  . Sexual activity: None  Other Topics Concern  . None  Social History Narrative   Exercises regularly    Family History  Problem Relation Age of Onset  . Cancer Mother        cancer of saliva glands  . Heart disease Other   . Colon cancer Neg Hx   . Esophageal cancer Neg Hx   . Stomach cancer Neg  Hx   . Rectal cancer Neg Hx     Review of Systems  Musculoskeletal: Positive for neck pain and neck stiffness.  Skin: Negative for color change and rash.  Neurological: Negative for dizziness, weakness, light-headedness, numbness and headaches.       Objective:   Vitals:   05/25/17 1514  BP: 122/60  Pulse: 94  Resp: 16  Temp: 98.1 F (36.7 C)  SpO2: 99%   Wt Readings from Last 3 Encounters:  05/25/17 145 lb (65.8 kg)  04/08/17 142 lb (64.4 kg)  03/27/17 142 lb (64.4 kg)   Body mass index is 22.71 kg/m.   Physical Exam  Constitutional: She appears well-developed and well-nourished. No distress.  HENT:  Head: Normocephalic and atraumatic.  Musculoskeletal:  No tenderness with palpation right posterior head or neck, area of concern is at the base of skull and behind the ear.  Discomfort increases  with looking toward the right, but none when looking straight ahead  Neurological: No cranial nerve deficit.  Skin: Skin is warm and dry. She is not diaphoretic. No erythema.           Assessment & Plan:    See Problem List for Assessment and Plan of chronic medical problems.

## 2017-05-25 NOTE — Assessment & Plan Note (Signed)
Exam fairly normal Likely muscular in nature, but she is very anxious about potential causes Advil has helped at home, but she has not taken it consistently Start meloxicam 7.5 mg daily with food for 2 weeks, possibly longer Will refer to Dr. Tamala Julian, sports medicine for further evaluation and treatment

## 2017-06-08 NOTE — Progress Notes (Signed)
[facility] [address] [place], [place] [address] Phone: [phone] Subjective:    I'm seeing this patient by the request  of: Binnie Rail, MD    CC: Neck pain  QPR:[id number]  Sherry Mitchell is a 72 y.o. female coming in with complaint of neck pain.  Been going on for weeks.   Location- Behind the ear Duration- periodically Character- tight, pulling, dull Aggravating factors- rotation Reliving factors-  Therapies tried- Oral meds. Severity-6 out of 10 but seems to be worsening.   Patient previously did not seen primary care provider and was given meloxicam.  Patient does have an MRI of the right shoulder from January 2016.  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  Past Medical History:  Diagnosis Date  . Colon polyps 02/2003, 02/2009   Adenomatous polyps  . Hypertension   . Osteoarthritis    hands  . Osteopenia    DEXA 02/2011, 02/2013: -1.9   Past Surgical History:  Procedure Laterality Date  . BREAST ENHANCEMENT SURGERY  1987  . COLONOSCOPY     Social History   Socioeconomic History  . Marital status: Married    Spouse name: Not on file  . Number of children: Not on file  . Years of education: Not on file  . Highest education level: Not on file  Occupational History  . Not on file  Social Needs  . Financial resource strain: Not on file  . Food insecurity:    Worry: Not on file    Inability: Not on file  . Transportation needs:    Medical: Not on file    Non-medical: Not on file  Tobacco Use  . Smoking status: Never Smoker  . Smokeless tobacco: Never Used  Substance and Sexual Activity  . Alcohol use: Yes    Comment: wine with dinner  . Drug use: No  . Sexual activity: Not on file  Lifestyle  . Physical activity:    Days per week: Not on file    Minutes per session: Not on file  . Stress: Not on file    Relationships  . Social connections:    Talks on phone: Not on file    Gets together: Not on file    Attends religious service: Not on file    Active member of club or organization: Not on file    Attends meetings of clubs or organizations: Not on file    Relationship status: Not on file  Other Topics Concern  . Not on file  Social History Narrative   Exercises regularly   No Known Allergies Family History  Problem Relation Age of Onset  . Cancer Mother        cancer of saliva glands  . Heart disease Other   . Colon cancer Neg Hx   . Esophageal cancer Neg Hx   . Stomach cancer Neg Hx   . Rectal cancer Neg Hx      Past medical history, social, surgical and family history all reviewed in electronic medical record.  No pertanent information unless stated regarding to the chief complaint.   Review of Systems:Review of systems updated and as accurate as of 06/09/17  No headache, visual changes, nausea, vomiting, diarrhea, constipation, dizziness, abdominal pain, skin rash, fevers, chills, night sweats, weight loss, swollen lymph nodes, body aches, joint swelling, , chest pain, shortness of breath, mood  changes.  Positive muscle aches  Objective  Blood pressure (!) 146/80, pulse 62, height 5\' 7"  (1.702 m), weight 147 lb (66.7 kg), SpO2 98 %. Systems examined below as of 06/09/17   General: No apparent distress alert and oriented x3 mood and affect normal, dressed appropriately.  HEENT: Pupils equal, extraocular movements intact  Respiratory: Patient's speak in full sentences and does not appear short of breath  Cardiovascular: No lower extremity edema, non tender, no erythema  Skin: Warm dry intact with no signs of infection or rash on extremities or on axial skeleton.  Abdomen: Soft nontender  Neuro: Cranial nerves II through XII are intact, neurovascularly intact in all extremities with 2+ DTRs and 2+ pulses.  Lymph: No lymphadenopathy of posterior or anterior cervical chain  or axillae bilaterally.  Gait normal with good balance and coordination.  MSK:  Non tender with full range of motion and good stability and symmetric strength and tone of shoulders, elbows, wrist, hip, knee and ankles bilaterally.  Moderate arthritic changes of multiple joints Neck: Inspection loss of lordosis. No palpable stepoffs. Negative Spurling's maneuver. Limitation with right-sided side bending and left-sided rotation Grip strength and sensation normal in bilateral hands Strength good C4 to T1 distribution No sensory change to C4 to T1 Negative Hoffman sign bilaterally Reflexes normal Tenderness over the right sternocleidomastoid as well as the paraspinal musculature on the right side of the neck  [id number]; 15 additional minutes spent for Therapeutic exercises as stated in above notes.  This included exercises focusing on stretching, strengthening, with significant focus on eccentric aspects.   Long term goals include an improvement in range of motion, strength, endurance as well as avoiding reinjury. Patient's frequency would include in 1-2 times a day, 3-5 times a week for a duration of 6-12 weeks. Exercises that included:  Basic scapular stabilization to include adduction and depression of scapula Scaption, focusing on proper movement and good control Internal and External rotation utilizing a theraband, with elbow tucked at side entire time Rows with theraband  Which was given    Proper technique shown and discussed handout in great detail with ATC.  All questions were discussed and answered.      Impression and Recommendations:     This case required medical decision making of moderate complexity.      Note: This dictation was prepared with Dragon dictation along with smaller phrase technology. Any transcriptional errors that result from this process are unintentional.

## 2017-06-09 ENCOUNTER — Ambulatory Visit: Payer: Medicare HMO | Admitting: Family Medicine

## 2017-06-09 ENCOUNTER — Encounter: Payer: Self-pay | Admitting: Family Medicine

## 2017-06-09 DIAGNOSIS — M542 Cervicalgia: Secondary | ICD-10-CM | POA: Diagnosis not present

## 2017-06-09 MED ORDER — GABAPENTIN 100 MG PO CAPS: 200.0000 mg | ORAL_CAPSULE | Freq: Every day | ORAL | 3 refills | Status: DC

## 2017-06-09 NOTE — Patient Instructions (Addendum)
Great to see you  Ice 20 minutes 2 times daily. Usually after activity and before bed. Exercises 3 times a week.  pennsaid pinkie amount topically 2 times daily as needed.  Gabapentin 200mg  at night  Over the counter Tart cherry extract any dose at night Vitamin D 2000 IU daily  See me again in 4 weeks and we will make sure you are doing great

## 2017-06-09 NOTE — Assessment & Plan Note (Signed)
Chronic neck pain.  Actually seems to be more muscular than truly osteoarthritic at this time.  Home exercises given, topical anti-inflammatories, low-dose gabapentin.  Encourage patient to use the anti-inflammatory more in burst formation.  Discussed posture and ergonomics.  Follow-up again in 4 weeks

## 2017-07-07 ENCOUNTER — Encounter: Payer: Self-pay | Admitting: Family Medicine

## 2017-07-07 ENCOUNTER — Ambulatory Visit: Payer: Medicare HMO | Admitting: Family Medicine

## 2017-07-07 DIAGNOSIS — M542 Cervicalgia: Secondary | ICD-10-CM | POA: Diagnosis not present

## 2017-07-07 NOTE — Assessment & Plan Note (Signed)
Significant improvement with conservative therapy.  Has gabapentin that she can continue to take at night.  Meloxicam as needed.  Discussed icing regimen and home exercises.  Declined formal physical therapy.  Follow-up 2 months

## 2017-07-07 NOTE — Patient Instructions (Signed)
Good to see you  Ice is your friend Consider adding in the exercises  Gabapentin 0-3 pills at night as needed Meloxicam daily for 3 days when in trouble  See me again in 2 months if still in pain

## 2017-07-07 NOTE — Progress Notes (Signed)
[facility] [address], [place] [address] Phone: [phone] Subjective:    I'm seeing this patient by the request  of:    CC: Neck pain follow-up  [id number]  Sherry Mitchell is a 72 y.o. female coming in with complaint of neck pain.  Patient's found to have also arthritic changes and seemed to have tightness of the trapezius.  Patient had no radicular symptoms.  Patient states that she is 85-90% better at this time.  No radicular symptoms still.  States that there is days where she can go to without any pain at all     Past Medical History:  Diagnosis Date  . Colon polyps 02/2003, 02/2009   Adenomatous polyps  . Hypertension   . Osteoarthritis    hands  . Osteopenia    DEXA 02/2011, 02/2013: -1.9   Past Surgical History:  Procedure Laterality Date  . BREAST ENHANCEMENT SURGERY  1987  . COLONOSCOPY     Social History   Socioeconomic History  . Marital status: Married    Spouse name: Not on file  . Number of children: Not on file  . Years of education: Not on file  . Highest education level: Not on file  Occupational History  . Not on file  Social Needs  . Financial resource strain: Not on file  . Food insecurity:    Worry: Not on file    Inability: Not on file  . Transportation needs:    Medical: Not on file    Non-medical: Not on file  Tobacco Use  . Smoking status: Never Smoker  . Smokeless tobacco: Never Used  Substance and Sexual Activity  . Alcohol use: Yes    Comment: wine with dinner  . Drug use: No  . Sexual activity: Not on file  Lifestyle  . Physical activity:    Days per week: Not on file    Minutes per session: Not on file  . Stress: Not on file  Relationships  . Social connections:    Talks on phone: Not on file    Gets together: Not on file    Attends religious service: Not on file    Active member of club or organization: Not on file    Attends meetings of clubs or organizations: Not on file     Relationship status: Not on file  Other Topics Concern  . Not on file  Social History Narrative   Exercises regularly   No Known Allergies Family History  Problem Relation Age of Onset  . Cancer Mother        cancer of saliva glands  . Heart disease Other   . Colon cancer Neg Hx   . Esophageal cancer Neg Hx   . Stomach cancer Neg Hx   . Rectal cancer Neg Hx      Past medical history, social, surgical and family history all reviewed in electronic medical record.  No pertanent information unless stated regarding to the chief complaint.   Review of Systems:Review of systems updated and as accurate as of 07/07/17  No headache, visual changes, nausea, vomiting, diarrhea, constipation, dizziness, abdominal pain, skin rash, fevers, chills, night sweats, weight loss, swollen lymph nodes, body aches, joint swelling, muscle aches, chest pain, shortness of breath, mood changes.  Positive muscle aches  Objective  Blood pressure 138/82, pulse 60, height 5\' 7"  (1.702 m), weight 148 lb (67.1 kg), SpO2 98 %. Systems examined below as of 07/07/17  General: No apparent distress alert and oriented x3 mood and affect normal, dressed appropriately.  HEENT: Pupils equal, extraocular movements intact  Respiratory: Patient's speak in full sentences and does not appear short of breath  Cardiovascular: No lower extremity edema, non tender, no erythema  Skin: Warm dry intact with no signs of infection or rash on extremities or on axial skeleton.  Abdomen: Soft nontender  Neuro: Cranial nerves II through XII are intact, neurovascularly intact in all extremities with 2+ DTRs and 2+ pulses.  Lymph: No lymphadenopathy of posterior or anterior cervical chain or axillae bilaterally.  Gait normal with good balance and coordination.  MSK:  Non tender with full range of motion and good stability and symmetric strength and tone of shoulders, elbows, wrist, hip, knee and ankles bilaterally.  Arthritic changes of  multiple joints Neck exam does show some mild limited range of motion in sidebending and rotation bilaterally right greater than left.  Mild crepitus noted.  Negative Spurling's test.  Neurovascularly intact.   Impression and Recommendations:     This case required medical decision making of moderate complexity.      Note: This dictation was prepared with Dragon dictation along with smaller phrase technology. Any transcriptional errors that result from this process are unintentional.

## 2017-07-07 NOTE — Progress Notes (Signed)
[facility] [address], [place] [address] Phone: [phone] Subjective:    I'm seeing this patient by the request  of:    CC:   [id number]  Sherry Mitchell is a 72 y.o. female coming in with complaint of neck pain. She states that within the last week she has had some relief and feels much more comfortable.   Onset-  Location Duration-  Character- Aggravating factors- Reliving factors-  Therapies tried-  Severity-     Past Medical History:  Diagnosis Date  . Colon polyps 02/2003, 02/2009   Adenomatous polyps  . Hypertension   . Osteoarthritis    hands  . Osteopenia    DEXA 02/2011, 02/2013: -1.9   Past Surgical History:  Procedure Laterality Date  . BREAST ENHANCEMENT SURGERY  1987  . COLONOSCOPY     Social History   Socioeconomic History  . Marital status: Married    Spouse name: Not on file  . Number of children: Not on file  . Years of education: Not on file  . Highest education level: Not on file  Occupational History  . Not on file  Social Needs  . Financial resource strain: Not on file  . Food insecurity:    Worry: Not on file    Inability: Not on file  . Transportation needs:    Medical: Not on file    Non-medical: Not on file  Tobacco Use  . Smoking status: Never Smoker  . Smokeless tobacco: Never Used  Substance and Sexual Activity  . Alcohol use: Yes    Comment: wine with dinner  . Drug use: No  . Sexual activity: Not on file  Lifestyle  . Physical activity:    Days per week: Not on file    Minutes per session: Not on file  . Stress: Not on file  Relationships  . Social connections:    Talks on phone: Not on file    Gets together: Not on file    Attends religious service: Not on file    Active member of club or organization: Not on file    Attends meetings of clubs or organizations: Not on file    Relationship status: Not on file  Other Topics Concern  . Not on file  Social History  Narrative   Exercises regularly   No Known Allergies Family History  Problem Relation Age of Onset  . Cancer Mother        cancer of saliva glands  . Heart disease Other   . Colon cancer Neg Hx   . Esophageal cancer Neg Hx   . Stomach cancer Neg Hx   . Rectal cancer Neg Hx      Past medical history, social, surgical and family history all reviewed in electronic medical record.  No pertanent information unless stated regarding to the chief complaint.   Review of Systems:Review of systems updated and as accurate as of 07/07/17  No headache, visual changes, nausea, vomiting, diarrhea, constipation, dizziness, abdominal pain, skin rash, fevers, chills, night sweats, weight loss, swollen lymph nodes, body aches, joint swelling, muscle aches, chest pain, shortness of breath, mood changes.   Objective  There were no vitals taken for this visit. Systems examined below as of 07/07/17   General: No apparent distress alert and oriented x3 mood and affect normal, dressed appropriately.  HEENT: Pupils equal, extraocular movements intact  Respiratory: Patient's speak in full sentences and does not appear short of breath  Cardiovascular: No lower extremity edema, non tender, no erythema  Skin: Warm dry intact with no signs of infection or rash on extremities or on axial skeleton.  Abdomen: Soft nontender  Neuro: Cranial nerves II through XII are intact, neurovascularly intact in all extremities with 2+ DTRs and 2+ pulses.  Lymph: No lymphadenopathy of posterior or anterior cervical chain or axillae bilaterally.  Gait normal with good balance and coordination.  MSK:  Non tender with full range of motion and good stability and symmetric strength and tone of shoulders, elbows, wrist, hip, knee and ankles bilaterally.     Impression and Recommendations:     This case required medical decision making of moderate complexity.      Note: This dictation was prepared with Dragon dictation along  with smaller phrase technology. Any transcriptional errors that result from this process are unintentional.

## 2017-07-14 DIAGNOSIS — L853 Xerosis cutis: Secondary | ICD-10-CM | POA: Diagnosis not present

## 2017-07-14 DIAGNOSIS — Z85828 Personal history of other malignant neoplasm of skin: Secondary | ICD-10-CM | POA: Diagnosis not present

## 2017-07-14 DIAGNOSIS — L814 Other melanin hyperpigmentation: Secondary | ICD-10-CM | POA: Diagnosis not present

## 2017-07-14 DIAGNOSIS — L918 Other hypertrophic disorders of the skin: Secondary | ICD-10-CM | POA: Diagnosis not present

## 2017-07-14 DIAGNOSIS — S20361A Insect bite (nonvenomous) of right front wall of thorax, initial encounter: Secondary | ICD-10-CM | POA: Diagnosis not present

## 2017-07-14 DIAGNOSIS — D2261 Melanocytic nevi of right upper limb, including shoulder: Secondary | ICD-10-CM | POA: Diagnosis not present

## 2017-07-14 DIAGNOSIS — L738 Other specified follicular disorders: Secondary | ICD-10-CM | POA: Diagnosis not present

## 2017-07-14 DIAGNOSIS — D485 Neoplasm of uncertain behavior of skin: Secondary | ICD-10-CM | POA: Diagnosis not present

## 2017-07-14 DIAGNOSIS — L821 Other seborrheic keratosis: Secondary | ICD-10-CM | POA: Diagnosis not present

## 2017-09-07 ENCOUNTER — Ambulatory Visit: Payer: Medicare HMO | Admitting: Family Medicine

## 2017-10-05 DIAGNOSIS — R69 Illness, unspecified: Secondary | ICD-10-CM | POA: Diagnosis not present

## 2017-11-23 NOTE — Progress Notes (Addendum)
Subjective:   Sherry Mitchell is a 72 y.o. female who presents for Medicare Annual (Subsequent) preventive examination.  Review of Systems:  No ROS.  Medicare Wellness Visit. Additional risk factors are reflected in the social history.  Cardiac Risk Factors include: advanced age (>79men, >66 women);hypertension Sleep patterns: feels rested on waking, gets up 1 times nightly to void and sleeps 7 hours nightly.    Home Safety/Smoke Alarms: Feels safe in home. Smoke alarms in place.  Living environment; residence and Firearm Safety: 1-story house/ trailer, no firearms Lives with husband, no needs for DME, good support system Seat Belt Safety/Bike Helmet: Wears seat belt.      Objective:     Vitals: BP 128/74   Pulse 72   Resp 18   Ht 5\' 7"  (1.702 m)   Wt 152 lb (68.9 kg)   SpO2 100%   BMI 23.81 kg/m   Body mass index is 23.81 kg/m.  Advanced Directives 11/24/2017 11/19/2016 05/08/2014 03/10/2014 02/19/2014  Does Patient Have a Medical Advance Directive? Yes Yes Yes No Yes  Type of Paramedic of Accident;Living will [place];Living will - - [place]  Does patient want to make changes to medical advance directive? - - Yes - information given - -  Copy of [place] in Chart? No - copy requested No - copy requested No - copy requested - -    Tobacco Social History   Tobacco Use  Smoking Status Never Smoker  Smokeless Tobacco Never Used     Counseling given: Not Answered  Past Medical History:  Diagnosis Date  . Colon polyps 02/2003, 02/2009   Adenomatous polyps  . Hypertension   . Osteoarthritis    hands  . Osteopenia    DEXA 02/2011, 02/2013: -1.9   Past Surgical History:  Procedure Laterality Date  . BREAST ENHANCEMENT SURGERY  1987  . COLONOSCOPY     Family History  Problem Relation Age of Onset  . Cancer Mother        cancer of saliva glands  . Heart disease Other   . Colon  cancer Neg Hx   . Esophageal cancer Neg Hx   . Stomach cancer Neg Hx   . Rectal cancer Neg Hx    Social History   Socioeconomic History  . Marital status: Married    Spouse name: Not on file  . Number of children: 2  . Years of education: Not on file  . Highest education level: Not on file  Occupational History  . Not on file  Social Needs  . Financial resource strain: Not hard at all  . Food insecurity:    Worry: Never true    Inability: Never true  . Transportation needs:    Medical: No    Non-medical: No  Tobacco Use  . Smoking status: Never Smoker  . Smokeless tobacco: Never Used  Substance and Sexual Activity  . Alcohol use: Yes    Comment: wine with dinner  . Drug use: No  . Sexual activity: Not Currently  Lifestyle  . Physical activity:    Days per week: 4 days    Minutes per session: 50 min  . Stress: Not at all  Relationships  . Social connections:    Talks on phone: More than three times a week    Gets together: More than three times a week    Attends religious service: Never    Active member of  club or organization: Yes    Attends meetings of clubs or organizations: More than 4 times per year    Relationship status: Married  Other Topics Concern  . Not on file  Social History Narrative   Exercises regularly    Outpatient Encounter Medications as of 11/24/2017  Medication Sig  . Calcium Carbonate-Vitamin D (CALCIUM-D PO) Take 1 tablet by mouth.  . diltiazem (DILT-XR) 240 MG 24 hr capsule Take 1 capsule (240 mg total) by mouth daily.  [name] lisinopril-hydrochlorothiazide (PRINZIDE,ZESTORETIC) 20-25 MG tablet Take 1 tablet by mouth daily.  . [DISCONTINUED] gabapentin (NEURONTIN) 100 MG capsule Take 2 capsules (200 mg total) by mouth at bedtime. (Patient not taking: Reported on 11/24/2017)  . [DISCONTINUED] raloxifene (EVISTA) 60 MG tablet    No facility-administered encounter medications on file as of 11/24/2017.     Activities of Daily Living In your  present state of health, do you have any difficulty performing the following activities: 11/24/2017  Hearing? N  Vision? N  Difficulty concentrating or making decisions? N  Walking or climbing stairs? N  Dressing or bathing? N  Doing errands, shopping? N  Preparing Food and eating ? N  Using the Toilet? N  In the past six months, have you accidently leaked urine? N  Do you have problems with loss of bowel control? N  Managing your Medications? N  Managing your Finances? N  Housekeeping or managing your Housekeeping? N  Some recent data might be hidden    Patient Care Team: Binnie Rail, MD as PCP - General (Internal Medicine) Ladene Artist, MD (Gastroenterology) Azucena Fallen, MD (Obstetrics and Gynecology) Almedia Balls, MD (Orthopedic Surgery)    Assessment:   This is a routine wellness examination for Sherry Mitchell. Physical assessment deferred to PCP.   Exercise Activities and Dietary recommendations Current Exercise Habits: Home exercise routine;Structured exercise class, Type of exercise: walking(stationary bike), Time (Minutes): 50, Frequency (Times/Week): 4, Weekly Exercise (Minutes/Week): 200, Intensity: Mild, Exercise limited by: None identified  Diet (meal preparation, eat out, water intake, caffeinated beverages, dairy products, fruits and vegetables): in general, a "healthy" diet  , well balanced eats a variety of fruits and vegetables daily, limits salt, fat/cholesterol, sugar,carbohydrates,caffeine, drinks 6-8 glasses of water daily.  Goals    . Patient Stated     Stay physically and socially active. Continue to eat healthy and enjoy life.    . Take time for myself to be as healthy as possible     Read books, exercise, enjoy life, and family. Continue to do a lot with my grandchildren.       Fall Risk Fall Risk  11/19/2016 04/05/2016 03/04/2014 03/01/2013 02/28/2012  Falls in the past year? No No No No No    Depression Screen PHQ 2/9 Scores 11/24/2017 11/19/2016  04/05/2016 03/04/2014  PHQ - 2 Score 0 0 0 0  PHQ- 9 Score - 3 - -     Cognitive Function       Ad8 score reviewed for issues:  Issues making decisions: no  Less interest in hobbies / activities: no  Repeats questions, stories (family complaining): no  Trouble using ordinary gadgets (microwave, computer, phone):no  Forgets the month or year: no  Mismanaging finances: no  Remembering appts: no  Daily problems with thinking and/or memory: no Ad8 score is= 0  Immunization History  Administered Date(s) Administered  . Influenza Split 02/26/2011, 11/29/2011, 12/07/2013  . Influenza, High Dose Seasonal PF 11/28/2012, 11/19/2016  . Influenza-Unspecified 01/03/2015, 12/26/2015  .  Pneumococcal Conjugate-13 03/04/2014  . Pneumococcal Polysaccharide-23 03/06/2015  . Tdap 02/26/2011   Screening Tests Health Maintenance  Topic Date Due  . INFLUENZA VACCINE  10/20/2017  . DEXA SCAN  04/14/2018  . MAMMOGRAM  02/22/2019  . COLONOSCOPY  03/06/2019  . TETANUS/TDAP  02/25/2021  . Hepatitis C Screening  Completed  . PNA vac Low Risk Adult  Completed      Plan:   Continue doing brain stimulating activities (puzzles, reading, adult coloring books, staying active) to keep memory sharp.   Continue to eat heart healthy diet (full of fruits, vegetables, whole grains, lean protein, water--limit salt, fat, and sugar intake) and increase physical activity as tolerated.  I have personally reviewed and noted the following in the patient's chart:   . Medical and social history . Use of alcohol, tobacco or illicit drugs  . Current medications and supplements . Functional ability and status . Nutritional status . Physical activity . Advanced directives . List of other physicians . Vitals . Screenings to include cognitive, depression, and falls . Referrals and appointments  In addition, I have reviewed and discussed with patient certain preventive protocols, quality metrics, and best  practice recommendations. A written personalized care plan for preventive services as well as general preventive health recommendations were provided to patient.     Michiel Cowboy, RN  11/24/2017    Medical screening examination/treatment/procedure(s) were performed by non-physician practitioner and as supervising physician I was immediately available for consultation/collaboration. I agree with above. Binnie Rail, MD

## 2017-11-24 ENCOUNTER — Ambulatory Visit (INDEPENDENT_AMBULATORY_CARE_PROVIDER_SITE_OTHER): Payer: Medicare HMO | Admitting: *Deleted

## 2017-11-24 VITALS — BP 128/74 | HR 72 | Resp 18 | Ht 67.0 in | Wt 152.0 lb

## 2017-11-24 DIAGNOSIS — Z23 Encounter for immunization: Secondary | ICD-10-CM | POA: Diagnosis not present

## 2017-11-24 DIAGNOSIS — Z0000 Encounter for general adult medical examination without abnormal findings: Secondary | ICD-10-CM

## 2017-11-24 NOTE — Patient Instructions (Addendum)
Continue doing brain stimulating activities (puzzles, reading, adult coloring books, staying active) to keep memory sharp.   Continue to eat heart healthy diet (full of fruits, vegetables, whole grains, lean protein, water--limit salt, fat, and sugar intake) and increase physical activity as tolerated.  Sherry Mitchell , Thank you for taking time to come for your Medicare Wellness Visit. I appreciate your ongoing commitment to your health goals. Please review the following plan we discussed and let me know if I can assist you in the future.   These are the goals we discussed: Goals    . Patient Stated     Stay physically and socially active. Continue to eat healthy and enjoy life.    . Take time for myself to be as healthy as possible     Read books, exercise, enjoy life, and family. Continue to do a lot with my grandchildren.       This is a list of the screening recommended for you and due dates:  Health Maintenance  Topic Date Due  . Flu Shot  10/20/2017  . DEXA scan (bone density measurement)  04/14/2018  . Mammogram  02/22/2019  . Colon Cancer Screening  03/06/2019  . Tetanus Vaccine  02/25/2021  .  Hepatitis C: One time screening is recommended by Center for Disease Control  (CDC) for  adults born from [birth year] through 1965.   Completed  . Pneumonia vaccines  Completed   Influenza Virus Vaccine injection What is this medicine? INFLUENZA VIRUS VACCINE (in floo EN zuh VAHY ruhs vak SEEN) helps to reduce the risk of getting influenza also known as the flu. The vaccine only helps protect you against some strains of the flu. This medicine may be used for other purposes; ask your health care provider or pharmacist if you have questions. COMMON BRAND NAME(S): Afluria, Agriflu, Alfuria, FLUAD, Fluarix, Fluarix Quadrivalent, Flublok, Flublok Quadrivalent, FLUCELVAX, Flulaval, Fluvirin, Fluzone, Fluzone High-Dose, Fluzone Intradermal What should I tell my health care provider before I take this  medicine? They need to know if you have any of these conditions: -bleeding disorder like hemophilia -fever or infection -Guillain-Barre syndrome or other neurological problems -immune system problems -infection with the human immunodeficiency virus (HIV) or AIDS -low blood platelet counts -multiple sclerosis -an unusual or allergic reaction to influenza virus vaccine, latex, other medicines, foods, dyes, or preservatives. Different brands of vaccines contain different allergens. Some may contain latex or eggs. Talk to your doctor about your allergies to make sure that you get the right vaccine. -pregnant or trying to get pregnant -breast-feeding How should I use this medicine? This vaccine is for injection into a muscle or under the skin. It is given by a health care professional. A copy of Vaccine Information Statements will be given before each vaccination. Read this sheet carefully each time. The sheet may change frequently. Talk to your healthcare provider to see which vaccines are right for you. Some vaccines should not be used in all age groups. Overdosage: If you think you have taken too much of this medicine contact a poison control center or emergency room at once. NOTE: This medicine is only for you. Do not share this medicine with others. What if I miss a dose? This does not apply. What may interact with this medicine? -chemotherapy or radiation therapy -medicines that lower your immune system like etanercept, anakinra, infliximab, and adalimumab -medicines that treat or prevent blood clots like warfarin -phenytoin -steroid medicines like prednisone or cortisone -theophylline -vaccines This list may  not describe all possible interactions. Give your health care provider a list of all the medicines, herbs, non-prescription drugs, or dietary supplements you use. Also tell them if you smoke, drink alcohol, or use illegal drugs. Some items may interact with your medicine. What  should I watch for while using this medicine? Report any side effects that do not go away within 3 days to your doctor or health care professional. Call your health care provider if any unusual symptoms occur within 6 weeks of receiving this vaccine. You may still catch the flu, but the illness is not usually as bad. You cannot get the flu from the vaccine. The vaccine will not protect against colds or other illnesses that may cause fever. The vaccine is needed every year. What side effects may I notice from receiving this medicine? Side effects that you should report to your doctor or health care professional as soon as possible: -allergic reactions like skin rash, itching or hives, swelling of the face, lips, or tongue Side effects that usually do not require medical attention (report to your doctor or health care professional if they continue or are bothersome): -fever -headache -muscle aches and pains -pain, tenderness, redness, or swelling at the injection site -tiredness This list may not describe all possible side effects. Call your doctor for medical advice about side effects. You may report side effects to FDA at 1-800-FDA-1088. Where should I keep my medicine? The vaccine will be given by a health care professional in a clinic, pharmacy, doctor's office, or other health care setting. You will not be given vaccine doses to store at home. NOTE: This sheet is a summary. It may not cover all possible information. If you have questions about this medicine, talk to your doctor, pharmacist, or health care provider.  2018 Elsevier/Gold Standard (2014-09-27 10:07:28) Health Maintenance, Female Adopting a healthy lifestyle and getting preventive care can go a long way to promote health and wellness. Talk with your health care provider about what schedule of regular examinations is right for you. This is a good chance for you to check in with your provider about disease prevention and staying  healthy. In between checkups, there are plenty of things you can do on your own. Experts have done a lot of research about which lifestyle changes and preventive measures are most likely to keep you healthy. Ask your health care provider for more information. Weight and diet Eat a healthy diet  Be sure to include plenty of vegetables, fruits, low-fat dairy products, and lean protein.  Do not eat a lot of foods high in solid fats, added sugars, or salt.  Get regular exercise. This is one of the most important things you can do for your health. ? Most adults should exercise for at least 150 minutes each week. The exercise should increase your heart rate and make you sweat (moderate-intensity exercise). ? Most adults should also do strengthening exercises at least twice a week. This is in addition to the moderate-intensity exercise.  Maintain a healthy weight  Body mass index (BMI) is a measurement that can be used to identify possible weight problems. It estimates body fat based on height and weight. Your health care provider can help determine your BMI and help you achieve or maintain a healthy weight.  For females 64 years of age and older: ? A BMI below 18.5 is considered underweight. ? A BMI of 18.5 to 24.9 is normal. ? A BMI of 25 to 29.9 is considered overweight. ?  A BMI of 30 and above is considered obese.  Watch levels of cholesterol and blood lipids  You should start having your blood tested for lipids and cholesterol at 73 years of age, then have this test every 5 years.  You may need to have your cholesterol levels checked more often if: ? Your lipid or cholesterol levels are high. ? You are older than 72 years of age. ? You are at high risk for heart disease.  Cancer screening Lung Cancer  Lung cancer screening is recommended for adults 70-44 years old who are at high risk for lung cancer because of a history of smoking.  A yearly low-dose CT scan of the lungs is  recommended for people who: ? Currently smoke. ? Have quit within the past 15 years. ? Have at least a 30-pack-year history of smoking. A pack year is smoking an average of one pack of cigarettes a day for 1 year.  Yearly screening should continue until it has been 15 years since you quit.  Yearly screening should stop if you develop a health problem that would prevent you from having lung cancer treatment.  Breast Cancer  Practice breast self-awareness. This means understanding how your breasts normally appear and feel.  It also means doing regular breast self-exams. Let your health care provider know about any changes, no matter how small.  If you are in your 20s or 30s, you should have a clinical breast exam (CBE) by a health care provider every 1-3 years as part of a regular health exam.  If you are 61 or older, have a CBE every year. Also consider having a breast X-ray (mammogram) every year.  If you have a family history of breast cancer, talk to your health care provider about genetic screening.  If you are at high risk for breast cancer, talk to your health care provider about having an MRI and a mammogram every year.  Breast cancer gene (BRCA) assessment is recommended for women who have family members with BRCA-related cancers. BRCA-related cancers include: ? Breast. ? Ovarian. ? Tubal. ? Peritoneal cancers.  Results of the assessment will determine the need for genetic counseling and BRCA1 and BRCA2 testing.  Cervical Cancer Your health care provider may recommend that you be screened regularly for cancer of the pelvic organs (ovaries, uterus, and vagina). This screening involves a pelvic examination, including checking for microscopic changes to the surface of your cervix (Pap test). You may be encouraged to have this screening done every 3 years, beginning at age 72.  For women ages 32-65, health care providers may recommend pelvic exams and Pap testing every 3 years,  or they may recommend the Pap and pelvic exam, combined with testing for human papilloma virus (HPV), every 5 years. Some types of HPV increase your risk of cervical cancer. Testing for HPV may also be done on women of any age with unclear Pap test results.  Other health care providers may not recommend any screening for nonpregnant women who are considered low risk for pelvic cancer and who do not have symptoms. Ask your health care provider if a screening pelvic exam is right for you.  If you have had past treatment for cervical cancer or a condition that could lead to cancer, you need Pap tests and screening for cancer for at least 20 years after your treatment. If Pap tests have been discontinued, your risk factors (such as having a new sexual partner) need to be reassessed to determine if  screening should resume. Some women have medical problems that increase the chance of getting cervical cancer. In these cases, your health care provider may recommend more frequent screening and Pap tests.  Colorectal Cancer  This type of cancer can be detected and often prevented.  Routine colorectal cancer screening usually begins at 72 years of age and continues through 72 years of age.  Your health care provider may recommend screening at an earlier age if you have risk factors for colon cancer.  Your health care provider may also recommend using home test kits to check for hidden blood in the stool.  A small camera at the end of a tube can be used to examine your colon directly (sigmoidoscopy or colonoscopy). This is done to check for the earliest forms of colorectal cancer.  Routine screening usually begins at age 25.  Direct examination of the colon should be repeated every 5-10 years through 72 years of age. However, you may need to be screened more often if early forms of precancerous polyps or small growths are found.  Skin Cancer  Check your skin from head to toe regularly.  Tell your  health care provider about any new moles or changes in moles, especially if there is a change in a mole's shape or color.  Also tell your health care provider if you have a mole that is larger than the size of a pencil eraser.  Always use sunscreen. Apply sunscreen liberally and repeatedly throughout the day.  Protect yourself by wearing long sleeves, pants, a wide-brimmed hat, and sunglasses whenever you are outside.  Heart disease, diabetes, and high blood pressure  High blood pressure causes heart disease and increases the risk of stroke. High blood pressure is more likely to develop in: ? People who have blood pressure in the high end of the normal range (130-139/85-89 mm Hg). ? People who are overweight or obese. ? People who are African American.  If you are 37-4 years of age, have your blood pressure checked every 3-5 years. If you are 3 years of age or older, have your blood pressure checked every year. You should have your blood pressure measured twice-once when you are at a hospital or clinic, and once when you are not at a hospital or clinic. Record the average of the two measurements. To check your blood pressure when you are not at a hospital or clinic, you can use: ? An automated blood pressure machine at a pharmacy. ? A home blood pressure monitor.  If you are between 70 years and 26 years old, ask your health care provider if you should take aspirin to prevent strokes.  Have regular diabetes screenings. This involves taking a blood sample to check your fasting blood sugar level. ? If you are at a normal weight and have a low risk for diabetes, have this test once every three years after 72 years of age. ? If you are overweight and have a high risk for diabetes, consider being tested at a younger age or more often. Preventing infection Hepatitis B  If you have a higher risk for hepatitis B, you should be screened for this virus. You are considered at high risk for  hepatitis B if: ? You were born in a country where hepatitis B is common. Ask your health care provider which countries are considered high risk. ? Your parents were born in a high-risk country, and you have not been immunized against hepatitis B (hepatitis B vaccine). ? You have  HIV or AIDS. ? You use needles to inject street drugs. ? You live with someone who has hepatitis B. ? You have had sex with someone who has hepatitis B. ? You get hemodialysis treatment. ? You take certain medicines for conditions, including cancer, organ transplantation, and autoimmune conditions.  Hepatitis C  Blood testing is recommended for: ? Everyone born from [birth year] through 1965. ? Anyone with known risk factors for hepatitis C.  Sexually transmitted infections (STIs)  You should be screened for sexually transmitted infections (STIs) including gonorrhea and chlamydia if: ? You are sexually active and are younger than 73 years of age. ? You are older than 72 years of age and your health care provider tells you that you are at risk for this type of infection. ? Your sexual activity has changed since you were last screened and you are at an increased risk for chlamydia or gonorrhea. Ask your health care provider if you are at risk.  If you do not have HIV, but are at risk, it may be recommended that you take a prescription medicine daily to prevent HIV infection. This is called pre-exposure prophylaxis (PrEP). You are considered at risk if: ? You are sexually active and do not regularly use condoms or know the HIV status of your partner(s). ? You take drugs by injection. ? You are sexually active with a partner who has HIV.  Talk with your health care provider about whether you are at high risk of being infected with HIV. If you choose to begin PrEP, you should first be tested for HIV. You should then be tested every 3 months for as long as you are taking PrEP. Pregnancy  If you are premenopausal and you may  become pregnant, ask your health care provider about preconception counseling.  If you may become pregnant, take 400 to 800 micrograms (mcg) of folic acid every day.  If you want to prevent pregnancy, talk to your health care provider about birth control (contraception). Osteoporosis and menopause  Osteoporosis is a disease in which the bones lose minerals and strength with aging. This can result in serious bone fractures. Your risk for osteoporosis can be identified using a bone density scan.  If you are 73 years of age or older, or if you are at risk for osteoporosis and fractures, ask your health care provider if you should be screened.  Ask your health care provider whether you should take a calcium or vitamin D supplement to lower your risk for osteoporosis.  Menopause may have certain physical symptoms and risks.  Hormone replacement therapy may reduce some of these symptoms and risks. Talk to your health care provider about whether hormone replacement therapy is right for you. Follow these instructions at home:  Schedule regular health, dental, and eye exams.  Stay current with your immunizations.  Do not use any tobacco products including cigarettes, chewing tobacco, or electronic cigarettes.  If you are pregnant, do not drink alcohol.  If you are breastfeeding, limit how much and how often you drink alcohol.  Limit alcohol intake to no more than 1 drink per day for nonpregnant women. One drink equals 12 ounces of beer, 5 ounces of wine, or 1 ounces of hard liquor.  Do not use street drugs.  Do not share needles.  Ask your health care provider for help if you need support or information about quitting drugs.  Tell your health care provider if you often feel depressed.  Tell your health care provider  if you have ever been abused or do not feel safe at home. This information is not intended to replace advice given to you by your health care provider. Make sure you  discuss any questions you have with your health care provider. Document Released: 09/21/2010 Document Revised: 08/14/2015 Document Reviewed: 12/10/2014 Elsevier Interactive Patient Education  [employer].

## 2018-01-06 DIAGNOSIS — H5211 Myopia, right eye: Secondary | ICD-10-CM | POA: Diagnosis not present

## 2018-02-27 DIAGNOSIS — Z1231 Encounter for screening mammogram for malignant neoplasm of breast: Secondary | ICD-10-CM | POA: Diagnosis not present

## 2018-02-27 DIAGNOSIS — Z01419 Encounter for gynecological examination (general) (routine) without abnormal findings: Secondary | ICD-10-CM | POA: Diagnosis not present

## 2018-04-09 DIAGNOSIS — R7303 Prediabetes: Secondary | ICD-10-CM | POA: Insufficient documentation

## 2018-04-09 NOTE — Progress Notes (Signed)
Subjective:    Patient ID: Sherry Mitchell, female    DOB: January 13, 1946, 73 y.o.   MRN: [id number]  HPI She is here for a physical exam.   She denies any changes in her health since she was here last 1 year ago.  Overall she feels well and has no concerns.   Medications and allergies reviewed with patient and updated if appropriate.  Patient Active Problem List   Diagnosis Date Noted  . Prediabetes 04/09/2018  . Neck pain on right side 05/25/2017  . Osteoporosis 04/24/2016  . Osteoarthritis   . Hypertension   . Colon polyps     Current Outpatient Medications on File Prior to Visit  Medication Sig Dispense Refill  . Calcium Carbonate-Vitamin D (CALCIUM-D PO) Take 1 tablet by mouth.    . diltiazem (DILT-XR) 240 MG 24 hr capsule Take 1 capsule (240 mg total) by mouth daily. 90 capsule 3  . lisinopril-hydrochlorothiazide (PRINZIDE,ZESTORETIC) 20-25 MG tablet Take 1 tablet by mouth daily. 90 tablet 3   No current facility-administered medications on file prior to visit.     Past Medical History:  Diagnosis Date  . Colon polyps 02/2003, 02/2009   Adenomatous polyps  . Hypertension   . Osteoarthritis    hands  . Osteopenia    DEXA 02/2011, 02/2013: -1.9    Past Surgical History:  Procedure Laterality Date  . BREAST ENHANCEMENT SURGERY  1987  . COLONOSCOPY      Social History   Socioeconomic History  . Marital status: Married    Spouse name: Not on file  . Number of children: 2  . Years of education: Not on file  . Highest education level: Not on file  Occupational History  . Not on file  Social Needs  . Financial resource strain: Not hard at all  . Food insecurity:    Worry: Never true    Inability: Never true  . Transportation needs:    Medical: No    Non-medical: No  Tobacco Use  . Smoking status: Never Smoker  . Smokeless tobacco: Never Used  Substance and Sexual Activity  . Alcohol use: Yes    Comment: wine with dinner  . Drug use: No  . Sexual  activity: Not Currently  Lifestyle  . Physical activity:    Days per week: 4 days    Minutes per session: 50 min  . Stress: Not at all  Relationships  . Social connections:    Talks on phone: More than three times a week    Gets together: More than three times a week    Attends religious service: Never    Active member of club or organization: Yes    Attends meetings of clubs or organizations: More than 4 times per year    Relationship status: Married  Other Topics Concern  . Not on file  Social History Narrative   Exercises regularly    Family History  Problem Relation Age of Onset  . Cancer Mother        cancer of saliva glands  . Heart disease Other   . Colon cancer Neg Hx   . Esophageal cancer Neg Hx   . Stomach cancer Neg Hx   . Rectal cancer Neg Hx     Review of Systems  Constitutional: Negative for chills, fatigue and fever.  HENT: Positive for postnasal drip.   Eyes: Negative for visual disturbance.  Respiratory: Negative for cough, shortness of breath and wheezing.   Cardiovascular:  Negative for chest pain, palpitations and leg swelling.  Gastrointestinal: Negative for abdominal pain, blood in stool, constipation, diarrhea and nausea.       No gerd  Genitourinary: Negative for dysuria and hematuria.  Musculoskeletal: Positive for arthralgias (mild, trigger finger).  Skin: Negative for rash.  Neurological: Positive for numbness (occ in left arm - goes away with movement). Negative for dizziness, weakness, light-headedness and headaches.  Psychiatric/Behavioral: Negative for dysphoric mood. The patient is not nervous/anxious.        Objective:   Vitals:   04/11/18 1104  BP: 138/74  Pulse: 63  Resp: 16  Temp: 98.3 F (36.8 C)  SpO2: 97%   Filed Weights   04/11/18 1104  Weight: 156 lb 6.4 oz (70.9 kg)   Body mass index is 24.5 kg/m.  BP Readings from Last 3 Encounters:  04/11/18 138/74  11/24/17 128/74  07/07/17 138/82    Wt Readings from  Last 3 Encounters:  04/11/18 156 lb 6.4 oz (70.9 kg)  11/24/17 152 lb (68.9 kg)  07/07/17 148 lb (67.1 kg)     Physical Exam Constitutional: She appears well-developed and well-nourished. No distress.  HENT:  Head: Normocephalic and atraumatic.  Right Ear: External ear normal. Normal ear canal and TM Left Ear: External ear normal.  Normal ear canal and TM Mouth/Throat: Oropharynx is clear and moist.  Eyes: Conjunctivae and EOM are normal.  Neck: Neck supple. No tracheal deviation present. No thyromegaly present.  No carotid bruit  Cardiovascular: Normal rate, regular rhythm and normal heart sounds.   No murmur heard.  No edema. Pulmonary/Chest: Effort normal and breath sounds normal. No respiratory distress. She has no wheezes. She has no rales.  Breast: deferred to Gyn Abdominal: Soft. She exhibits no distension. There is no tenderness.  Lymphadenopathy: She has no cervical adenopathy.  Skin: Skin is warm and dry. She is not diaphoretic.  Psychiatric: She has a normal mood and affect. Her behavior is normal.        Assessment & Plan:   Physical exam: Screening blood work    ordered Immunizations   Had shingrix, others up to date Colonoscopy    Up to date  - due 02/2019 Mammogram   Up to date  16  - Dr Benjie Karvonen Dexa   Due this month-ordered Eye exams   Up to date  EKG  Last done 2019 Exercise   Regular - goes to [facility] 2/week, walking - 4-5 miles twice a week Weight  BMI normal Skin    Sees derm annually - april Substance abuse  none  See Problem List for Assessment and Plan of chronic medical problems.    Follow-up annually

## 2018-04-09 NOTE — Patient Instructions (Addendum)
Tests ordered today. Your results will be released to [place] (or called to you) after review, usually within 72hours after test completion. If any changes need to be made, you will be notified at that same time.  All other Health Maintenance issues reviewed.   All recommended immunizations and age-appropriate screenings are up-to-date or discussed.  No immunizations administered today.   Medications reviewed and updated.  Changes include :  none    Please followup in 1 year   Health Maintenance, Female Adopting a healthy lifestyle and getting preventive care can go a long way to promote health and wellness. Talk with your health care provider about what schedule of regular examinations is right for you. This is a good chance for you to check in with your provider about disease prevention and staying healthy. In between checkups, there are plenty of things you can do on your own. Experts have done a lot of research about which lifestyle changes and preventive measures are most likely to keep you healthy. Ask your health care provider for more information. Weight and diet Eat a healthy diet  Be sure to include plenty of vegetables, fruits, low-fat dairy products, and lean protein.  Do not eat a lot of foods high in solid fats, added sugars, or salt.  Get regular exercise. This is one of the most important things you can do for your health. ? Most adults should exercise for at least 150 minutes each week. The exercise should increase your heart rate and make you sweat (moderate-intensity exercise). ? Most adults should also do strengthening exercises at least twice a week. This is in addition to the moderate-intensity exercise. Maintain a healthy weight  Body mass index (BMI) is a measurement that can be used to identify possible weight problems. It estimates body fat based on height and weight. Your health care provider can help determine your BMI and help you achieve or maintain a  healthy weight.  For females 46 years of age and older: ? A BMI below 18.5 is considered underweight. ? A BMI of 18.5 to 24.9 is normal. ? A BMI of 25 to 29.9 is considered overweight. ? A BMI of 30 and above is considered obese. Watch levels of cholesterol and blood lipids  You should start having your blood tested for lipids and cholesterol at 72 years of age, then have this test every 5 years.  You may need to have your cholesterol levels checked more often if: ? Your lipid or cholesterol levels are high. ? You are older than 73 years of age. ? You are at high risk for heart disease. Cancer screening Lung Cancer  Lung cancer screening is recommended for adults 71-42 years old who are at high risk for lung cancer because of a history of smoking.  A yearly low-dose CT scan of the lungs is recommended for people who: ? Currently smoke. ? Have quit within the past 15 years. ? Have at least a 30-pack-year history of smoking. A pack year is smoking an average of one pack of cigarettes a day for 1 year.  Yearly screening should continue until it has been 15 years since you quit.  Yearly screening should stop if you develop a health problem that would prevent you from having lung cancer treatment. Breast Cancer  Practice breast self-awareness. This means understanding how your breasts normally appear and feel.  It also means doing regular breast self-exams. Let your health care provider know about any changes, no matter how small.  If you are in your 20s or 30s, you should have a clinical breast exam (CBE) by a health care provider every 1-3 years as part of a regular health exam.  If you are 40 or older, have a CBE every year. Also consider having a breast X-ray (mammogram) every year.  If you have a family history of breast cancer, talk to your health care provider about genetic screening.  If you are at high risk for breast cancer, talk to your health care provider about having  an MRI and a mammogram every year.  Breast cancer gene (BRCA) assessment is recommended for women who have family members with BRCA-related cancers. BRCA-related cancers include: ? Breast. ? Ovarian. ? Tubal. ? Peritoneal cancers.  Results of the assessment will determine the need for genetic counseling and BRCA1 and BRCA2 testing. Cervical Cancer Your health care provider may recommend that you be screened regularly for cancer of the pelvic organs (ovaries, uterus, and vagina). This screening involves a pelvic examination, including checking for microscopic changes to the surface of your cervix (Pap test). You may be encouraged to have this screening done every 3 years, beginning at age 21.  For women ages 30-65, health care providers may recommend pelvic exams and Pap testing every 3 years, or they may recommend the Pap and pelvic exam, combined with testing for human papilloma virus (HPV), every 5 years. Some types of HPV increase your risk of cervical cancer. Testing for HPV may also be done on women of any age with unclear Pap test results.  Other health care providers may not recommend any screening for nonpregnant women who are considered low risk for pelvic cancer and who do not have symptoms. Ask your health care provider if a screening pelvic exam is right for you.  If you have had past treatment for cervical cancer or a condition that could lead to cancer, you need Pap tests and screening for cancer for at least 20 years after your treatment. If Pap tests have been discontinued, your risk factors (such as having a new sexual partner) need to be reassessed to determine if screening should resume. Some women have medical problems that increase the chance of getting cervical cancer. In these cases, your health care provider may recommend more frequent screening and Pap tests. Colorectal Cancer  This type of cancer can be detected and often prevented.  Routine colorectal cancer screening  usually begins at 73 years of age and continues through 73 years of age.  Your health care provider may recommend screening at an earlier age if you have risk factors for colon cancer.  Your health care provider may also recommend using home test kits to check for hidden blood in the stool.  A small camera at the end of a tube can be used to examine your colon directly (sigmoidoscopy or colonoscopy). This is done to check for the earliest forms of colorectal cancer.  Routine screening usually begins at age 50.  Direct examination of the colon should be repeated every 5-10 years through 73 years of age. However, you may need to be screened more often if early forms of precancerous polyps or small growths are found. Skin Cancer  Check your skin from head to toe regularly.  Tell your health care provider about any new moles or changes in moles, especially if there is a change in a mole's shape or color.  Also tell your health care provider if you have a mole that is larger than the   size of a pencil eraser.  Always use sunscreen. Apply sunscreen liberally and repeatedly throughout the day.  Protect yourself by wearing long sleeves, pants, a wide-brimmed hat, and sunglasses whenever you are outside. Heart disease, diabetes, and high blood pressure  High blood pressure causes heart disease and increases the risk of stroke. High blood pressure is more likely to develop in: ? People who have blood pressure in the high end of the normal range (130-139/85-89 mm Hg). ? People who are overweight or obese. ? People who are African American.  If you are 18-39 years of age, have your blood pressure checked every 3-5 years. If you are 40 years of age or older, have your blood pressure checked every year. You should have your blood pressure measured twice-once when you are at a hospital or clinic, and once when you are not at a hospital or clinic. Record the average of the two measurements. To check your  blood pressure when you are not at a hospital or clinic, you can use: ? An automated blood pressure machine at a pharmacy. ? A home blood pressure monitor.  If you are between 55 years and 79 years old, ask your health care provider if you should take aspirin to prevent strokes.  Have regular diabetes screenings. This involves taking a blood sample to check your fasting blood sugar level. ? If you are at a normal weight and have a low risk for diabetes, have this test once every three years after 73 years of age. ? If you are overweight and have a high risk for diabetes, consider being tested at a younger age or more often. Preventing infection Hepatitis B  If you have a higher risk for hepatitis B, you should be screened for this virus. You are considered at high risk for hepatitis B if: ? You were born in a country where hepatitis B is common. Ask your health care provider which countries are considered high risk. ? Your parents were born in a high-risk country, and you have not been immunized against hepatitis B (hepatitis B vaccine). ? You have HIV or AIDS. ? You use needles to inject street drugs. ? You live with someone who has hepatitis B. ? You have had sex with someone who has hepatitis B. ? You get hemodialysis treatment. ? You take certain medicines for conditions, including cancer, organ transplantation, and autoimmune conditions. Hepatitis C  Blood testing is recommended for: ? Everyone born from 1945 through 1965. ? Anyone with known risk factors for hepatitis C. Sexually transmitted infections (STIs)  You should be screened for sexually transmitted infections (STIs) including gonorrhea and chlamydia if: ? You are sexually active and are younger than 73 years of age. ? You are older than 73 years of age and your health care provider tells you that you are at risk for this type of infection. ? Your sexual activity has changed since you were last screened and you are at an  increased risk for chlamydia or gonorrhea. Ask your health care provider if you are at risk.  If you do not have HIV, but are at risk, it may be recommended that you take a prescription medicine daily to prevent HIV infection. This is called pre-exposure prophylaxis (PrEP). You are considered at risk if: ? You are sexually active and do not regularly use condoms or know the HIV status of your partner(s). ? You take drugs by injection. ? You are sexually active with a partner who has HIV.   Talk with your health care provider about whether you are at high risk of being infected with HIV. If you choose to begin PrEP, you should first be tested for HIV. You should then be tested every 3 months for as long as you are taking PrEP. Pregnancy  If you are premenopausal and you may become pregnant, ask your health care provider about preconception counseling.  If you may become pregnant, take 400 to 800 micrograms (mcg) of folic acid every day.  If you want to prevent pregnancy, talk to your health care provider about birth control (contraception). Osteoporosis and menopause  Osteoporosis is a disease in which the bones lose minerals and strength with aging. This can result in serious bone fractures. Your risk for osteoporosis can be identified using a bone density scan.  If you are 65 years of age or older, or if you are at risk for osteoporosis and fractures, ask your health care provider if you should be screened.  Ask your health care provider whether you should take a calcium or vitamin D supplement to lower your risk for osteoporosis.  Menopause may have certain physical symptoms and risks.  Hormone replacement therapy may reduce some of these symptoms and risks. Talk to your health care provider about whether hormone replacement therapy is right for you. Follow these instructions at home:  Schedule regular health, dental, and eye exams.  Stay current with your immunizations.  Do not use  any tobacco products including cigarettes, chewing tobacco, or electronic cigarettes.  If you are pregnant, do not drink alcohol.  If you are breastfeeding, limit how much and how often you drink alcohol.  Limit alcohol intake to no more than 1 drink per day for nonpregnant women. One drink equals 12 ounces of beer, 5 ounces of wine, or 1 ounces of hard liquor.  Do not use street drugs.  Do not share needles.  Ask your health care provider for help if you need support or information about quitting drugs.  Tell your health care provider if you often feel depressed.  Tell your health care provider if you have ever been abused or do not feel safe at home. This information is not intended to replace advice given to you by your health care provider. Make sure you discuss any questions you have with your health care provider. Document Released: 09/21/2010 Document Revised: 08/14/2015 Document Reviewed: 12/10/2014 Elsevier Interactive Patient Education  2019 [employer] Inc.  

## 2018-04-10 ENCOUNTER — Ambulatory Visit: Payer: Medicare HMO | Admitting: Internal Medicine

## 2018-04-11 ENCOUNTER — Other Ambulatory Visit (INDEPENDENT_AMBULATORY_CARE_PROVIDER_SITE_OTHER): Payer: Medicare HMO

## 2018-04-11 ENCOUNTER — Ambulatory Visit (INDEPENDENT_AMBULATORY_CARE_PROVIDER_SITE_OTHER): Payer: Medicare HMO | Admitting: Internal Medicine

## 2018-04-11 ENCOUNTER — Encounter: Payer: Self-pay | Admitting: Internal Medicine

## 2018-04-11 VITALS — BP 138/74 | HR 63 | Temp 98.3°F | Resp 16 | Ht 67.0 in | Wt 156.4 lb

## 2018-04-11 DIAGNOSIS — I10 Essential (primary) hypertension: Secondary | ICD-10-CM

## 2018-04-11 DIAGNOSIS — Z0000 Encounter for general adult medical examination without abnormal findings: Secondary | ICD-10-CM

## 2018-04-11 DIAGNOSIS — M810 Age-related osteoporosis without current pathological fracture: Secondary | ICD-10-CM

## 2018-04-11 DIAGNOSIS — R7303 Prediabetes: Secondary | ICD-10-CM

## 2018-04-11 DIAGNOSIS — M6530 Trigger finger, unspecified finger: Secondary | ICD-10-CM | POA: Diagnosis not present

## 2018-04-11 LAB — CBC WITH DIFFERENTIAL/PLATELET
BASOS PCT: 1 % (ref 0.0–3.0)
Basophils Absolute: 0.1 10*3/uL (ref 0.0–0.1)
EOS PCT: 2.7 % (ref 0.0–5.0)
Eosinophils Absolute: 0.2 10*3/uL (ref 0.0–0.7)
HCT: 44 % (ref 36.0–46.0)
Hemoglobin: 14.9 g/dL (ref 12.0–15.0)
Lymphocytes Relative: 46 % (ref 12.0–46.0)
Lymphs Abs: 2.8 10*3/uL (ref 0.7–4.0)
MCHC: 33.9 g/dL (ref 30.0–36.0)
MCV: 93.1 fl (ref 78.0–100.0)
MONO ABS: 0.8 10*3/uL (ref 0.1–1.0)
Monocytes Relative: 13.7 % — ABNORMAL HIGH (ref 3.0–12.0)
Neutro Abs: 2.2 10*3/uL (ref 1.4–7.7)
Neutrophils Relative %: 36.6 % — ABNORMAL LOW (ref 43.0–77.0)
Platelets: 280 10*3/uL (ref 150.0–400.0)
RBC: 4.73 Mil/uL (ref 3.87–5.11)
RDW: 13.4 % (ref 11.5–15.5)
WBC: 6 10*3/uL (ref 4.0–10.5)

## 2018-04-11 LAB — COMPREHENSIVE METABOLIC PANEL
ALT: 14 U/L (ref 0–35)
AST: 18 U/L (ref 0–37)
Albumin: 4.5 g/dL (ref 3.5–5.2)
Alkaline Phosphatase: 72 U/L (ref 39–117)
BUN: 16 mg/dL (ref 6–23)
CO2: 30 mEq/L (ref 19–32)
Calcium: 10.4 mg/dL (ref 8.4–10.5)
Chloride: 100 mEq/L (ref 96–112)
Creatinine, Ser: 0.65 mg/dL (ref 0.40–1.20)
GFR: 89.43 mL/min (ref >60.00)
Glucose, Bld: 110 mg/dL — ABNORMAL HIGH (ref 70–99)
Potassium: 3.8 mEq/L (ref 3.5–5.1)
Sodium: 138 mEq/L (ref 135–145)
Total Bilirubin: 0.6 mg/dL (ref 0.2–1.2)
Total Protein: 7.7 g/dL (ref 6.0–8.3)

## 2018-04-11 LAB — LIPID PANEL
Cholesterol: 228 mg/dL — ABNORMAL HIGH (ref 0–200)
HDL: 80.2 mg/dL (ref >39.00)
LDL Cholesterol: 134 mg/dL — ABNORMAL HIGH (ref 0–99)
NONHDL: 148.14
Total CHOL/HDL Ratio: 3
Triglycerides: 69 mg/dL (ref 0.0–149.0)
VLDL: 13.8 mg/dL (ref 0.0–40.0)

## 2018-04-11 LAB — TSH: TSH: 0.91 u[IU]/mL (ref 0.35–4.50)

## 2018-04-11 LAB — VITAMIN D 25 HYDROXY (VIT D DEFICIENCY, FRACTURES): VITD: 39.87 ng/mL (ref 30.00–100.00)

## 2018-04-11 LAB — HEMOGLOBIN A1C: Hgb A1c MFr Bld: 6.1 % (ref 4.6–6.5)

## 2018-04-11 NOTE — Assessment & Plan Note (Signed)
Tolerable at this time-does not feel that she needs to see orthopedics, but advised she can let me know if she does in the future I can refer her

## 2018-04-11 NOTE — Assessment & Plan Note (Signed)
Discussed that her sugars have been elevated and likely in prediabetic range over the past several years Check A1c Fairly compliant with low sugar diet Decrease carbohydrate intake Continue regular exercise

## 2018-04-11 NOTE — Assessment & Plan Note (Signed)
BP well controlled Current regimen effective and well tolerated Continue current medications at current doses cmp  

## 2018-04-11 NOTE — Assessment & Plan Note (Addendum)
Taking 600 mg once a daily Vitamin d daily - ? Amount - will check level dexa ordered Exercising regularly  She was on EV stent in the past.  She states she would consider medication again.

## 2018-04-12 ENCOUNTER — Other Ambulatory Visit: Payer: Self-pay | Admitting: Internal Medicine

## 2018-04-12 ENCOUNTER — Encounter: Payer: Self-pay | Admitting: Internal Medicine

## 2018-04-12 DIAGNOSIS — E785 Hyperlipidemia, unspecified: Secondary | ICD-10-CM | POA: Insufficient documentation

## 2018-04-14 ENCOUNTER — Ambulatory Visit (INDEPENDENT_AMBULATORY_CARE_PROVIDER_SITE_OTHER)
Admission: RE | Admit: 2018-04-14 | Discharge: 2018-04-14 | Disposition: A | Discharge status: Home / Self Care | Payer: Medicare HMO | Source: Ambulatory Visit | Attending: Internal Medicine | Admitting: Internal Medicine

## 2018-04-14 DIAGNOSIS — M810 Age-related osteoporosis without current pathological fracture: Secondary | ICD-10-CM | POA: Diagnosis not present

## 2018-04-17 ENCOUNTER — Encounter: Payer: Self-pay | Admitting: Internal Medicine

## 2018-05-16 DIAGNOSIS — N898 Other specified noninflammatory disorders of vagina: Secondary | ICD-10-CM | POA: Diagnosis not present

## 2018-05-16 DIAGNOSIS — R300 Dysuria: Secondary | ICD-10-CM | POA: Diagnosis not present

## 2018-06-01 DIAGNOSIS — N390 Urinary tract infection, site not specified: Secondary | ICD-10-CM | POA: Diagnosis not present

## 2018-06-14 ENCOUNTER — Encounter: Payer: Self-pay | Admitting: Internal Medicine

## 2018-07-19 DIAGNOSIS — Z85828 Personal history of other malignant neoplasm of skin: Secondary | ICD-10-CM | POA: Diagnosis not present

## 2018-07-19 DIAGNOSIS — L821 Other seborrheic keratosis: Secondary | ICD-10-CM | POA: Diagnosis not present

## 2018-07-19 DIAGNOSIS — L570 Actinic keratosis: Secondary | ICD-10-CM | POA: Diagnosis not present

## 2018-07-19 DIAGNOSIS — L814 Other melanin hyperpigmentation: Secondary | ICD-10-CM | POA: Diagnosis not present

## 2018-10-31 DIAGNOSIS — R69 Illness, unspecified: Secondary | ICD-10-CM | POA: Diagnosis not present

## 2018-11-29 ENCOUNTER — Ambulatory Visit (INDEPENDENT_AMBULATORY_CARE_PROVIDER_SITE_OTHER): Payer: Medicare HMO | Admitting: *Deleted

## 2018-11-29 DIAGNOSIS — Z0000 Encounter for general adult medical examination without abnormal findings: Secondary | ICD-10-CM | POA: Diagnosis not present

## 2018-11-29 NOTE — Progress Notes (Addendum)
Subjective:   Sherry Mitchell is a 73 y.o. female who presents for Medicare Annual (Subsequent) preventive examination. I connected with patient by a telephone and verified that I am speaking with the correct person using two identifiers. Patient stated full name and DOB. Patient gave permission to continue with telephonic visit. Patient's location was at home and Nurse's location was at [place] office.   Review of Systems:   Cardiac Risk Factors include: advanced age (>39men, >49 women);hypertension Sleep patterns: feels rested on waking, gets up 1 times nightly to void and sleeps 7-8 hours nightly.    Home Safety/Smoke Alarms: Feels safe in home. Smoke alarms in place.  Living environment; residence and Firearm Safety: 2-story house, can live on one level Lives with husband, no needs for DME, good support system  Seat Belt Safety/Bike Helmet: Wears seat belt.      Objective:     Vitals: There were no vitals taken for this visit.  There is no height or weight on file to calculate BMI.  Advanced Directives 11/29/2018 11/24/2017 11/19/2016 05/08/2014 03/10/2014 02/19/2014  Does Patient Have a Medical Advance Directive? Yes Yes Yes Yes No Yes  Type of Paramedic of [place];Living will [place];Living will [place];Living will - - [place]  Does patient want to make changes to medical advance directive? - - - Yes - information given - -  Copy of [place] in Chart? Yes - validated most recent copy scanned in chart (See row information) No - copy requested No - copy requested No - copy requested - -    Tobacco Social History   Tobacco Use  Smoking Status Never Smoker  Smokeless Tobacco Never Used     Counseling given: Not Answered  Past Medical History:  Diagnosis Date  . Colon polyps 02/2003, 02/2009   Adenomatous polyps  . Hypertension   . Osteoarthritis    hands  .  Osteopenia    DEXA 02/2011, 02/2013: -1.9   Past Surgical History:  Procedure Laterality Date  . BREAST ENHANCEMENT SURGERY  1987  . COLONOSCOPY     Family History  Problem Relation Age of Onset  . Cancer Mother        cancer of saliva glands  . Heart disease Other   . Colon cancer Neg Hx   . Esophageal cancer Neg Hx   . Stomach cancer Neg Hx   . Rectal cancer Neg Hx    Social History   Socioeconomic History  . Marital status: Married    Spouse name: Not on file  . Number of children: 2  . Years of education: Not on file  . Highest education level: Not on file  Occupational History  . Occupation: retired  Scientific laboratory technician  . Financial resource strain: Not hard at all  . Food insecurity    Worry: Never true    Inability: Never true  . Transportation needs    Medical: No    Non-medical: No  Tobacco Use  . Smoking status: Never Smoker  . Smokeless tobacco: Never Used  Substance and Sexual Activity  . Alcohol use: Yes    Comment: wine with dinner  . Drug use: No  . Sexual activity: Not Currently  Lifestyle  . Physical activity    Days per week: 4 days    Minutes per session: 50 min  . Stress: Not at all  Relationships  . Social connections  Talks on phone: More than three times a week    Gets together: More than three times a week    Attends religious service: Never    Active member of club or organization: Yes    Attends meetings of clubs or organizations: More than 4 times per year    Relationship status: Married  Other Topics Concern  . Not on file  Social History Narrative   Exercises regularly    Outpatient Encounter Medications as of 11/29/2018  Medication Sig  . Calcium Carbonate-Vitamin D (CALCIUM-D PO) Take 1 tablet by mouth.  . Cholecalciferol (VITAMIN D3) 50 MCG (2000 UT) TABS Take 2,000 Units by mouth daily.  [name] DILT-XR 240 MG 24 hr capsule TAKE ONE CAPSULE BY MOUTH ONE TIME DAILY   . lisinopril-hydrochlorothiazide (PRINZIDE,ZESTORETIC) 20-25 MG  tablet take 1 tablet by mouth daily  . Multiple Vitamin (MULTIVITAMIN) tablet Take 1 tablet by mouth daily.   No facility-administered encounter medications on file as of 11/29/2018.     Activities of Daily Living In your present state of health, do you have any difficulty performing the following activities: 11/29/2018  Hearing? N  Vision? N  Difficulty concentrating or making decisions? N  Walking or climbing stairs? N  Dressing or bathing? N  Doing errands, shopping? N  Preparing Food and eating ? N  Using the Toilet? N  In the past six months, have you accidently leaked urine? N  Do you have problems with loss of bowel control? N  Managing your Medications? N  Managing your Finances? N  Housekeeping or managing your Housekeeping? N  Some recent data might be hidden    Patient Care Team: Binnie Rail, MD as PCP - General (Internal Medicine) Ladene Artist, MD (Gastroenterology) Azucena Fallen, MD (Obstetrics and Gynecology) Almedia Balls, MD (Orthopedic Surgery)    Assessment:   This is a routine wellness examination for Sherry Mitchell. Physical assessment deferred to PCP.  Exercise Activities and Dietary recommendations Current Exercise Habits: Home exercise routine, Type of exercise: walking, Time (Minutes): 45, Frequency (Times/Week): 5, Weekly Exercise (Minutes/Week): 225, Intensity: Mild, Exercise limited by: orthopedic condition(s)  Diet (meal preparation, eat out, water intake, caffeinated beverages, dairy products, fruits and vegetables): in general, a "healthy" diet  , well balanced   Reviewed heart healthy diet. Encouraged patient to increase daily water and healthy fluid intake.   Goals    . Patient Stated     Stay physically and socially active. Continue to eat healthy and enjoy life.    . Take time for myself to be as healthy as possible     Read books, exercise, enjoy life, and family. Continue to do a lot with my grandchildren.       Fall Risk Fall Risk   11/29/2018 04/11/2018 11/24/2017 11/19/2016 04/05/2016  Falls in the past year? 0 0 No No No  Number falls in past yr: 0 - - - -  Injury with Fall? 0 - - - -    Depression Screen PHQ 2/9 Scores 11/29/2018 04/11/2018 11/24/2017 11/19/2016  PHQ - 2 Score 0 0 0 0  PHQ- 9 Score - - - 3     Cognitive Function       Ad8 score reviewed for issues:  Issues making decisions: no  Less interest in hobbies / activities: no  Repeats questions, stories (family complaining): no  Trouble using ordinary gadgets (microwave, computer, phone):no  Forgets the month or year: no  Mismanaging finances: no  Remembering appts: no  Daily problems with thinking and/or memory: no Ad8 score is= 0  Immunization History  Administered Date(s) Administered  . Influenza Split 02/26/2011, 11/29/2011, 12/07/2013  . Influenza, High Dose Seasonal PF 11/28/2012, 01/03/2015, 11/19/2016, 11/24/2017  . Influenza-Unspecified 01/03/2015, 12/26/2015  . Pneumococcal Conjugate-13 03/04/2014  . Pneumococcal Polysaccharide-23 03/06/2015  . Tdap 02/26/2011   Screening Tests Health Maintenance  Topic Date Due  . INFLUENZA VACCINE  10/21/2018  . MAMMOGRAM  02/22/2019  . COLONOSCOPY  03/06/2019  . DEXA SCAN  04/14/2020  . TETANUS/TDAP  02/25/2021  . Hepatitis C Screening  Completed  . PNA vac Low Risk Adult  Completed      Plan:     Reviewed health maintenance screenings with patient today and relevant education, vaccines, and/or referrals were provided.   I have personally reviewed and noted the following in the patient's chart:   . Medical and social history . Use of alcohol, tobacco or illicit drugs  . Current medications and supplements . Functional ability and status . Nutritional status . Physical activity . Advanced directives . List of other physicians . Screenings to include cognitive, depression, and falls . Referrals and appointments  In addition, I have reviewed and discussed with patient certain  preventive protocols, quality metrics, and best practice recommendations. A written personalized care plan for preventive services as well as general preventive health recommendations were provided to patient.     Michiel Cowboy, RN  11/29/2018   Medical screening examination/treatment/procedure(s) were performed by non-physician practitioner and as supervising physician I was immediately available for consultation/collaboration. I agree with above. Binnie Rail, MD

## 2018-12-22 DIAGNOSIS — R69 Illness, unspecified: Secondary | ICD-10-CM | POA: Diagnosis not present

## 2019-01-22 ENCOUNTER — Ambulatory Visit (INDEPENDENT_AMBULATORY_CARE_PROVIDER_SITE_OTHER): Payer: Medicare HMO

## 2019-01-22 ENCOUNTER — Ambulatory Visit: Payer: Medicare HMO | Admitting: Orthopaedic Surgery

## 2019-01-22 ENCOUNTER — Other Ambulatory Visit: Payer: Self-pay

## 2019-01-22 DIAGNOSIS — M25511 Pain in right shoulder: Secondary | ICD-10-CM | POA: Diagnosis not present

## 2019-01-22 MED ORDER — LISINOPRIL-HYDROCHLOROTHIAZIDE 20-25 MG PO TABS: 1.0000 | ORAL_TABLET | Freq: Every day | ORAL | 0 refills | Status: DC

## 2019-01-22 MED ORDER — DILTIAZEM HCL ER 240 MG PO CP24: 240.0000 mg | ORAL_CAPSULE | Freq: Every day | ORAL | 0 refills | Status: DC

## 2019-01-22 NOTE — Progress Notes (Signed)
Office Visit Note   Patient: Sherry Mitchell           Date of Birth: 08-01-1945           MRN: [id number] Visit Date: 01/22/2019              Requested by: Binnie Rail, MD [place],  [place] [id number] PCP: Binnie Rail, MD   Assessment & Plan: Visit Diagnoses:  1. Right shoulder pain, unspecified chronicity     Plan: Since she is doing much better than she was a month ago I recommended just activity modification as well as a topical anti-inflammatory such as Voltaren gel.  All question concerns were answered and addressed.  If it does flareup on her she will call to come in for a steroid injection to try next.  Follow-Up Instructions: Return if symptoms worsen or fail to improve.   Orders:  Orders Placed This Encounter  Procedures  . XR Shoulder Right   No orders of the defined types were placed in this encounter.     Procedures: No procedures performed   Clinical Data: No additional findings.   Subjective: Chief Complaint  Patient presents with  . Right Shoulder - Pain  The patient comes in today for evaluation treatment of right shoulder pain.  She said when she made that appointment a month ago she felt like she may have done something to her right shoulder but now things are calming down significantly.  She does report a history of a right shoulder arthroscopy done about 5 years ago.  She says she wonders if this is a flareup of pain.  Is not a constant ache like she was having before.  She says is feeling better today.  She denies any other recent injuries.  She denies any significant weakness in her right shoulder.  It is not detrimentally affecting her mobility or actives daily living.  HPI  Review of Systems She currently denies any headache, chest pain, shortness of breath, fever, chills, nausea, vomiting.  She denies any neck pain.  She denies any numbness and tingling of her right upper extremity.  Objective: Vital Signs: There were no  vitals taken for this visit.  Physical Exam She is alert and orient x3 and in no acute distress Ortho Exam Examination of her right shoulder shows full range of motion in all planes.  She does have grinding at the subacromial outlet space and at the [facility] joint but the rotator cuff itself feels strong.  The shoulder is clinically well located. Specialty Comments:  No specialty comments available.  Imaging: Xr Shoulder Right  Result Date: 01/22/2019 3 views of the right shoulder show arthritic changes at the [facility] joint.  There is a inferior osteophyte of the humeral head but the glenohumeral joint space is well-maintained.  The shoulder is well located.  There is some calcifications around the greater tuberosity    PMFS History: Patient Active Problem List   Diagnosis Date Noted  . Hyperlipidemia 04/12/2018  . Trigger finger 04/11/2018  . Prediabetes 04/09/2018  . Neck pain on right side 05/25/2017  . Osteoporosis 04/24/2016  . Osteoarthritis   . Hypertension   . Colon polyps    Past Medical History:  Diagnosis Date  . Colon polyps 02/2003, 02/2009   Adenomatous polyps  . Hypertension   . Osteoarthritis    hands  . Osteopenia    DEXA 02/2011, 02/2013: -1.9    Family History  Problem  Relation Age of Onset  . Cancer Mother        cancer of saliva glands  . Heart disease Other   . Colon cancer Neg Hx   . Esophageal cancer Neg Hx   . Stomach cancer Neg Hx   . Rectal cancer Neg Hx     Past Surgical History:  Procedure Laterality Date  . BREAST ENHANCEMENT SURGERY  1987  . COLONOSCOPY     Social History   Occupational History  . Occupation: retired  Tobacco Use  . Smoking status: Never Smoker  . Smokeless tobacco: Never Used  Substance and Sexual Activity  . Alcohol use: Yes    Comment: wine with dinner  . Drug use: No  . Sexual activity: Not Currently

## 2019-02-12 DIAGNOSIS — R69 Illness, unspecified: Secondary | ICD-10-CM | POA: Diagnosis not present

## 2019-02-19 ENCOUNTER — Encounter: Payer: Self-pay | Admitting: Gastroenterology

## 2019-03-13 DIAGNOSIS — H52223 Regular astigmatism, bilateral: Secondary | ICD-10-CM | POA: Diagnosis not present

## 2019-03-19 DIAGNOSIS — R69 Illness, unspecified: Secondary | ICD-10-CM | POA: Diagnosis not present

## 2019-03-21 DIAGNOSIS — Z1231 Encounter for screening mammogram for malignant neoplasm of breast: Secondary | ICD-10-CM | POA: Diagnosis not present

## 2019-03-21 DIAGNOSIS — Z0000 Encounter for general adult medical examination without abnormal findings: Secondary | ICD-10-CM | POA: Diagnosis not present

## 2019-03-21 DIAGNOSIS — Z01419 Encounter for gynecological examination (general) (routine) without abnormal findings: Secondary | ICD-10-CM | POA: Diagnosis not present

## 2019-03-21 LAB — HM MAMMOGRAPHY

## 2019-03-26 ENCOUNTER — Telehealth: Payer: Self-pay

## 2019-03-26 DIAGNOSIS — I10 Essential (primary) hypertension: Secondary | ICD-10-CM

## 2019-03-26 DIAGNOSIS — R7303 Prediabetes: Secondary | ICD-10-CM

## 2019-03-26 DIAGNOSIS — E7849 Other hyperlipidemia: Secondary | ICD-10-CM

## 2019-03-26 DIAGNOSIS — Z0000 Encounter for general adult medical examination without abnormal findings: Secondary | ICD-10-CM

## 2019-03-26 NOTE — Telephone Encounter (Signed)
Copied from [place] [id number]. Topic: General - Other >> Mar 26, 2019 10:14 AM Keene Breath wrote: Reason for CRM: Patient would like to come in the week before her physical to have her blood work drawn so that the doctor can go over it at her appt. On 04/13/19.  Please advise and call back to schedule at [phone]

## 2019-03-27 NOTE — Telephone Encounter (Signed)
Blood work ordered.  Please schedule

## 2019-03-27 NOTE — Telephone Encounter (Signed)
Appointment scheduled.

## 2019-04-05 ENCOUNTER — Other Ambulatory Visit (INDEPENDENT_AMBULATORY_CARE_PROVIDER_SITE_OTHER): Payer: Medicare HMO

## 2019-04-05 ENCOUNTER — Other Ambulatory Visit: Payer: Self-pay

## 2019-04-05 DIAGNOSIS — I10 Essential (primary) hypertension: Secondary | ICD-10-CM | POA: Diagnosis not present

## 2019-04-05 DIAGNOSIS — E7849 Other hyperlipidemia: Secondary | ICD-10-CM

## 2019-04-05 DIAGNOSIS — R7303 Prediabetes: Secondary | ICD-10-CM

## 2019-04-05 DIAGNOSIS — Z0000 Encounter for general adult medical examination without abnormal findings: Secondary | ICD-10-CM | POA: Diagnosis not present

## 2019-04-05 LAB — CBC WITH DIFFERENTIAL/PLATELET
Basophils Absolute: 0.1 10*3/uL (ref 0.0–0.1)
Basophils Relative: 0.8 % (ref 0.0–3.0)
Eosinophils Absolute: 0.1 10*3/uL (ref 0.0–0.7)
Eosinophils Relative: 1.9 % (ref 0.0–5.0)
HCT: 42.2 % (ref 36.0–46.0)
Hemoglobin: 14.2 g/dL (ref 12.0–15.0)
Lymphocytes Relative: 37.5 % (ref 12.0–46.0)
Lymphs Abs: 2.3 10*3/uL (ref 0.7–4.0)
MCHC: 33.6 g/dL (ref 30.0–36.0)
MCV: 92.2 fl (ref 78.0–100.0)
Monocytes Absolute: 0.7 10*3/uL (ref 0.1–1.0)
Monocytes Relative: 11.5 % (ref 3.0–12.0)
Neutro Abs: 2.9 10*3/uL (ref 1.4–7.7)
Neutrophils Relative %: 48.3 % (ref 43.0–77.0)
Platelets: 298 10*3/uL (ref 150.0–400.0)
RBC: 4.58 Mil/uL (ref 3.87–5.11)
RDW: 13.8 % (ref 11.5–15.5)
WBC: 6.1 10*3/uL (ref 4.0–10.5)

## 2019-04-05 LAB — LIPID PANEL
Cholesterol: 208 mg/dL — ABNORMAL HIGH (ref 0–200)
HDL: 79.7 mg/dL (ref >39.00)
LDL Cholesterol: 111 mg/dL — ABNORMAL HIGH (ref 0–99)
NonHDL: 128.32
Total CHOL/HDL Ratio: 3
Triglycerides: 89 mg/dL (ref 0.0–149.0)
VLDL: 17.8 mg/dL (ref 0.0–40.0)

## 2019-04-05 LAB — COMPREHENSIVE METABOLIC PANEL
ALT: 14 U/L (ref 0–35)
AST: 16 U/L (ref 0–37)
Albumin: 4.4 g/dL (ref 3.5–5.2)
Alkaline Phosphatase: 73 U/L (ref 39–117)
BUN: 13 mg/dL (ref 6–23)
CO2: 30 mEq/L (ref 19–32)
Calcium: 9.7 mg/dL (ref 8.4–10.5)
Chloride: 102 mEq/L (ref 96–112)
Creatinine, Ser: 0.63 mg/dL (ref 0.40–1.20)
GFR: 92.46 mL/min (ref >60.00)
Glucose, Bld: 116 mg/dL — ABNORMAL HIGH (ref 70–99)
Potassium: 3.7 mEq/L (ref 3.5–5.1)
Sodium: 138 mEq/L (ref 135–145)
Total Bilirubin: 0.8 mg/dL (ref 0.2–1.2)
Total Protein: 7.4 g/dL (ref 6.0–8.3)

## 2019-04-05 LAB — TSH: TSH: 0.81 u[IU]/mL (ref 0.35–4.50)

## 2019-04-05 LAB — HEMOGLOBIN A1C: Hgb A1c MFr Bld: 6.2 % (ref 4.6–6.5)

## 2019-04-12 NOTE — Progress Notes (Signed)
Subjective:    Patient ID: Sherry Mitchell, female    DOB: 12-19-1945, 74 y.o.   MRN: [id number]  HPI She is here for a physical exam.   She denies any major changes in her health since she was here last.  Overall she feels well.  She is taking her medication as prescribed.  Medications and allergies reviewed with patient and updated if appropriate.  Patient Active Problem List   Diagnosis Date Noted  . Hyperlipidemia 04/12/2018  . Trigger finger 04/11/2018  . Prediabetes 04/09/2018  . Neck pain on right side 05/25/2017  . Osteoporosis 04/24/2016  . Osteoarthritis   . Hypertension   . Colon polyps     Current Outpatient Medications on File Prior to Visit  Medication Sig Dispense Refill  . Calcium Carbonate-Vitamin D (CALCIUM-D PO) Take 1 tablet by mouth.    . Cholecalciferol (VITAMIN D3) 50 MCG (2000 UT) TABS Take 2,000 Units by mouth daily.    [name] diltiazem (DILT-XR) 240 MG 24 hr capsule Take 1 capsule (240 mg total) by mouth daily. 90 capsule 0  . lisinopril-hydrochlorothiazide (ZESTORETIC) 20-25 MG tablet Take 1 tablet by mouth daily. 90 tablet 0  . Multiple Vitamin (MULTIVITAMIN) tablet Take 1 tablet by mouth daily.     No current facility-administered medications on file prior to visit.    Past Medical History:  Diagnosis Date  . Colon polyps 02/2003, 02/2009   Adenomatous polyps  . Hypertension   . Osteoarthritis    hands  . Osteopenia    DEXA 02/2011, 02/2013: -1.9    Past Surgical History:  Procedure Laterality Date  . BREAST ENHANCEMENT SURGERY  1987  . COLONOSCOPY      Social History   Socioeconomic History  . Marital status: Married    Spouse name: Not on file  . Number of children: 2  . Years of education: Not on file  . Highest education level: Not on file  Occupational History  . Occupation: retired  Tobacco Use  . Smoking status: Never Smoker  . Smokeless tobacco: Never Used  Substance and Sexual Activity  . Alcohol use: Yes   Comment: wine with dinner  . Drug use: No  . Sexual activity: Not Currently  Other Topics Concern  . Not on file  Social History Narrative   Exercises regularly   Social Determinants of Health   Financial Resource Strain:   . Difficulty of Paying Living Expenses: Not on file  Food Insecurity:   . Worried About Charity fundraiser in the Last Year: Not on file  . Ran Out of Food in the Last Year: Not on file  Transportation Needs:   . Lack of Transportation (Medical): Not on file  . Lack of Transportation (Non-Medical): Not on file  Physical Activity:   . Days of Exercise per Week: Not on file  . Minutes of Exercise per Session: Not on file  Stress:   . Feeling of Stress : Not on file  Social Connections:   . Frequency of Communication with Friends and Family: Not on file  . Frequency of Social Gatherings with Friends and Family: Not on file  . Attends Religious Services: Not on file  . Active Member of Clubs or Organizations: Not on file  . Attends Archivist Meetings: Not on file  . Marital Status: Not on file    Family History  Problem Relation Age of Onset  . Cancer Mother  cancer of saliva glands  . Heart disease Other   . Colon cancer Neg Hx   . Esophageal cancer Neg Hx   . Stomach cancer Neg Hx   . Rectal cancer Neg Hx     Review of Systems  Constitutional: Negative for chills and fever.  Eyes: Negative for visual disturbance.  Respiratory: Negative for cough, shortness of breath and wheezing.   Cardiovascular: Negative for chest pain, palpitations and leg swelling.  Gastrointestinal: Negative for abdominal pain, blood in stool, constipation, diarrhea and nausea.       No gerd  Genitourinary: Negative for dysuria and hematuria.  Musculoskeletal: Positive for arthralgias (trigger finger, hands). Negative for back pain.  Skin: Negative for color change and rash.  Neurological: Positive for dizziness (occ, lasts 2 sec) and light-headedness (occ,  2 sec). Negative for headaches.  Psychiatric/Behavioral: Negative for dysphoric mood. The patient is not nervous/anxious.        Objective:   Vitals:   04/13/19 1051  BP: 136/78  Pulse: 62  Resp: 16  Temp: 98.6 F (37 C)  SpO2: 99%   Filed Weights   04/13/19 1051  Weight: 156 lb 12.8 oz (71.1 kg)   Body mass index is 24.56 kg/m.  BP Readings from Last 3 Encounters:  04/13/19 136/78  04/11/18 138/74  11/24/17 128/74    Wt Readings from Last 3 Encounters:  04/13/19 156 lb 12.8 oz (71.1 kg)  04/11/18 156 lb 6.4 oz (70.9 kg)  11/24/17 152 lb (68.9 kg)     Physical Exam Constitutional: She appears well-developed and well-nourished. No distress.  HENT:  Head: Normocephalic and atraumatic.  Right Ear: External ear normal. Normal ear canal and TM Left Ear: External ear normal.  Normal ear canal and TM Mouth/Throat: Oropharynx is clear and moist.  Eyes: Conjunctivae and EOM are normal.  Neck: Neck supple. No tracheal deviation present. No thyromegaly present.  No carotid bruit  Cardiovascular: Normal rate, regular rhythm and normal heart sounds.   No murmur heard.  No edema. Pulmonary/Chest: Effort normal and breath sounds normal. No respiratory distress. She has no wheezes. She has no rales.  Breast: deferred   Abdominal: Soft. She exhibits no distension. There is no tenderness.  Lymphadenopathy: She has no cervical adenopathy.  Skin: Skin is warm and dry. She is not diaphoretic.  Psychiatric: She has a normal mood and affect. Her behavior is normal.        Assessment & Plan:   Physical exam: Screening blood work    reviewed Immunizations  Discussed covid vaccines, all others up to date Colonoscopy  Up to date   Mammogram   Up to date - done in 02/2019  Gyn  - Dr Benjie Karvonen Dexa    Up to date  Eye exams  Up to date  Exercise  Walking a few times a week   Weight   Normal BMI Substance abuse  None Sees derm annually  See Problem List for Assessment and Plan of  chronic medical problems.   This visit occurred during the SARS-CoV-2 public health emergency.  Safety protocols were in place, including screening questions prior to the visit, additional usage of staff PPE, and extensive cleaning of exam room while observing appropriate contact time as indicated for disinfecting solutions.     Follow-up in 1 year

## 2019-04-12 NOTE — Patient Instructions (Addendum)
All other Health Maintenance issues reviewed.   All recommended immunizations and age-appropriate screenings are up-to-date or discussed.  No immunization administered today.   Medications reviewed and updated.  Changes include :   Start fosamax once weekly.  Your prescription(s) have been submitted to your pharmacy. Please take as directed and contact our office if you believe you are having problem(s) with the medication(s).     Please followup in 1 year    Health Maintenance, Female Adopting a healthy lifestyle and getting preventive care are important in promoting health and wellness. Ask your health care provider about:  The right schedule for you to have regular tests and exams.  Things you can do on your own to prevent diseases and keep yourself healthy. What should I know about diet, weight, and exercise? Eat a healthy diet   Eat a diet that includes plenty of vegetables, fruits, low-fat dairy products, and lean protein.  Do not eat a lot of foods that are high in solid fats, added sugars, or sodium. Maintain a healthy weight Body mass index (BMI) is used to identify weight problems. It estimates body fat based on height and weight. Your health care provider can help determine your BMI and help you achieve or maintain a healthy weight. Get regular exercise Get regular exercise. This is one of the most important things you can do for your health. Most adults should:  Exercise for at least 150 minutes each week. The exercise should increase your heart rate and make you sweat (moderate-intensity exercise).  Do strengthening exercises at least twice a week. This is in addition to the moderate-intensity exercise.  Spend less time sitting. Even light physical activity can be beneficial. Watch cholesterol and blood lipids Have your blood tested for lipids and cholesterol at 74 years of age, then have this test every 5 years. Have your cholesterol levels checked more often  if:  Your lipid or cholesterol levels are high.  You are older than 74 years of age.  You are at high risk for heart disease. What should I know about cancer screening? Depending on your health history and family history, you may need to have cancer screening at various ages. This may include screening for:  Breast cancer.  Cervical cancer.  Colorectal cancer.  Skin cancer.  Lung cancer. What should I know about heart disease, diabetes, and high blood pressure? Blood pressure and heart disease  High blood pressure causes heart disease and increases the risk of stroke. This is more likely to develop in people who have high blood pressure readings, are of African descent, or are overweight.  Have your blood pressure checked: ? Every 3-5 years if you are 14-23 years of age. ? Every year if you are 33 years old or older. Diabetes Have regular diabetes screenings. This checks your fasting blood sugar level. Have the screening done:  Once every three years after age 32 if you are at a normal weight and have a low risk for diabetes.  More often and at a younger age if you are overweight or have a high risk for diabetes. What should I know about preventing infection? Hepatitis B If you have a higher risk for hepatitis B, you should be screened for this virus. Talk with your health care provider to find out if you are at risk for hepatitis B infection. Hepatitis C Testing is recommended for:  Everyone born from [birth year] through 1965.  Anyone with known risk factors for hepatitis C. Sexually transmitted  infections (STIs)  Get screened for STIs, including gonorrhea and chlamydia, if: ? You are sexually active and are younger than 74 years of age. ? You are older than 73 years of age and your health care provider tells you that you are at risk for this type of infection. ? Your sexual activity has changed since you were last screened, and you are at increased risk for chlamydia or  gonorrhea. Ask your health care provider if you are at risk.  Ask your health care provider about whether you are at high risk for HIV. Your health care provider may recommend a prescription medicine to help prevent HIV infection. If you choose to take medicine to prevent HIV, you should first get tested for HIV. You should then be tested every 3 months for as long as you are taking the medicine. Pregnancy  If you are about to stop having your period (premenopausal) and you may become pregnant, seek counseling before you get pregnant.  Take 400 to 800 micrograms (mcg) of folic acid every day if you become pregnant.  Ask for birth control (contraception) if you want to prevent pregnancy. Osteoporosis and menopause Osteoporosis is a disease in which the bones lose minerals and strength with aging. This can result in bone fractures. If you are 75 years old or older, or if you are at risk for osteoporosis and fractures, ask your health care provider if you should:  Be screened for bone loss.  Take a calcium or vitamin D supplement to lower your risk of fractures.  Be given hormone replacement therapy (HRT) to treat symptoms of menopause. Follow these instructions at home: Lifestyle  Do not use any products that contain nicotine or tobacco, such as cigarettes, e-cigarettes, and chewing tobacco. If you need help quitting, ask your health care provider.  Do not use street drugs.  Do not share needles.  Ask your health care provider for help if you need support or information about quitting drugs. Alcohol use  Do not drink alcohol if: ? Your health care provider tells you not to drink. ? You are pregnant, may be pregnant, or are planning to become pregnant.  If you drink alcohol: ? Limit how much you use to 0-1 drink a day. ? Limit intake if you are breastfeeding.  Be aware of how much alcohol is in your drink. In the [place] one drink equals one 12 oz bottle of beer (355 mL), one 5 oz  glass of wine (148 mL), or one 1 oz glass of hard liquor (44 mL). General instructions  Schedule regular health, dental, and eye exams.  Stay current with your vaccines.  Tell your health care provider if: ? You often feel depressed. ? You have ever been abused or do not feel safe at home. Summary  Adopting a healthy lifestyle and getting preventive care are important in promoting health and wellness.  Follow your health care provider's instructions about healthy diet, exercising, and getting tested or screened for diseases.  Follow your health care provider's instructions on monitoring your cholesterol and blood pressure. This information is not intended to replace advice given to you by your health care provider. Make sure you discuss any questions you have with your health care provider. Document Revised: 03/01/2018 Document Reviewed: 03/01/2018 Elsevier Patient Education  2020 [employer].

## 2019-04-12 NOTE — Assessment & Plan Note (Addendum)
Chronic Cholesterol improved compared to last year and looks well controlled Continue regular exercise ASCVD risk slightly elevated.  Given her healthy lifestyle in good control of everything we have decided to hold off on medication She will continue to control her cholesterol with lifestyle

## 2019-04-13 ENCOUNTER — Other Ambulatory Visit: Payer: Self-pay

## 2019-04-13 ENCOUNTER — Encounter: Payer: Self-pay | Admitting: Internal Medicine

## 2019-04-13 ENCOUNTER — Ambulatory Visit (INDEPENDENT_AMBULATORY_CARE_PROVIDER_SITE_OTHER): Payer: Medicare HMO | Admitting: Internal Medicine

## 2019-04-13 VITALS — BP 136/78 | HR 62 | Temp 98.6°F | Resp 16 | Ht 67.0 in | Wt 156.8 lb

## 2019-04-13 DIAGNOSIS — E7849 Other hyperlipidemia: Secondary | ICD-10-CM

## 2019-04-13 DIAGNOSIS — M810 Age-related osteoporosis without current pathological fracture: Secondary | ICD-10-CM

## 2019-04-13 DIAGNOSIS — R7303 Prediabetes: Secondary | ICD-10-CM

## 2019-04-13 DIAGNOSIS — Z0000 Encounter for general adult medical examination without abnormal findings: Secondary | ICD-10-CM | POA: Diagnosis not present

## 2019-04-13 DIAGNOSIS — I10 Essential (primary) hypertension: Secondary | ICD-10-CM

## 2019-04-13 MED ORDER — LISINOPRIL-HYDROCHLOROTHIAZIDE 20-25 MG PO TABS: 1.0000 | ORAL_TABLET | Freq: Every day | ORAL | 3 refills | Status: DC

## 2019-04-13 MED ORDER — DILTIAZEM HCL ER 240 MG PO CP24: 240.0000 mg | ORAL_CAPSULE | Freq: Every day | ORAL | 3 refills | Status: DC

## 2019-04-13 MED ORDER — ALENDRONATE SODIUM 70 MG PO TABS: 70.0000 mg | ORAL_TABLET | ORAL | 3 refills | Status: DC

## 2019-04-13 NOTE — Assessment & Plan Note (Signed)
Chronic Reviewed bone density We both feel she would benefit from medication and she would like to try an oral bisphosphonate Start Fosamax weekly Continue daily vitamin D Calcium does cause some constipation, but she will try to compensate with some Metamucil and increasing in her diet Continue regular exercise

## 2019-04-13 NOTE — Assessment & Plan Note (Addendum)
Chronic A1c 6.2% Low sugar / carb diet-she states she could decrease carbohydrates, but does well with sugars Continue regular exercise

## 2019-04-13 NOTE — Assessment & Plan Note (Signed)
Chronic BP well controlled Current regimen effective and well tolerated Continue current medications at current doses cmp  

## 2019-04-16 ENCOUNTER — Ambulatory Visit (INDEPENDENT_AMBULATORY_CARE_PROVIDER_SITE_OTHER): Payer: Medicare HMO | Admitting: Internal Medicine

## 2019-04-16 ENCOUNTER — Encounter: Payer: Self-pay | Admitting: Internal Medicine

## 2019-04-16 ENCOUNTER — Other Ambulatory Visit: Payer: Self-pay

## 2019-04-16 DIAGNOSIS — R0789 Other chest pain: Secondary | ICD-10-CM | POA: Diagnosis not present

## 2019-04-16 MED ORDER — FAMOTIDINE 40 MG PO TABS: 40.0000 mg | ORAL_TABLET | Freq: Every evening | ORAL | 1 refills | Status: DC

## 2019-04-16 NOTE — Patient Instructions (Addendum)
An EKG was done today.   A referral was ordered for cardiology.   Start pepcid 40 mg in the evening

## 2019-04-16 NOTE — Assessment & Plan Note (Signed)
Acute, atypical in nature Started 2 days ago-lower sternum, intermittent and occurring at rest and not with activity EKG today shows sinus rhythm with sinus arrhythmia at 73 bpm, nonspecific ST abnormality.  Compared to previous EKG from 2019 nonspecific ST abnormality is stable Possible GERD versus less likely cardiac in nature Start Pepcid 40 mg in evening Will refer to cardiology She has not started taking the Fosamax and advised her not to start until after seeing cardiology

## 2019-04-16 NOTE — Progress Notes (Signed)
Subjective:    Patient ID: Sherry Mitchell, female    DOB: 28-Nov-1945, 74 y.o.   MRN: [id number]  HPI The patient is here for an acute visit for chest tightness.   Yesterday morning she woke up very early with tightness in her lower central sternum.  She got up and went to the recliner and stayed there the rest of the night.  Yesterday she had no symptoms and did fine throughout the day.  Last night when she went to sleep she started to experience the tightness again and immediately just went to the recliner.  She felt it all night.  She took a couple of Tums around 2:30 in the morning, but vomited them up around 3:30 am.  This morning when she got up she felt fine.  In the late morning she started to feel some of the tightness again and while she is sitting here now she has mild tightness in the lower portion of her sternum.  She denies any burning sensation.  She has never had heartburn/reflux.  There is no radiation of the pain.  There is no associated symptoms.   I just recently prescribed her Fosamax, but she has not started taking it yet.    Medications and allergies reviewed with patient and updated if appropriate.  Patient Active Problem List   Diagnosis Date Noted  . Chest tightness 04/16/2019  . Hyperlipidemia 04/12/2018  . Trigger finger 04/11/2018  . Prediabetes 04/09/2018  . Neck pain on right side 05/25/2017  . Osteoporosis 04/24/2016  . Osteoarthritis   . Hypertension   . Colon polyps     Current Outpatient Medications on File Prior to Visit  Medication Sig Dispense Refill  . alendronate (FOSAMAX) 70 MG tablet Take 1 tablet (70 mg total) by mouth every 7 (seven) days. Take with a full glass of water on an empty stomach. 12 tablet 3  . Calcium Carbonate-Vitamin D (CALCIUM-D PO) Take 1 tablet by mouth.    . Cholecalciferol (VITAMIN D3) 50 MCG (2000 UT) TABS Take 2,000 Units by mouth daily.    [name] diltiazem (DILT-XR) 240 MG 24 hr capsule Take 1 capsule (240 mg total)  by mouth daily. 90 capsule 3  . lisinopril-hydrochlorothiazide (ZESTORETIC) 20-25 MG tablet Take 1 tablet by mouth daily. 90 tablet 3  . Multiple Vitamin (MULTIVITAMIN) tablet Take 1 tablet by mouth daily.     No current facility-administered medications on file prior to visit.    Past Medical History:  Diagnosis Date  . Colon polyps 02/2003, 02/2009   Adenomatous polyps  . Hypertension   . Osteoarthritis    hands  . Osteopenia    DEXA 02/2011, 02/2013: -1.9    Past Surgical History:  Procedure Laterality Date  . BREAST ENHANCEMENT SURGERY  1987  . COLONOSCOPY      Social History   Socioeconomic History  . Marital status: Married    Spouse name: Not on file  . Number of children: 2  . Years of education: Not on file  . Highest education level: Not on file  Occupational History  . Occupation: retired  Tobacco Use  . Smoking status: Never Smoker  . Smokeless tobacco: Never Used  Substance and Sexual Activity  . Alcohol use: Yes    Comment: wine with dinner  . Drug use: No  . Sexual activity: Not Currently  Other Topics Concern  . Not on file  Social History Narrative   Exercises regularly   Social Determinants of  Health   Financial Resource Strain:   . Difficulty of Paying Living Expenses: Not on file  Food Insecurity:   . Worried About Charity fundraiser in the Last Year: Not on file  . Ran Out of Food in the Last Year: Not on file  Transportation Needs:   . Lack of Transportation (Medical): Not on file  . Lack of Transportation (Non-Medical): Not on file  Physical Activity:   . Days of Exercise per Week: Not on file  . Minutes of Exercise per Session: Not on file  Stress:   . Feeling of Stress : Not on file  Social Connections:   . Frequency of Communication with Friends and Family: Not on file  . Frequency of Social Gatherings with Friends and Family: Not on file  . Attends Religious Services: Not on file  . Active Member of Clubs or Organizations:  Not on file  . Attends Archivist Meetings: Not on file  . Marital Status: Not on file    Family History  Problem Relation Age of Onset  . Cancer Mother        cancer of saliva glands  . Heart disease Other   . Colon cancer Neg Hx   . Esophageal cancer Neg Hx   . Stomach cancer Neg Hx   . Rectal cancer Neg Hx     Review of Systems  Constitutional: Negative for chills and fever.  Respiratory: Negative for cough, shortness of breath and wheezing.   Cardiovascular: Positive for chest pain. Negative for palpitations and leg swelling.  Gastrointestinal: Positive for vomiting (x 1). Negative for abdominal pain and nausea.  Neurological: Positive for dizziness (occ) and light-headedness (occ). Negative for headaches.       Objective:   Vitals:   04/16/19 1340  BP: (!) 160/72  Pulse: 78  Resp: 16  Temp: 98.7 F (37.1 C)  SpO2: 98%   BP Readings from Last 3 Encounters:  04/16/19 (!) 160/72  04/13/19 136/78  04/11/18 138/74   Wt Readings from Last 3 Encounters:  04/13/19 156 lb 12.8 oz (71.1 kg)  04/11/18 156 lb 6.4 oz (70.9 kg)  11/24/17 152 lb (68.9 kg)   Body mass index is 24.56 kg/m.   Physical Exam    Constitutional: Appears well-developed and well-nourished. No distress.  Head: Normocephalic and atraumatic.  Neck: Neck supple. No tracheal deviation present. No thyromegaly present.  No cervical lymphadenopathy Cardiovascular: Normal rate, regular rhythm and normal heart sounds.  No murmur heard. No carotid bruit .  No edema Pulmonary/Chest: Sternum nontender to palpation.  Effort normal and breath sounds normal. No respiratory distress. No has no wheezes. No rales.  Skin: Skin is warm and dry. Not diaphoretic.  Psychiatric: Normal mood and affect. Behavior is normal.       Assessment & Plan:    See Problem List for Assessment and Plan of chronic medical problems.     This visit occurred during the SARS-CoV-2 public health emergency.  Safety  protocols were in place, including screening questions prior to the visit, additional usage of staff PPE, and extensive cleaning of exam room while observing appropriate contact time as indicated for disinfecting solutions.

## 2019-04-19 ENCOUNTER — Ambulatory Visit: Payer: Medicare HMO | Admitting: Cardiovascular Disease

## 2019-04-19 ENCOUNTER — Other Ambulatory Visit: Payer: Self-pay

## 2019-04-19 DIAGNOSIS — I10 Essential (primary) hypertension: Secondary | ICD-10-CM

## 2019-04-19 DIAGNOSIS — R072 Precordial pain: Secondary | ICD-10-CM

## 2019-04-19 DIAGNOSIS — M810 Age-related osteoporosis without current pathological fracture: Secondary | ICD-10-CM | POA: Diagnosis not present

## 2019-04-19 DIAGNOSIS — R011 Cardiac murmur, unspecified: Secondary | ICD-10-CM | POA: Diagnosis not present

## 2019-04-19 DIAGNOSIS — E785 Hyperlipidemia, unspecified: Secondary | ICD-10-CM

## 2019-04-19 NOTE — Patient Instructions (Signed)
Medication Instructions:  NO CHANGES *If you need a refill on your cardiac medications before your next appointment, please call your pharmacy*  Lab Work: NONE If you have labs (blood work) drawn today and your tests are completely normal, you will receive your results only by: [name] MyChart Message (if you have MyChart) OR . A paper copy in the mail If you have any lab test that is abnormal or we need to change your treatment, we will call you to review the results.  Testing/Procedures: Your physician has requested that you have an echocardiogram. Echocardiography is a painless test that uses sound waves to create images of your heart. It provides your doctor with information about the size and shape of your heart and how well your heart's chambers and valves are working. This procedure takes approximately one hour. There are no restrictions for this procedure.  [place] CT CALCIUM SCORE - DONE AT [facility] [address] Follow-Up: AFTER THE ECHO AND CARDIAC CT

## 2019-04-19 NOTE — Progress Notes (Signed)
Cardiology Office Note    Date:  04/21/2019   ID:  Sherry Mitchell, DOB 1945/06/19, MRN [id number]  PCP:  Binnie Rail, MD  Cardiologist:  Shelva Majestic, MD   New cardiology evaluation referred through the courtesy of Dr. Billey Gosling for evaluation of chest discomfort.  History of Present Illness:  Sherry Mitchell is a 74 y.o. female who is the mother of Sherry Mitchell.  She has recently developed several episodes of lower central sternal chest discomfort and is referred for cardiology evaluation.  This Brocksmith admits to a 10-year history of hypertension and most recently has been on lisinopril HCT 20/12.5 in addition to diltiazem 240 mg daily.  She has a history of osteoporosis and remotely had been on Evista and was recently given a prescription for alendronate.  She has remained active and denies any exertional chest pain symptomatology or changing exertional shortness of breath.  Last Saturday morning at 3 AM, she was awakened from sleep with a tightness in her lower sternal region.  She got up and went to her recliner.  The pain did not radiate it was not associated with shortness of breath or diaphoresis and ultimately resolved.  Sunday evening, after going to bed she again experienced an episode of this lower sternal discomfort.  It was somewhat relieved with Tums.  However approximately 1 hour later she vomited.  She was seen the following day by Billey Gosling and while sitting also still experience a mild tightness in the lower portion of her sternum.  There was no pain radiation there was no shortness of breath.  Her ECG was normal without significant ST abnormality.  She is now referred for cardiology evaluation.  Upon further questioning, she states that she sleeps well.  Her sleep is restorative.  She denies any exertional chest pain symptomatology.  She denies any exertional dyspnea.  She initially was advised to take Pepcid and for the past several days while taking Pepcid she  denies any recurrent symptomatology.   Past Medical History:  Diagnosis Date  . Colon polyps 02/2003, 02/2009   Adenomatous polyps  . Hypertension   . Osteoarthritis    hands  . Osteopenia    DEXA 02/2011, 02/2013: -1.9    Past Surgical History:  Procedure Laterality Date  . BREAST ENHANCEMENT SURGERY  1987  . COLONOSCOPY      Current Medications: Outpatient Medications Prior to Visit  Medication Sig Dispense Refill  . alendronate (FOSAMAX) 70 MG tablet Take 1 tablet (70 mg total) by mouth every 7 (seven) days. Take with a full glass of water on an empty stomach. 12 tablet 3  . Calcium Carbonate-Vitamin D (CALCIUM-D PO) Take 1 tablet by mouth.    . Cholecalciferol (VITAMIN D3) 50 MCG (2000 UT) TABS Take 2,000 Units by mouth daily.    [name] diltiazem (DILT-XR) 240 MG 24 hr capsule Take 1 capsule (240 mg total) by mouth daily. 90 capsule 3  . famotidine (PEPCID) 40 MG tablet Take 1 tablet (40 mg total) by mouth every evening. 30 tablet 1  . lisinopril-hydrochlorothiazide (ZESTORETIC) 20-25 MG tablet Take 1 tablet by mouth daily. 90 tablet 3  . Multiple Vitamin (MULTIVITAMIN) tablet Take 1 tablet by mouth daily.     No facility-administered medications prior to visit.     Allergies:   Patient has no known allergies.   Social History   Socioeconomic History  . Marital status: Married    Spouse name: Not on file  . Number  of children: 2  . Years of education: Not on file  . Highest education level: Not on file  Occupational History  . Occupation: retired  Tobacco Use  . Smoking status: Never Smoker  . Smokeless tobacco: Never Used  Substance and Sexual Activity  . Alcohol use: Yes    Comment: wine with dinner  . Drug use: No  . Sexual activity: Not Currently  Other Topics Concern  . Not on file  Social History Narrative   Exercises regularly   Social Determinants of Health   Financial Resource Strain:   . Difficulty of Paying Living Expenses: Not on file  Food  Insecurity:   . Worried About Charity fundraiser in the Last Year: Not on file  . Ran Out of Food in the Last Year: Not on file  Transportation Needs:   . Lack of Transportation (Medical): Not on file  . Lack of Transportation (Non-Medical): Not on file  Physical Activity:   . Days of Exercise per Week: Not on file  . Minutes of Exercise per Session: Not on file  Stress:   . Feeling of Stress : Not on file  Social Connections:   . Frequency of Communication with Friends and Family: Not on file  . Frequency of Social Gatherings with Friends and Family: Not on file  . Attends Religious Services: Not on file  . Active Member of Clubs or Organizations: Not on file  . Attends Archivist Meetings: Not on file  . Marital Status: Not on file    Socially, she is in her second marriage of 23 years.  Unfortunately, her first husband died at age 56.  She has 2 children with her first husband including Sherry Mitchell, age 65 who works for [place] at [employer] and Sherry Mitchell age 63.  She has 2 grandchildren. There is no tobacco use.  She exercises regularly and either walks or rides a stationary bike 3 to 4 days/week for up to 30 to 40 minutes.  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  Family History:  The patient's family history includes Cancer in her mother; Heart disease in an other family member.  Her mother died at age 16 with cancer of her salivary glands.  Her father died at age 35 and had a history of rheumatic fever and may have suffered a heart attack.  She has 1 brother age 97 with dementia.  ROS General: Negative; No fevers, chills, or night sweats;  HEENT: Negative; No changes in vision or hearing, sinus congestion, difficulty swallowing Pulmonary: Negative; No cough, wheezing, shortness of breath, hemoptysis Cardiovascular: Negative; No chest pain, presyncope, syncope, palpitations GI: Negative; No nausea, vomiting, diarrhea, or abdominal pain GU: Negative; No  dysuria, hematuria, or difficulty voiding Musculoskeletal: Negative; no myalgias, joint pain, or weakness Hematologic/Oncology: Negative; no easy bruising, bleeding Endocrine: Negative; no heat/cold intolerance; no diabetes Neuro: Negative; no changes in balance, headaches Skin: Negative; No rashes or skin lesions Psychiatric: Negative; No behavioral problems, depression Sleep: Negative; No snoring, daytime sleepiness, hypersomnolence, bruxism, restless legs, hypnogognic hallucinations, no cataplexy Other comprehensive 14 point system review is negative.   PHYSICAL EXAM:   VS:  BP 136/68   Pulse 72   Temp (!) 97.4 F (36.3 C)   Ht '5\' 7"'  (1.702 m)   Wt 152 lb 9.6 oz (69.2 kg)   BMI 23.90 kg/m     Repeat blood pressure by me was 122/70  Wt Readings from Last 3  Encounters:  04/19/19 152 lb 9.6 oz (69.2 kg)  04/13/19 156 lb 12.8 oz (71.1 kg)  04/11/18 156 lb 6.4 oz (70.9 kg)    General: Alert, oriented, no distress.  Skin: normal turgor, no rashes, warm and dry HEENT: Normocephalic, atraumatic. Pupils equal round and reactive to light; sclera anicteric; extraocular muscles intact;  Nose without nasal septal hypertrophy Mouth/Parynx benign; Mallinpatti scale 2 Neck: No JVD, no carotid bruits; normal carotid upstroke Lungs: clear to ausculatation and percussion; no wheezing or rales Chest wall: without tenderness to palpitation Heart: PMI not displaced, RRR, s1 s2 normal, 1/6 systolic murmur along the left sternal border;  no diastolic murmur, no rubs, gallops, thrills, or heaves Abdomen: soft, nontender; no hepatosplenomehaly, BS+; abdominal aorta nontender and not dilated by palpation. Back: no CVA tenderness Pulses 2+ Musculoskeletal: full range of motion, normal strength, no joint deformities Extremities: no clubbing cyanosis or edema, Homan's sign negative  Neurologic: grossly nonfocal; Cranial nerves grossly wnl Psychologic: Normal mood and affect   Studies/Labs  Reviewed:   EKG:  EKG isordered today.  ECG (independently read by me): Normal sinus rhythm at 72 bpm with mild sinus arrhythmia.  No ectopy.  Normal intervals  Recent Labs: BMP Latest Ref Rng & Units 04/05/2019 04/11/2018 04/04/2017  Glucose 70 - 99 mg/dL 116(H) 110(H) 106(H)  BUN 6 - 23 mg/dL '13 16 12  ' Creatinine 0.40 - 1.20 mg/dL 0.63 0.65 0.60  Sodium 135 - 145 mEq/L 138 138 140  Potassium 3.5 - 5.1 mEq/L 3.7 3.8 3.6  Chloride 96 - 112 mEq/L 102 100 103  CO2 19 - 32 mEq/L '30 30 29  ' Calcium 8.4 - 10.5 mg/dL 9.7 10.4 9.9     Hepatic Function Latest Ref Rng & Units 04/05/2019 04/11/2018 04/04/2017  Total Protein 6.0 - 8.3 g/dL 7.4 7.7 7.2  Albumin 3.5 - 5.2 g/dL 4.4 4.5 4.2  AST 0 - 37 U/L '16 18 11  ' ALT 0 - 35 U/L '14 14 10  ' Alk Phosphatase 39 - 117 U/L 73 72 66  Total Bilirubin 0.2 - 1.2 mg/dL 0.8 0.6 0.7  Bilirubin, Direct 0.1 - 0.5 mg/dL - - -    CBC Latest Ref Rng & Units 04/05/2019 04/11/2018 04/04/2017  WBC 4.0 - 10.5 K/uL 6.1 6.0 5.2  Hemoglobin 12.0 - 15.0 g/dL 14.2 14.9 13.9  Hematocrit 36.0 - 46.0 % 42.2 44.0 42.1  Platelets 150.0 - 400.0 K/uL 298.0 280.0 274.0   Lab Results  Component Value Date   MCV 92.2 04/05/2019   MCV 93.1 04/11/2018   MCV 93.0 04/04/2017   Lab Results  Component Value Date   TSH 0.81 04/05/2019   Lab Results  Component Value Date   HGBA1C 6.2 04/05/2019     BNP No results found for: BNP  ProBNP No results found for: PROBNP   Lipid Panel     Component Value Date/Time   CHOL 208 (H) 04/05/2019 1036   TRIG 89.0 04/05/2019 1036   HDL 79.70 04/05/2019 1036   CHOLHDL 3 04/05/2019 1036   VLDL 17.8 04/05/2019 1036   LDLCALC 111 (H) 04/05/2019 1036   LDLDIRECT 125.1 03/01/2013 1050     RADIOLOGY: No results found.   Additional studies/ records that were reviewed today include:  I reviewed the records of Dr. Billey Gosling  ASSESSMENT:    1. Precordial pain   2. Murmur   3. Essential hypertension   4. Mild hyperlipidemia     5. Age-related osteoporosis without current pathological fracture  PLAN:  Ms. Bibi Economos is a very pleasant young appearing 74 year old female who has a history of hypertension for at least 10 years and most recently has been on medical regimen consisting of lisinopril HCT 20/25 mg in addition to diltiazem XR 240 mg daily.  Her blood pressure today is stable.  She has recently developed episodes of lower sternal chest discomfort and tightness which seem to have occurred in the supine position.  She specifically denies any exertional component to her discomfort or significant pain radiation or associated shortness of breath or diaphoresis.  Since initiating famotidine 40 mg, she has not had any recurrence over the past several days.  I suspect her symptoms are most likely GI mediated with potential esophageal etiology GERD/esophageal spasm.  Her blood pressure today is excellent on her current medical regimen.  She does have a soft systolic murmur along the left sternal border.  I have suggested she undergo an echo Doppler study for further evaluation of systolic and diastolic function as well as valvular architecture.  Most recent lipid studies were reviewed which reveal a total cholesterol of 208 and LDL cholesterol of 111.  HDL cholesterol is 79.  Her ECG remained stable.  I have suggested an initial cardiac screening evaluation and have recommended she undergo cardiac CT calcium score evaluation.  If there is evidence for coronary calcification or atherosclerosis I would recommend lipid-lowering therapy in an attempt to induce plaque regression particularly with her LDL of 111.  Presently, she has no exertional component.  I have recommended she continue her current famotidine.  If symptoms recur, GI evaluation may also be indicated.  I will see her in 6 weeks for follow-up evaluation.   Medication Adjustments/Labs and Tests Ordered: Current medicines are reviewed at length with the patient  today.  Concerns regarding medicines are outlined above.  Medication changes, Labs and Tests ordered today are listed in the Patient Instructions below. Patient Instructions  Medication Instructions:  NO CHANGES *If you need a refill on your cardiac medications before your next appointment, please call your pharmacy*  Lab Work: NONE If you have labs (blood work) drawn today and your tests are completely normal, you will receive your results only by: [name] MyChart Message (if you have MyChart) OR . A paper copy in the mail If you have any lab test that is abnormal or we need to change your treatment, we will call you to review the results.  Testing/Procedures: Your physician has requested that you have an echocardiogram. Echocardiography is a painless test that uses sound waves to create images of your heart. It provides your doctor with information about the size and shape of your heart and how well your heart's chambers and valves are working. This procedure takes approximately one hour. There are no restrictions for this procedure.  [place] CT CALCIUM SCORE - DONE AT [facility] [address] Follow-Up: AFTER THE ECHO AND CARDIAC CT     Signed, Shelva Majestic, MD  04/21/2019 12:41 PM    [facility] [address] [place], [place], [place]  [address] Phone: [phone]

## 2019-04-21 ENCOUNTER — Ambulatory Visit: Payer: Medicare HMO

## 2019-04-21 ENCOUNTER — Encounter: Payer: Self-pay | Admitting: Cardiovascular Disease

## 2019-04-26 ENCOUNTER — Ambulatory Visit: Payer: Medicare HMO | Attending: Internal Medicine

## 2019-04-26 ENCOUNTER — Ambulatory Visit: Payer: Medicare HMO

## 2019-04-26 DIAGNOSIS — Z23 Encounter for immunization: Secondary | ICD-10-CM | POA: Insufficient documentation

## 2019-04-26 NOTE — Progress Notes (Signed)
   Covid-19 Vaccination Clinic  Name:  Sherry Mitchell    MRN: [id number] DOB: 1945-04-03  04/26/2019  [name] was observed post Covid-19 immunization for 15 minutes without incidence. She was provided with Vaccine Information Sheet and instruction to access the V-Safe system.   [name] was instructed to call 911 with any severe reactions post vaccine: [name] Difficulty breathing  . Swelling of your face and throat  . A fast heartbeat  . A bad rash all over your body  . Dizziness and weakness    Immunizations Administered    Name Date Dose VIS Date Route   Pfizer COVID-19 Vaccine 04/26/2019  8:42 AM 0.3 mL 03/02/2019 Intramuscular   Manufacturer: [place]   Lot: [id number]   [place]: [id number]

## 2019-05-01 ENCOUNTER — Ambulatory Visit: Payer: Medicare HMO | Admitting: Cardiology

## 2019-05-04 ENCOUNTER — Ambulatory Visit (INDEPENDENT_AMBULATORY_CARE_PROVIDER_SITE_OTHER)
Admission: RE | Admit: 2019-05-04 | Discharge: 2019-05-04 | Disposition: A | Discharge status: Home / Self Care | Payer: Self-pay | Source: Ambulatory Visit | Attending: Cardiology | Admitting: Cardiology

## 2019-05-04 ENCOUNTER — Ambulatory Visit (HOSPITAL_COMMUNITY): Payer: Medicare HMO | Attending: Internal Medicine

## 2019-05-04 ENCOUNTER — Other Ambulatory Visit: Payer: Self-pay

## 2019-05-04 DIAGNOSIS — R072 Precordial pain: Secondary | ICD-10-CM

## 2019-05-04 DIAGNOSIS — R011 Cardiac murmur, unspecified: Secondary | ICD-10-CM | POA: Diagnosis not present

## 2019-05-21 ENCOUNTER — Ambulatory Visit: Payer: Medicare HMO | Attending: Internal Medicine

## 2019-05-21 DIAGNOSIS — Z23 Encounter for immunization: Secondary | ICD-10-CM | POA: Insufficient documentation

## 2019-05-21 NOTE — Progress Notes (Signed)
   Covid-19 Vaccination Clinic  Name:  Sherry Mitchell    MRN: [id number] DOB: 26-Jan-1946  05/21/2019  Sherry Mitchell was observed post Covid-19 immunization for 15 minutes without incidence. She was provided with Vaccine Information Sheet and instruction to access the V-Safe system.   Sherry Mitchell was instructed to call 911 with any severe reactions post vaccine: [name] Difficulty breathing  . Swelling of your face and throat  . A fast heartbeat  . A bad rash all over your body  . Dizziness and weakness    Immunizations Administered    Name Date Dose VIS Date Route   Pfizer COVID-19 Vaccine 05/21/2019  9:01 AM 0.3 mL 03/02/2019 Intramuscular   Manufacturer: [place]   Lot: [id number]   [place]: [id number]

## 2019-06-07 ENCOUNTER — Encounter: Payer: Self-pay | Admitting: Cardiovascular Disease

## 2019-06-07 ENCOUNTER — Ambulatory Visit: Payer: Medicare HMO | Admitting: Cardiovascular Disease

## 2019-06-07 ENCOUNTER — Other Ambulatory Visit: Payer: Self-pay

## 2019-06-07 DIAGNOSIS — I358 Other nonrheumatic aortic valve disorders: Secondary | ICD-10-CM

## 2019-06-07 DIAGNOSIS — I10 Essential (primary) hypertension: Secondary | ICD-10-CM | POA: Diagnosis not present

## 2019-06-07 DIAGNOSIS — K219 Gastro-esophageal reflux disease without esophagitis: Secondary | ICD-10-CM | POA: Diagnosis not present

## 2019-06-07 DIAGNOSIS — R011 Cardiac murmur, unspecified: Secondary | ICD-10-CM | POA: Diagnosis not present

## 2019-06-07 NOTE — Patient Instructions (Signed)
Medication Instructions:  CONTINUE WITH CURRENT MEDICATIONS. NO CHANGES.  *If you need a refill on your cardiac medications before your next appointment, please call your pharmacy*    Follow-Up: At [facility], you and your health needs are our priority.  As part of our continuing mission to provide you with exceptional heart care, we have created designated Provider Care Teams.  These Care Teams include your primary Cardiologist (physician) and Advanced Practice Providers (APPs -  Physician Assistants and Nurse Practitioners) who all work together to provide you with the care you need, when you need it.  We recommend signing up for the patient portal called "MyChart".  Sign up information is provided on this After Visit Summary.  MyChart is used to connect with patients for Virtual Visits (Telemedicine).  Patients are able to view lab/test results, encounter notes, upcoming appointments, etc.  Non-urgent messages can be sent to your provider as well.   To learn more about what you can do with MyChart, go to [email or web address].    Your next appointment:    as needed with [facility]

## 2019-06-07 NOTE — Progress Notes (Signed)
Cardiology Office Note    Date:  06/07/2019   ID:  Sherry Mitchell, DOB 1945-10-20, MRN [id number]  PCP:  Binnie Rail, MD  Cardiologist:  Shelva Majestic, MD   F/u cardiology evaluation referred through the courtesy of Dr. Billey Gosling for evaluation of chest discomfort.  History of Present Illness:  Sherry Mitchell is a 74 y.o. female who is the mother of Sherry Mitchell.  She had developed several episodes of lower central sternal chest discomfort and was referred for cardiology evaluation.  I saw her for initial evaluation on April 19, 2019.  She presents for follow-up evaluation.  This Loeffler admits to a 10-year history of hypertension and most recently has been on lisinopril HCT 20/12.5 in addition to diltiazem 240 mg daily.  She has a history of osteoporosis and remotely had been on Evista and was recently given a prescription for alendronate.  She has remained active and denies any exertional chest pain symptomatology or changing exertional shortness of breath.  Last Saturday morning at 3 AM, she was awakened from sleep with a tightness in her lower sternal region.  She got up and went to her recliner.  The pain did not radiate it was not associated with shortness of breath or diaphoresis and ultimately resolved.  Sunday evening, after going to bed she again experienced an episode of this lower sternal discomfort.  It was somewhat relieved with Tums.  However approximately 1 hour later she vomited.  She was seen the following day by Billey Gosling and while sitting also still experience a mild tightness in the lower portion of her sternum.  There was no pain radiation there was no shortness of breath.  Her ECG was normal without significant ST abnormality.  She is now referred for cardiology evaluation.  Upon further questioning, she states that she sleeps well.  Her sleep is restorative.  She denies any exertional chest pain symptomatology.  She denies any exertional dyspnea.  She initially  was advised to take Pepcid and for the past several days while taking Pepcid she denies any recurrent symptomatology.  When I initially saw her, I did not feel her chest pain was of ischemic etiology.  I recommended that she undergo a 2D echo Doppler study which was done on May 04, 2019 and showed normal systolic function with EF 60 to 65% with evidence for mildly increased asymmetric left ventricular hypertrophy of the posterior segment.  She had normal diastolic parameters.  There was mild aortic valve sclerosis without stenosis or insufficiency.  Schedule her for a coronary calcium score which revealed a calcium score of 0.  She did not have any significant lung issues.  Since her initial 2 episodes of discomfort, she has not had any recurrence.  She had taken Pepcid for 10 days and has stopped taking this without recurrence.  She denies any exertional shortness of breath or chest pain.  She presents for reevaluation.   Past Medical History:  Diagnosis Date  . Colon polyps 02/2003, 02/2009   Adenomatous polyps  . Hypertension   . Osteoarthritis    hands  . Osteopenia    DEXA 02/2011, 02/2013: -1.9    Past Surgical History:  Procedure Laterality Date  . BREAST ENHANCEMENT SURGERY  1987  . COLONOSCOPY      Current Medications: Outpatient Medications Prior to Visit  Medication Sig Dispense Refill  . alendronate (FOSAMAX) 70 MG tablet Take 1 tablet (70 mg total) by mouth every 7 (seven) days. Take  with a full glass of water on an empty stomach. 12 tablet 3  . Calcium Carbonate-Vitamin D (CALCIUM-D PO) Take 1 tablet by mouth.    . Cholecalciferol (VITAMIN D3) 50 MCG (2000 UT) TABS Take 2,000 Units by mouth daily.    [name] diltiazem (DILT-XR) 240 MG 24 hr capsule Take 1 capsule (240 mg total) by mouth daily. 90 capsule 3  . famotidine (PEPCID) 40 MG tablet Take 1 tablet (40 mg total) by mouth every evening. 30 tablet 1  . lisinopril-hydrochlorothiazide (ZESTORETIC) 20-25 MG tablet Take  1 tablet by mouth daily. 90 tablet 3  . Multiple Vitamin (MULTIVITAMIN) tablet Take 1 tablet by mouth daily.     No facility-administered medications prior to visit.     Allergies:   Patient has no known allergies.   Social History   Socioeconomic History  . Marital status: Married    Spouse name: Not on file  . Number of children: 2  . Years of education: Not on file  . Highest education level: Not on file  Occupational History  . Occupation: retired  Tobacco Use  . Smoking status: Never Smoker  . Smokeless tobacco: Never Used  Substance and Sexual Activity  . Alcohol use: Yes    Comment: wine with dinner  . Drug use: No  . Sexual activity: Not Currently  Other Topics Concern  . Not on file  Social History Narrative   Exercises regularly   Social Determinants of Health   Financial Resource Strain:   . Difficulty of Paying Living Expenses:   Food Insecurity:   . Worried About Charity fundraiser in the Last Year:   . Arboriculturist in the Last Year:   Transportation Needs:   . Film/video editor (Medical):   [name] Lack of Transportation (Non-Medical):   Physical Activity:   . Days of Exercise per Week:   . Minutes of Exercise per Session:   Stress:   . Feeling of Stress :   Social Connections:   . Frequency of Communication with Friends and Family:   . Frequency of Social Gatherings with Friends and Family:   . Attends Religious Services:   . Active Member of Clubs or Organizations:   . Attends Archivist Meetings:   [name] Marital Status:     Socially, she is in her second marriage of 48 years.  Unfortunately, her first husband died at age 81.  She has 2 children with her first husband including Sherry Mitchell, age 78 who works for [place] at [facility] [facility] and Sherry Mitchell age 26.  She has 2 grandchildren. There is no tobacco use.  She exercises regularly and either walks or rides a stationary bike 3 to 4 days/week for up to 30 to 40 minutes.  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  Family History:  The patient's family history includes Cancer in her mother; Heart disease in an other family member.  Her mother died at age 41 with cancer of her salivary glands.  Her father died at age 6 and had a history of rheumatic fever and may have suffered a heart attack.  She has 1 brother age 26 with dementia.  ROS General: Negative; No fevers, chills, or night sweats;  HEENT: Negative; No changes in vision or hearing, sinus congestion, difficulty swallowing Pulmonary: Negative; No cough, wheezing, shortness of breath, hemoptysis Cardiovascular: Negative; No chest pain, presyncope, syncope, palpitations GI: Negative; No nausea, vomiting, diarrhea, or abdominal  pain GU: Negative; No dysuria, hematuria, or difficulty voiding Musculoskeletal: Negative; no myalgias, joint pain, or weakness Hematologic/Oncology: Negative; no easy bruising, bleeding Endocrine: Negative; no heat/cold intolerance; no diabetes Neuro: Negative; no changes in balance, headaches Skin: Negative; No rashes or skin lesions Psychiatric: Negative; No behavioral problems, depression Sleep: Negative; No snoring, daytime sleepiness, hypersomnolence, bruxism, restless legs, hypnogognic hallucinations, no cataplexy Other comprehensive 14 point system review is negative.   PHYSICAL EXAM:   VS:  BP 136/70   Pulse 63   Ht '5\' 7"'  (1.702 m)   Wt 155 lb (70.3 kg)   SpO2 97%   BMI 24.28 kg/m     Repeat blood pressure by me was 114/68  Wt Readings from Last 3 Encounters:  06/07/19 155 lb (70.3 kg)  04/19/19 152 lb 9.6 oz (69.2 kg)  04/13/19 156 lb 12.8 oz (71.1 kg)    General: Alert, oriented, no distress.  Skin: normal turgor, no rashes, warm and dry HEENT: Normocephalic, atraumatic. Pupils equal round and reactive to light; sclera anicteric; extraocular muscles intact;  Nose without nasal septal hypertrophy Mouth/Parynx benign; Mallinpatti scale 2 Neck: No JVD, no carotid  bruits; normal carotid upstroke Lungs: clear to ausculatation and percussion; no wheezing or rales Chest wall: without tenderness to palpitation Heart: PMI not displaced, RRR, s1 s2 normal, 1/6 systolic murmur along the left sternal border, no diastolic murmur, no rubs, gallops, thrills, or heaves Abdomen: soft, nontender; no hepatosplenomehaly, BS+; abdominal aorta nontender and not dilated by palpation. Back: no CVA tenderness Pulses 2+ Musculoskeletal: full range of motion, normal strength, no joint deformities Extremities: no clubbing cyanosis or edema, Homan's sign negative  Neurologic: grossly nonfocal; Cranial nerves grossly wnl Psychologic: Normal mood and affect   Studies/Labs Reviewed:   EKG:  EKG isordered today.  ECG (independently read by me): Normal sinus rhythm at 63 bpm.  No ectopy.  Normal intervals.  April 19, 2019 ECG (independently read by me): Normal sinus rhythm at 72 bpm with mild sinus arrhythmia.  No ectopy.  Normal intervals  Recent Labs: BMP Latest Ref Rng & Units 04/05/2019 04/11/2018 04/04/2017  Glucose 70 - 99 mg/dL 116(H) 110(H) 106(H)  BUN 6 - 23 mg/dL '13 16 12  ' Creatinine 0.40 - 1.20 mg/dL 0.63 0.65 0.60  Sodium 135 - 145 mEq/L 138 138 140  Potassium 3.5 - 5.1 mEq/L 3.7 3.8 3.6  Chloride 96 - 112 mEq/L 102 100 103  CO2 19 - 32 mEq/L '30 30 29  ' Calcium 8.4 - 10.5 mg/dL 9.7 10.4 9.9     Hepatic Function Latest Ref Rng & Units 04/05/2019 04/11/2018 04/04/2017  Total Protein 6.0 - 8.3 g/dL 7.4 7.7 7.2  Albumin 3.5 - 5.2 g/dL 4.4 4.5 4.2  AST 0 - 37 U/L '16 18 11  ' ALT 0 - 35 U/L '14 14 10  ' Alk Phosphatase 39 - 117 U/L 73 72 66  Total Bilirubin 0.2 - 1.2 mg/dL 0.8 0.6 0.7  Bilirubin, Direct 0.1 - 0.5 mg/dL - - -    CBC Latest Ref Rng & Units 04/05/2019 04/11/2018 04/04/2017  WBC 4.0 - 10.5 K/uL 6.1 6.0 5.2  Hemoglobin 12.0 - 15.0 g/dL 14.2 14.9 13.9  Hematocrit 36.0 - 46.0 % 42.2 44.0 42.1  Platelets 150.0 - 400.0 K/uL 298.0 280.0 274.0   Lab Results    Component Value Date   MCV 92.2 04/05/2019   MCV 93.1 04/11/2018   MCV 93.0 04/04/2017   Lab Results  Component Value Date   TSH 0.81 04/05/2019  Lab Results  Component Value Date   HGBA1C 6.2 04/05/2019     BNP No results found for: BNP  ProBNP No results found for: PROBNP   Lipid Panel     Component Value Date/Time   CHOL 208 (H) 04/05/2019 1036   TRIG 89.0 04/05/2019 1036   HDL 79.70 04/05/2019 1036   CHOLHDL 3 04/05/2019 1036   VLDL 17.8 04/05/2019 1036   LDLCALC 111 (H) 04/05/2019 1036   LDLDIRECT 125.1 03/01/2013 1050     RADIOLOGY: No results found.   Additional studies/ records that were reviewed today include:  I reviewed the records of Dr. Billey Gosling  ECHO 05/04/2019 IMPRESSIONS  1. Left ventricular ejection fraction, by estimation, is 60 to 65%. The  left ventricle has normal function. The left ventricle has no regional  wall motion abnormalities. There is mildly increased asymmetric left  ventricular hypertrophy of the posterior  segment. Left ventricular diastolic parameters were normal, medial e'  tissue Doppler 7.7 cm/s.  2. Right ventricular systolic function is normal. The right ventricular  size is normal. There is mildly elevated pulmonary artery systolic  pressure.  3. The mitral valve is normal in structure and function. Trivial mitral  valve regurgitation. No evidence of mitral stenosis.  4. The aortic valve is tricuspid. Aortic valve regurgitation is not  visualized. Mild aortic valve sclerosis is present, with no evidence of  aortic valve stenosis.  5. Aortic dilatation noted. There is borderline dilatation of the  ascending aorta measuring 39 mm.  6. The inferior vena cava is normal in size with greater than 50%  respiratory variability, suggesting right atrial pressure of 3 mmHg.   CT CARDIAC SCORING FINDINGS:    Left Main: No coronary artery calcification  LAD: No coronary calcification  LCx: No coronary  calcification  RCA: No coronary calcification  Total Agatston Score: 0  MESA database percentile: 0  AORTA MEASUREMENTS:  Ascending Aorta: 37 mm  Descending Aorta: 24 mm  OTHER FINDINGS:  Limited view of the lung parenchyma demonstrates no suspicious nodularity. Airways are normal.  Limited view of the mediastinum demonstrates no adenopathy. Esophagus normal.  Limited view of the upper abdomen unremarkable.  Limited view of the skeleton and chest wall is unremarkable.  IMPRESSION: 1. No coronary artery calcification.  2. Total Agatston Score: 0  3. MESA age and sex matched database percentile: 0  FINDINGS: Non-cardiac: See separate report from [facility].  Ascending Aorta: Top normal size.  Atherosclerosis.  Pericardium: Normal  Coronary arteries: Normal origins.  IMPRESSION: Coronary calcium score of 0. This was 0 percentile for age and sex matched control.    ASSESSMENT:    1. Murmur   2. Aortic valve sclerosis   3. Essential hypertension   4. Gastroesophageal reflux disease without esophagitis     PLAN:  Sherry Mitchell is a very pleasant young appearing 74 year old female who has a history of hypertension for at least 10 years and recently has been on medical regimen consisting of lisinopril HCT 20/25 mg in addition to diltiazem XR 240 mg daily.  Her blood pressure today continues to be excellent.  I initially saw her, she had experienced 2 episodes of nonexertional lower sternal chest discomfort and tightness which occurred in the supine position.  She had taken H2 blockade with Pepcid with complete resolution.  She has not had any recurrence.  I reviewed her echo Doppler study in detail today which shows an an EF of 60 to 65%.  She had  normal tissue Doppler.  She had normal diastolic parameters.  I reviewed her cardiac calcium score with her in detail which was excellent with a calcium score 0.  Clinically she is stable  from a cardiovascular standpoint.  Reiterated to her that I did not believe her prior discomfort was a cardiac etiology.  She will continue with her daily exercise.  Will return to her primary physician Dr. Billey Gosling.  I will be available to see her in the future on a as needed basis if problems arise.  Medication Adjustments/Labs and Tests Ordered: Current medicines are reviewed at length with the patient today.  Concerns regarding medicines are outlined above.  Medication changes, Labs and Tests ordered today are listed in the Patient Instructions below. Patient Instructions  Medication Instructions:  CONTINUE WITH CURRENT MEDICATIONS. NO CHANGES.  *If you need a refill on your cardiac medications before your next appointment, please call your pharmacy*    Follow-Up: At [facility], you and your health needs are our priority.  As part of our continuing mission to provide you with exceptional heart care, we have created designated Provider Care Teams.  These Care Teams include your primary Cardiologist (physician) and Advanced Practice Providers (APPs -  Physician Assistants and Nurse Practitioners) who all work together to provide you with the care you need, when you need it.  We recommend signing up for the patient portal called "MyChart".  Sign up information is provided on this After Visit Summary.  MyChart is used to connect with patients for Virtual Visits (Telemedicine).  Patients are able to view lab/test results, encounter notes, upcoming appointments, etc.  Non-urgent messages can be sent to your provider as well.   To learn more about what you can do with MyChart, go to [email or web address].    Your next appointment:    as needed with Dr.Avon Molock     Signed, Shelva Majestic, MD  06/07/2019 4:16 PM    [facility] [address] [place], [place], [place]  [address] Phone: [phone]

## 2019-07-24 DIAGNOSIS — Z85828 Personal history of other malignant neoplasm of skin: Secondary | ICD-10-CM | POA: Diagnosis not present

## 2019-07-24 DIAGNOSIS — L821 Other seborrheic keratosis: Secondary | ICD-10-CM | POA: Diagnosis not present

## 2019-07-24 DIAGNOSIS — L814 Other melanin hyperpigmentation: Secondary | ICD-10-CM | POA: Diagnosis not present

## 2019-07-24 DIAGNOSIS — L570 Actinic keratosis: Secondary | ICD-10-CM | POA: Diagnosis not present

## 2019-07-24 DIAGNOSIS — L918 Other hypertrophic disorders of the skin: Secondary | ICD-10-CM | POA: Diagnosis not present

## 2019-08-02 ENCOUNTER — Encounter: Payer: Self-pay | Admitting: Internal Medicine

## 2019-08-02 NOTE — Progress Notes (Signed)
Outside notes received. Information abstracted. Notes sent to scan.  

## 2019-08-17 DIAGNOSIS — D485 Neoplasm of uncertain behavior of skin: Secondary | ICD-10-CM | POA: Diagnosis not present

## 2019-08-17 DIAGNOSIS — L570 Actinic keratosis: Secondary | ICD-10-CM | POA: Diagnosis not present

## 2019-08-17 DIAGNOSIS — L821 Other seborrheic keratosis: Secondary | ICD-10-CM | POA: Diagnosis not present

## 2019-08-17 DIAGNOSIS — Z85828 Personal history of other malignant neoplasm of skin: Secondary | ICD-10-CM | POA: Diagnosis not present

## 2019-09-06 DIAGNOSIS — L821 Other seborrheic keratosis: Secondary | ICD-10-CM | POA: Diagnosis not present

## 2019-09-06 DIAGNOSIS — L570 Actinic keratosis: Secondary | ICD-10-CM | POA: Diagnosis not present

## 2019-09-06 DIAGNOSIS — Z85828 Personal history of other malignant neoplasm of skin: Secondary | ICD-10-CM | POA: Diagnosis not present

## 2019-11-07 DIAGNOSIS — R69 Illness, unspecified: Secondary | ICD-10-CM | POA: Diagnosis not present

## 2019-12-04 DIAGNOSIS — R69 Illness, unspecified: Secondary | ICD-10-CM | POA: Diagnosis not present

## 2019-12-05 ENCOUNTER — Other Ambulatory Visit: Payer: Self-pay

## 2019-12-05 ENCOUNTER — Ambulatory Visit (INDEPENDENT_AMBULATORY_CARE_PROVIDER_SITE_OTHER): Payer: Medicare HMO

## 2019-12-05 DIAGNOSIS — Z0000 Encounter for general adult medical examination without abnormal findings: Secondary | ICD-10-CM | POA: Diagnosis not present

## 2019-12-05 NOTE — Progress Notes (Signed)
I connected with Clearence Ped today by telephone and verified that I am speaking with the correct person using two identifiers. Location patient: home Location provider: work Persons participating in the virtual visit: Ludmilla Mcgillis and [employer]. Ameera Tigue, LPN.   I discussed the limitations, risks, security and privacy concerns of performing an evaluation and management service by telephone and the availability of in person appointments. I also discussed with the patient that there may be a patient responsible charge related to this service. The patient expressed understanding and verbally consented to this telephonic visit.    Interactive audio and video telecommunications were attempted between this provider and patient, however failed, due to patient having technical difficulties OR patient did not have access to video capability.  We continued and completed visit with audio only.  Some vital signs may be absent or patient reported.   Time Spent with patient on telephone encounter: 20 minutes  Subjective:   Sherry Mitchell is a 74 y.o. female who presents for Medicare Annual (Subsequent) preventive examination.  Review of Systems    No ROS. Medicare Wellness Visit. Cardiac Risk Factors include: advanced age (>8men, >22 women);dyslipidemia;family history of premature cardiovascular disease     Objective:    There were no vitals filed for this visit. There is no height or weight on file to calculate BMI.  Advanced Directives 12/05/2019 11/29/2018 11/24/2017 11/19/2016 05/08/2014 03/10/2014 02/19/2014  Does Patient Have a Medical Advance Directive? Yes Yes Yes Yes Yes No Yes  Type of Paramedic of [place];Living will [place];Living will [place];Living will [place];Living will - - [place]  Does patient want to make changes to medical advance directive? No - Patient declined - - -  Yes - information given - -  Copy of [place] in Chart? - Yes - validated most recent copy scanned in chart (See row information) No - copy requested No - copy requested No - copy requested - -    Current Medications (verified) Outpatient Encounter Medications as of 12/05/2019  Medication Sig  . alendronate (FOSAMAX) 70 MG tablet Take 1 tablet (70 mg total) by mouth every 7 (seven) days. Take with a full glass of water on an empty stomach.  . Cholecalciferol (VITAMIN D3) 50 MCG (2000 UT) TABS Take 2,000 Units by mouth daily.  [name] diltiazem (DILT-XR) 240 MG 24 hr capsule Take 1 capsule (240 mg total) by mouth daily.  [name] lisinopril-hydrochlorothiazide (ZESTORETIC) 20-25 MG tablet Take 1 tablet by mouth daily.  . Calcium Carbonate-Vitamin D (CALCIUM-D PO) Take 1 tablet by mouth. (Patient not taking: Reported on 12/05/2019)  . famotidine (PEPCID) 40 MG tablet Take 1 tablet (40 mg total) by mouth every evening. (Patient not taking: Reported on 12/05/2019)  . Multiple Vitamin (MULTIVITAMIN) tablet Take 1 tablet by mouth daily. (Patient not taking: Reported on 12/05/2019)   No facility-administered encounter medications on file as of 12/05/2019.    Allergies (verified) Patient has no known allergies.   History: Past Medical History:  Diagnosis Date  . Colon polyps 02/2003, 02/2009   Adenomatous polyps  . Hypertension   . Osteoarthritis    hands  . Osteopenia    DEXA 02/2011, 02/2013: -1.9   Past Surgical History:  Procedure Laterality Date  . BREAST ENHANCEMENT SURGERY  1987  . COLONOSCOPY     Family History  Problem Relation Age of Onset  . Cancer Mother  cancer of saliva glands  . Heart disease Other   . Colon cancer Neg Hx   . Esophageal cancer Neg Hx   . Stomach cancer Neg Hx   . Rectal cancer Neg Hx    Social History   Socioeconomic History  . Marital status: Married    Spouse name: Not on file  . Number of children: 2  . Years of education: Not on  file  . Highest education level: Not on file  Occupational History  . Occupation: retired  Tobacco Use  . Smoking status: Never Smoker  . Smokeless tobacco: Never Used  Vaping Use  . Vaping Use: Never used  Substance and Sexual Activity  . Alcohol use: Yes    Comment: wine with dinner  . Drug use: No  . Sexual activity: Not Currently  Other Topics Concern  . Not on file  Social History Narrative   Lives with her husband   Exercises regularly   Drinks 1 glass of wine with dinner   Social Determinants of Health   Financial Resource Strain: Low Risk   . Difficulty of Paying Living Expenses: Not hard at all  Food Insecurity: No Food Insecurity  . Worried About Charity fundraiser in the Last Year: Never true  . Ran Out of Food in the Last Year: Never true  Transportation Needs: No Transportation Needs  . Lack of Transportation (Medical): No  . Lack of Transportation (Non-Medical): No  Physical Activity: Sufficiently Active  . Days of Exercise per Week: 7 days  . Minutes of Exercise per Session: 60 min  Stress: No Stress Concern Present  . Feeling of Stress : Not at all  Social Connections:   . Frequency of Communication with Friends and Family: Not on file  . Frequency of Social Gatherings with Friends and Family: Not on file  . Attends Religious Services: Not on file  . Active Member of Clubs or Organizations: Not on file  . Attends Archivist Meetings: Not on file  . Marital Status: Not on file    Tobacco Counseling Counseling given: Not Answered   Clinical Intake:  Pre-visit preparation completed: Yes  Pain : No/denies pain     Nutritional Risks: None Diabetes: No  How often do you need to have someone help you when you read instructions, pamphlets, or other written materials from your doctor or pharmacy?: 1 - Never What is the last grade level you completed in school?: 3 of college  Diabetic? no  Interpreter Needed?: No  Information entered  by :: Lisette Abu, LPN   Activities of Daily Living In your present state of health, do you have any difficulty performing the following activities: 12/05/2019  Hearing? N  Vision? N  Difficulty concentrating or making decisions? N  Walking or climbing stairs? N  Dressing or bathing? N  Doing errands, shopping? N  Preparing Food and eating ? N  Using the Toilet? N  In the past six months, have you accidently leaked urine? N  Do you have problems with loss of bowel control? N  Managing your Medications? N  Managing your Finances? N  Housekeeping or managing your Housekeeping? N  Some recent data might be hidden    Patient Care Team: Binnie Rail, MD as PCP - General (Internal Medicine) Ladene Artist, MD (Gastroenterology) Azucena Fallen, MD (Obstetrics and Gynecology) Webb Laws, [place] as Referring Physician [facility]) Dermatology, [facility] any recent Medical Services you may have received from other  than [facility] providers in the past year (date may be approximate).     Assessment:   This is a routine wellness examination for [place].  Hearing/Vision screen No exam data present  Dietary issues and exercise activities discussed: Current Exercise Habits: Home exercise routine, Type of exercise: walking (Goal: 11, 000 steps daily), Time (Minutes): 60, Frequency (Times/Week): 7, Weekly Exercise (Minutes/Week): 420, Intensity: Moderate, Exercise limited by: None identified  Goals    . Patient Stated     Stay physically and socially active. Continue to eat healthy and enjoy life.    . Take time for myself to be as healthy as possible     Read books, exercise, enjoy life, and family. Continue to do a lot with my grandchildren.      Depression Screen PHQ 2/9 Scores 12/05/2019 04/13/2019 11/29/2018 04/11/2018 11/24/2017 11/19/2016 04/05/2016  PHQ - 2 Score 0 0 0 0 0 0 0  PHQ- 9 Score - - - - - 3 -    Fall Risk Fall Risk  12/05/2019 04/13/2019 11/29/2018 04/11/2018  11/24/2017  Falls in the past year? 0 0 0 0 No  Number falls in past yr: 0 0 0 - -  Injury with Fall? 0 - 0 - -  Risk for fall due to : No Fall Risks - - - -  Follow up Falls evaluation completed - - - -    Any stairs in or around the home? Yes  If so, are there any without handrails? No  Home free of loose throw rugs in walkways, pet beds, electrical cords, etc? Yes  Adequate lighting in your home to reduce risk of falls? Yes   ASSISTIVE DEVICES UTILIZED TO PREVENT FALLS:  Life alert? No  Use of a cane, walker or w/c? No  Grab bars in the bathroom? No  Shower chair or bench in shower? No  Elevated toilet seat or a handicapped toilet? No   TIMED UP AND GO:  Was the test performed? No .  Length of time to ambulate 10 feet: 0 sec.   Gait steady and fast without use of assistive device  Cognitive Function: Patient is cogitatively intact.        Immunizations Immunization History  Administered Date(s) Administered  . Fluad Quad(high Dose 65+) 12/22/2018  . Influenza Split 02/26/2011, 11/29/2011, 12/07/2013  . Influenza, High Dose Seasonal PF 11/28/2012, 01/03/2015, 11/19/2016, 11/24/2017  . Influenza-Unspecified 01/03/2015, 12/26/2015  . PFIZER SARS-COV-2 Vaccination 04/26/2019, 05/21/2019  . Pneumococcal Conjugate-13 03/04/2014  . Pneumococcal Polysaccharide-23 03/06/2015  . Tdap 02/26/2011    TDAP status: Up to date Flu Vaccine status: Up to date Pneumococcal vaccine status: Up to date Covid-19 vaccine status: Completed vaccines  Qualifies for Shingles Vaccine? Yes   Zostavax completed Yes   Shingrix Completed?: Yes  Screening Tests Health Maintenance  Topic Date Due  . INFLUENZA VACCINE  10/21/2019  . DEXA SCAN  04/14/2020  . TETANUS/TDAP  02/25/2021  . COLONOSCOPY  03/05/2021  . MAMMOGRAM  03/20/2021  . COVID-19 Vaccine  Completed  . Hepatitis C Screening  Completed  . PNA vac Low Risk Adult  Completed    Health Maintenance  Health Maintenance Due    Topic Date Due  . INFLUENZA VACCINE  10/21/2019    Colorectal cancer screening: Completed 03/05/2014. Repeat every 7 years Mammogram status: Completed 03/21/2019. Repeat every year Bone Density status: Completed 04/14/2018. Results reflect: Bone density results: OSTEOPOROSIS. Repeat every 2 years.  Lung Cancer Screening: (Low Dose CT Chest recommended if  Age 23-80 years, 30 pack-year currently smoking OR have quit w/in 15years.) does not qualify.   Lung Cancer Screening Referral: no  Additional Screening:  Hepatitis C Screening: does qualify; Completed: yes  Vision Screening: Recommended annual ophthalmology exams for early detection of glaucoma and other disorders of the eye. Is the patient up to date with their annual eye exam?  Yes  Who is the provider or what is the name of the office in which the patient attends annual eye exams? Webb Laws, MD If pt is not established with a provider, would they like to be referred to a provider to establish care? No .   Dental Screening: Recommended annual dental exams for proper oral hygiene  Community Resource Referral / Chronic Care Management: CRR required this visit?  No   CCM required this visit?  No      Plan:     I have personally reviewed and noted the following in the patient's chart:   . Medical and social history . Use of alcohol, tobacco or illicit drugs  . Current medications and supplements . Functional ability and status . Nutritional status . Physical activity . Advanced directives . List of other physicians . Hospitalizations, surgeries, and ER visits in previous 12 months . Vitals . Screenings to include cognitive, depression, and falls . Referrals and appointments  In addition, I have reviewed and discussed with patient certain preventive protocols, quality metrics, and best practice recommendations. A written personalized care plan for preventive services as well as general preventive health  recommendations were provided to patient.     Sheral Flow, LPN   [id number]   Nurse Notes:  Patient is cogitatively intact. There were no vitals filed for this visit. There is no height or weight on file to calculate BMI. Patient stated that she has no issues with gait or balance; does not use any assistive devices.

## 2019-12-05 NOTE — Patient Instructions (Addendum)
Sherry Mitchell , Thank you for taking time to come for your Medicare Wellness Visit. I appreciate your ongoing commitment to your health goals. Please review the following plan we discussed and let me know if I can assist you in the future.   Screening recommendations/referrals: Colonoscopy: 03/05/2014; due every 7 years Mammogram: 03/21/2019; due every year Bone Density: 04/14/2018; due every 2 years Recommended yearly ophthalmology/optometry visit for glaucoma screening and checkup Recommended yearly dental visit for hygiene and checkup  Vaccinations: Influenza vaccine: 12/04/2019 (received at [facility]) Pneumococcal vaccine: completed Tdap vaccine: 02/26/2011; due every 10 years Shingles vaccine: completed   Covid-19: completed  Advanced directives: Documents on file.  Conditions/risks identified: Yes; Reviewed health maintenance screenings with patient today and relevant education, vaccines, and/or referrals were provided. Please continue to do your personal lifestyle choices by: daily care of teeth and gums, regular physical activity (goal should be 5 days a week for 30 minutes), eat a healthy diet, avoid tobacco and drug use, limiting any alcohol intake, taking a low-dose aspirin (if not allergic or have been advised by your provider otherwise) and taking vitamins and minerals as recommended by your provider. Continue doing brain stimulating activities (puzzles, reading, adult coloring books, staying active) to keep memory sharp. Continue to eat heart healthy diet (full of fruits, vegetables, whole grains, lean protein, water--limit salt, fat, and sugar intake) and increase physical activity as tolerated.  Next appointment: Please schedule your next Medicare Wellness Visit with your Nurse Health Advisor in 1 year.  Preventive Care 71 Years and Older, Female Preventive care refers to lifestyle choices and visits with your health care provider that can promote health and wellness. What does  preventive care include?  A yearly physical exam. This is also called an annual well check.  Dental exams once or twice a year.  Routine eye exams. Ask your health care provider how often you should have your eyes checked.  Personal lifestyle choices, including:  Daily care of your teeth and gums.  Regular physical activity.  Eating a healthy diet.  Avoiding tobacco and drug use.  Limiting alcohol use.  Practicing safe sex.  Taking low-dose aspirin every day.  Taking vitamin and mineral supplements as recommended by your health care provider. What happens during an annual well check? The services and screenings done by your health care provider during your annual well check will depend on your age, overall health, lifestyle risk factors, and family history of disease. Counseling  Your health care provider may ask you questions about your:  Alcohol use.  Tobacco use.  Drug use.  Emotional well-being.  Home and relationship well-being.  Sexual activity.  Eating habits.  History of falls.  Memory and ability to understand (cognition).  Work and work Statistician.  Reproductive health. Screening  You may have the following tests or measurements:  Height, weight, and BMI.  Blood pressure.  Lipid and cholesterol levels. These may be checked every 5 years, or more frequently if you are over 73 years old.  Skin check.  Lung cancer screening. You may have this screening every year starting at age 20 if you have a 30-pack-year history of smoking and currently smoke or have quit within the past 15 years.  Fecal occult blood test (FOBT) of the stool. You may have this test every year starting at age 96.  Flexible sigmoidoscopy or colonoscopy. You may have a sigmoidoscopy every 5 years or a colonoscopy every 10 years starting at age 64.  Hepatitis C blood test.  Hepatitis B blood test.  Sexually transmitted disease (STD) testing.  Diabetes screening. This  is done by checking your blood sugar (glucose) after you have not eaten for a while (fasting). You may have this done every 1-3 years.  Bone density scan. This is done to screen for osteoporosis. You may have this done starting at age 44.  Mammogram. This may be done every 1-2 years. Talk to your health care provider about how often you should have regular mammograms. Talk with your health care provider about your test results, treatment options, and if necessary, the need for more tests. Vaccines  Your health care provider may recommend certain vaccines, such as:  Influenza vaccine. This is recommended every year.  Tetanus, diphtheria, and acellular pertussis (Tdap, Td) vaccine. You may need a Td booster every 10 years.  Zoster vaccine. You may need this after age 74.  Pneumococcal 13-valent conjugate (PCV13) vaccine. One dose is recommended after age 8.  Pneumococcal polysaccharide (PPSV23) vaccine. One dose is recommended after age 75. Talk to your health care provider about which screenings and vaccines you need and how often you need them. This information is not intended to replace advice given to you by your health care provider. Make sure you discuss any questions you have with your health care provider. Document Released: 04/04/2015 Document Revised: 11/26/2015 Document Reviewed: 01/07/2015 Elsevier Interactive Patient Education  2017 [employer] Prevention in the Home Falls can cause injuries. They can happen to people of all ages. There are many things you can do to make your home safe and to help prevent falls. What can I do on the outside of my home?  Regularly fix the edges of walkways and driveways and fix any cracks.  Remove anything that might make you trip as you walk through a door, such as a raised step or threshold.  Trim any bushes or trees on the path to your home.  Use bright outdoor lighting.  Clear any walking paths of anything that might make  someone trip, such as rocks or tools.  Regularly check to see if handrails are loose or broken. Make sure that both sides of any steps have handrails.  Any raised decks and porches should have guardrails on the edges.  Have any leaves, snow, or ice cleared regularly.  Use sand or salt on walking paths during winter.  Clean up any spills in your garage right away. This includes oil or grease spills. What can I do in the bathroom?  Use night lights.  Install grab bars by the toilet and in the tub and shower. Do not use towel bars as grab bars.  Use non-skid mats or decals in the tub or shower.  If you need to sit down in the shower, use a plastic, non-slip stool.  Keep the floor dry. Clean up any water that spills on the floor as soon as it happens.  Remove soap buildup in the tub or shower regularly.  Attach bath mats securely with double-sided non-slip rug tape.  Do not have throw rugs and other things on the floor that can make you trip. What can I do in the bedroom?  Use night lights.  Make sure that you have a light by your bed that is easy to reach.  Do not use any sheets or blankets that are too big for your bed. They should not hang down onto the floor.  Have a firm chair that has side arms. You can use this  for support while you get dressed.  Do not have throw rugs and other things on the floor that can make you trip. What can I do in the kitchen?  Clean up any spills right away.  Avoid walking on wet floors.  Keep items that you use a lot in easy-to-reach places.  If you need to reach something above you, use a strong step stool that has a grab bar.  Keep electrical cords out of the way.  Do not use floor polish or wax that makes floors slippery. If you must use wax, use non-skid floor wax.  Do not have throw rugs and other things on the floor that can make you trip. What can I do with my stairs?  Do not leave any items on the stairs.  Make sure that  there are handrails on both sides of the stairs and use them. Fix handrails that are broken or loose. Make sure that handrails are as long as the stairways.  Check any carpeting to make sure that it is firmly attached to the stairs. Fix any carpet that is loose or worn.  Avoid having throw rugs at the top or bottom of the stairs. If you do have throw rugs, attach them to the floor with carpet tape.  Make sure that you have a light switch at the top of the stairs and the bottom of the stairs. If you do not have them, ask someone to add them for you. What else can I do to help prevent falls?  Wear shoes that:  Do not have high heels.  Have rubber bottoms.  Are comfortable and fit you well.  Are closed at the toe. Do not wear sandals.  If you use a stepladder:  Make sure that it is fully opened. Do not climb a closed stepladder.  Make sure that both sides of the stepladder are locked into place.  Ask someone to hold it for you, if possible.  Clearly mark and make sure that you can see:  Any grab bars or handrails.  First and last steps.  Where the edge of each step is.  Use tools that help you move around (mobility aids) if they are needed. These include:  Canes.  Walkers.  Scooters.  Crutches.  Turn on the lights when you go into a dark area. Replace any light bulbs as soon as they burn out.  Set up your furniture so you have a clear path. Avoid moving your furniture around.  If any of your floors are uneven, fix them.  If there are any pets around you, be aware of where they are.  Review your medicines with your doctor. Some medicines can make you feel dizzy. This can increase your chance of falling. Ask your doctor what other things that you can do to help prevent falls. This information is not intended to replace advice given to you by your health care provider. Make sure you discuss any questions you have with your health care provider. Document Released:  01/02/2009 Document Revised: 08/14/2015 Document Reviewed: 04/12/2014 Elsevier Interactive Patient Education  2017 [employer].

## 2020-01-02 ENCOUNTER — Encounter (HOSPITAL_COMMUNITY): Payer: Self-pay | Admitting: *Deleted

## 2020-01-02 ENCOUNTER — Other Ambulatory Visit: Payer: Self-pay

## 2020-01-02 ENCOUNTER — Emergency Department (HOSPITAL_COMMUNITY)
Admission: EM | Admit: 2020-01-02 | Discharge: 2020-01-03 | Disposition: A | Discharge status: Home / Self Care | Payer: Medicare HMO | Attending: Emergency Medicine | Admitting: Emergency Medicine

## 2020-01-02 DIAGNOSIS — K5732 Diverticulitis of large intestine without perforation or abscess without bleeding: Secondary | ICD-10-CM | POA: Insufficient documentation

## 2020-01-02 DIAGNOSIS — K760 Fatty (change of) liver, not elsewhere classified: Secondary | ICD-10-CM | POA: Diagnosis not present

## 2020-01-02 DIAGNOSIS — K5792 Diverticulitis of intestine, part unspecified, without perforation or abscess without bleeding: Secondary | ICD-10-CM

## 2020-01-02 DIAGNOSIS — Z79899 Other long term (current) drug therapy: Secondary | ICD-10-CM | POA: Diagnosis not present

## 2020-01-02 DIAGNOSIS — I10 Essential (primary) hypertension: Secondary | ICD-10-CM | POA: Diagnosis not present

## 2020-01-02 DIAGNOSIS — R109 Unspecified abdominal pain: Secondary | ICD-10-CM | POA: Diagnosis present

## 2020-01-02 NOTE — ED Triage Notes (Signed)
abd pain since last pm with some bright red blood in her stools  The abd pain returned and went away  No previous history

## 2020-01-03 ENCOUNTER — Emergency Department (HOSPITAL_COMMUNITY): Payer: Medicare HMO

## 2020-01-03 DIAGNOSIS — K760 Fatty (change of) liver, not elsewhere classified: Secondary | ICD-10-CM | POA: Diagnosis not present

## 2020-01-03 DIAGNOSIS — K5792 Diverticulitis of intestine, part unspecified, without perforation or abscess without bleeding: Secondary | ICD-10-CM | POA: Diagnosis not present

## 2020-01-03 LAB — URINALYSIS, ROUTINE W REFLEX MICROSCOPIC
Bilirubin Urine: NEGATIVE
Glucose, UA: NEGATIVE mg/dL
Hgb urine dipstick: NEGATIVE
Ketones, ur: 5 mg/dL — AB
Nitrite: NEGATIVE
Protein, ur: NEGATIVE mg/dL
Specific Gravity, Urine: 1.017 (ref 1.005–1.030)
pH: 6 (ref 5.0–8.0)

## 2020-01-03 LAB — COMPREHENSIVE METABOLIC PANEL
ALT: 19 U/L (ref 0–44)
AST: 18 U/L (ref 15–41)
Albumin: 3.9 g/dL (ref 3.5–5.0)
Alkaline Phosphatase: 60 U/L (ref 38–126)
Anion gap: 10 (ref 5–15)
BUN: 12 mg/dL (ref 8–23)
CO2: 24 mmol/L (ref 22–32)
Calcium: 9 mg/dL (ref 8.9–10.3)
Chloride: 101 mmol/L (ref 98–111)
Creatinine, Ser: 0.75 mg/dL (ref 0.44–1.00)
GFR, Estimated: >60 mL/min (ref >60)
Glucose, Bld: 131 mg/dL — ABNORMAL HIGH (ref 70–99)
Potassium: 3.2 mmol/L — ABNORMAL LOW (ref 3.5–5.1)
Sodium: 135 mmol/L (ref 135–145)
Total Bilirubin: 0.7 mg/dL (ref 0.3–1.2)
Total Protein: 7.2 g/dL (ref 6.5–8.1)

## 2020-01-03 LAB — CBC
HCT: 41.6 % (ref 36.0–46.0)
Hemoglobin: 13.7 g/dL (ref 12.0–15.0)
MCH: 30.4 pg (ref 26.0–34.0)
MCHC: 32.9 g/dL (ref 30.0–36.0)
MCV: 92.2 fL (ref 80.0–100.0)
Platelets: 293 10*3/uL (ref 150–400)
RBC: 4.51 MIL/uL (ref 3.87–5.11)
RDW: 13.3 % (ref 11.5–15.5)
WBC: 8.1 10*3/uL (ref 4.0–10.5)
nRBC: 0 % (ref 0.0–0.2)

## 2020-01-03 LAB — POC OCCULT BLOOD, ED: Fecal Occult Bld: POSITIVE — AB

## 2020-01-03 LAB — LIPASE, BLOOD: Lipase: 29 U/L (ref 11–51)

## 2020-01-03 MED ORDER — OXYCODONE-ACETAMINOPHEN 5-325 MG PO TABS: 1.0000 | ORAL_TABLET | Freq: Four times a day (QID) | ORAL | 0 refills | Status: DC | PRN

## 2020-01-03 MED ORDER — AMOXICILLIN-POT CLAVULANATE 875-125 MG PO TABS: 1.0000 | ORAL_TABLET | Freq: Two times a day (BID) | ORAL | 0 refills | Status: DC

## 2020-01-03 MED ORDER — OXYCODONE-ACETAMINOPHEN 5-325 MG PO TABS
1.0000 | ORAL_TABLET | Freq: Once | ORAL | Status: AC
  Administered 2020-01-03: 1 via ORAL
  Filled 2020-01-03: qty 1

## 2020-01-03 MED ORDER — IOHEXOL 300 MG/ML  SOLN
100.0000 mL | Freq: Once | INTRAMUSCULAR | Status: AC | PRN
  Administered 2020-01-03: 100 mL via INTRAVENOUS

## 2020-01-03 NOTE — ED Notes (Signed)
Pt dc home per MD order. Discharge summary reviewed with pt- pt verbalizes understanding. No s/s of acute distress noted. Pt husband discharge ride home.

## 2020-01-03 NOTE — ED Notes (Signed)
Pt transported to CT ?

## 2020-01-03 NOTE — ED Notes (Signed)
Visitor at bedside.

## 2020-01-03 NOTE — Discharge Instructions (Signed)
You have been diagnosed with diverticulitis which is inflammations of your large intestine.  Please take antibiotic as prescribed for the full duration.  Take pain medication as needed but be aware that it can cause drowsiness.  Follow-up with your GI specialist as needed for further care.  Return if you have any concern.

## 2020-01-03 NOTE — ED Provider Notes (Signed)
[facility] DEPART[facility] Provider Note   CSN: [id number] Arrival date & time: 01/02/20  2315     History Chief Complaint  Patient presents with  . Abdominal Pain    Sherry Mitchell is a 74 y.o. female.  The history is provided by the patient and medical records. No language interpreter was used.  Abdominal Pain    74 year old female significant history of colon polyps, hyperlipidemia, hypertension, presenting for evaluation abdominal pain.  Patient report 2 days ago she was experiencing some constipation.  She took some laxative and that night she was awoke with severe abdominal pain.  Pain is located to her left lower quadrant, sharp, crampy, and episodic.  The next day she noticed when having bowel movement she would see a gush of bright red blood which concerns her.  She did reach out to her GI specialist, Dr. Fuller Plan, who recommended watchful waiting.  Since then she has had several similar episode of left lower quadrant abdominal pain with occasional trace of blood in her stool.  She report when pain intensity does break out in a sweat.  She did felt nauseous and vomited a few x2 days ago but none since.  She does not complain of any fever chills no chest pain shortness of breath productive cough no dysuria.  She mention her last colonoscopy that was several was very small benign polyps.  She is not on any blood thinner medication.  She mention her stools has not been hard since taking the laxative.   Past Medical History:  Diagnosis Date  . Colon polyps 02/2003, 02/2009   Adenomatous polyps  . Hypertension   . Osteoarthritis    hands  . Osteopenia    DEXA 02/2011, 02/2013: -1.9    Patient Active Problem List   Diagnosis Date Noted  . Chest tightness 04/16/2019  . Hyperlipidemia 04/12/2018  . Trigger finger 04/11/2018  . Prediabetes 04/09/2018  . Neck pain on right side 05/25/2017  . Osteoporosis 04/24/2016  . Osteoarthritis   . Hypertension    . Colon polyps     Past Surgical History:  Procedure Laterality Date  . BREAST ENHANCEMENT SURGERY  1987  . COLONOSCOPY       OB History   No obstetric history on file.     Family History  Problem Relation Age of Onset  . Cancer Mother        cancer of saliva glands  . Heart disease Other   . Colon cancer Neg Hx   . Esophageal cancer Neg Hx   . Stomach cancer Neg Hx   . Rectal cancer Neg Hx     Social History   Tobacco Use  . Smoking status: Never Smoker  . Smokeless tobacco: Never Used  Vaping Use  . Vaping Use: Never used  Substance Use Topics  . Alcohol use: Yes    Comment: wine with dinner  . Drug use: No    Home Medications Prior to Admission medications   Medication Sig Start Date End Date Taking? Authorizing Provider  alendronate (FOSAMAX) 70 MG tablet Take 1 tablet (70 mg total) by mouth every 7 (seven) days. Take with a full glass of water on an empty stomach. 04/13/19   Burns, Claudina Lick, MD  Calcium Carbonate-Vitamin D (CALCIUM-D PO) Take 1 tablet by mouth. Patient not taking: Reported on 12/05/2019    [provider]  Cholecalciferol (VITAMIN D3) 50 MCG (2000 UT) TABS Take 2,000 Units by mouth daily.  [provider]  diltiazem (DILT-XR) 240 MG 24 hr capsule Take 1 capsule (240 mg total) by mouth daily. 04/13/19   Binnie Rail, MD  famotidine (PEPCID) 40 MG tablet Take 1 tablet (40 mg total) by mouth every evening. Patient not taking: Reported on 12/05/2019 04/16/19   Binnie Rail, MD  lisinopril-hydrochlorothiazide (ZESTORETIC) 20-25 MG tablet Take 1 tablet by mouth daily. 04/13/19   Binnie Rail, MD  Multiple Vitamin (MULTIVITAMIN) tablet Take 1 tablet by mouth daily. Patient not taking: Reported on 12/05/2019    [provider]    Allergies    Patient has no known allergies.  Review of Systems   Review of Systems  Gastrointestinal: Positive for abdominal pain.  All other systems reviewed and are  negative.   Physical Exam Updated Vital Signs BP 137/69   Pulse 61   Temp 98.2 F (36.8 C) (Oral)   Resp 16   Ht 5\' 7"  (1.702 m)   Wt 70.3 kg   SpO2 97%   BMI 24.27 kg/m   Physical Exam Vitals and nursing note reviewed.  Constitutional:      General: She is not in acute distress.    Appearance: She is well-developed.  HENT:     Head: Atraumatic.  Eyes:     Conjunctiva/sclera: Conjunctivae normal.  Cardiovascular:     Rate and Rhythm: Normal rate and regular rhythm.  Pulmonary:     Effort: Pulmonary effort is normal.     Breath sounds: Normal breath sounds.  Abdominal:     General: Abdomen is flat.     Palpations: Abdomen is soft.     Tenderness: There is abdominal tenderness (Very mild left lower quadrant tenderness without guarding or rebound tenderness.) in the left lower quadrant.  Genitourinary:    CommentsMarcie Bal, NT, available as chaperone.  Nonthrombosed external hemorrhoid noted, normal rectal tone, no obvious mass, small bowel) blood noted on glove, fecal occult blood test positive.  No impacted stool. Musculoskeletal:     Cervical back: Neck supple.  Skin:    Findings: No rash.  Neurological:     Mental Status: She is alert.     ED Results / Procedures / Treatments   Labs (all labs ordered are listed, but only abnormal results are displayed) Labs Reviewed  COMPREHENSIVE METABOLIC PANEL - Abnormal; Notable for the following components:      Result Value   Potassium 3.2 (*)    Glucose, Bld 131 (*)    All other components within normal limits  URINALYSIS, ROUTINE W REFLEX MICROSCOPIC - Abnormal; Notable for the following components:   APPearance HAZY (*)    Ketones, ur 5 (*)    Leukocytes,Ua LARGE (*)    Bacteria, UA RARE (*)    Non Squamous Epithelial 0-5 (*)    All other components within normal limits  POC OCCULT BLOOD, ED - Abnormal; Notable for the following components:   Fecal Occult Bld POSITIVE (*)    All other components within normal  limits  LIPASE, BLOOD  CBC    EKG None  Radiology CT ABDOMEN PELVIS W CONTRAST  Result Date: 01/03/2020 CLINICAL DATA:  74 year old female with history of lower abdominal pain. Suspected diverticulitis. EXAM: CT ABDOMEN AND PELVIS WITH CONTRAST TECHNIQUE: Multidetector CT imaging of the abdomen and pelvis was performed using the standard protocol following bolus administration of intravenous contrast. CONTRAST:  122mL OMNIPAQUE IOHEXOL 300 MG/ML  SOLN COMPARISON:  No priors. FINDINGS: Lower chest: Aortic atherosclerosis. Hepatobiliary: Mild  diffuse low attenuation throughout the hepatic parenchyma, suggestive of hepatic steatosis (difficult to confirm on today's contrast enhanced examination). Liver has a slightly shrunken appearance and nodular contour, suggesting early changes of cirrhosis. No discrete cystic or solid hepatic lesions. No intra or extrahepatic biliary ductal dilatation. Gallbladder is normal in appearance. Pancreas: No pancreatic mass. No pancreatic ductal dilatation. No pancreatic or peripancreatic fluid collections or inflammatory changes. Spleen: Unremarkable. Adrenals/Urinary Tract: Bilateral kidneys and adrenal glands are normal in appearance. No hydroureteronephrosis. Urinary bladder is normal in appearance. Stomach/Bowel: Normal appearance of the stomach. No pathologic dilatation of small bowel or colon. Numerous colonic diverticulae are noted, particularly in the descending colon and sigmoid colon, where there are some subtle surrounding inflammatory changes the ridge could suggest very early or mild acute diverticulitis. No discrete diverticular abscess or signs of frank perforation at this time. Normal appendix. Vascular/Lymphatic: Aortic atherosclerosis, without evidence of aneurysm or dissection in the abdominal or pelvic vasculature. No lymphadenopathy noted in the abdomen or pelvis. Reproductive: Large calcified or ossified structure in the posterior aspect of the pelvis  (axial image 64 of series 3 and sagittal image 84 of series 7) measuring 5.0 x 4.3 x 4.6 cm, most compatible with a large fibroid in the uterine fundus, in a retroflexed uterus. Ovaries are atrophic. Other: Trace volume of ascites most evident in the low anatomic pelvis. No pneumoperitoneum. Musculoskeletal: There are no aggressive appearing lytic or blastic lesions noted in the visualized portions of the skeleton. IMPRESSION: 1. Findings suggest early or mild acute diverticulitis in the distal descending colon and proximal sigmoid colon. No diverticular abscess or signs of frank perforation are noted at this time. 2. Morphologic changes in the liver indicative of early cirrhosis with probable hepatic steatosis. 3. Trace volume of ascites. 4. Fibroid uterus, as above. 5. Aortic atherosclerosis. Electronically Signed   By: Vinnie Langton M.D.   On: 01/03/2020 10:17    Procedures Procedures (including critical care time)  Medications Ordered in ED Medications  oxyCODONE-acetaminophen (PERCOCET/ROXICET) 5-325 MG per tablet 1 tablet (1 tablet Oral Given 01/03/20 0845)  iohexol (OMNIPAQUE) 300 MG/ML solution 100 mL (100 mLs Intravenous Contrast Given 01/03/20 0959)    ED Course  I have reviewed the triage vital signs and the nursing notes.  Pertinent labs & imaging results that were available during my care of the patient were reviewed by me and considered in my medical decision making (see chart for details).    MDM Rules/Calculators/A&P                          BP (!) 143/66   Pulse 83   Temp 98.2 F (36.8 C) (Oral)   Resp 16   Ht 5\' 7"  (1.702 m)   Wt 70.3 kg   SpO2 99%   BMI 24.27 kg/m   Final Clinical Impression(s) / ED Diagnoses Final diagnoses:  Acute diverticulitis    Rx / DC Orders ED Discharge Orders         Ordered    amoxicillin-clavulanate (AUGMENTIN) 875-125 MG tablet  2 times daily        01/03/20 1041    oxyCODONE-acetaminophen (PERCOCET) 5-325 MG tablet  Every 6  hours PRN        01/03/20 1041         8:00 AM Patient here with bright red blood per rectum as well as intermittent left lower quadrant abdominal pain for the past several days.  She has  a fairly benign abdominal exam however her symptom is suggestive of diverticular bleed.  No known history of active cancer.  Given her age, will obtain abdominal pelvic CT scan for further evaluation.  10:23 AM Labs are reassuring.  UA shows large leukocyte esterase, 21-50 WBC as well as rare bacteria but nitrite negative.  Patient did not have any urinary symptoms therefore I suspect this is not likely a UTI.  Abdominal pelvis CT scan demonstrate early mild acute diverticulitis the distal descending colon and proximal sigmoid colon.  No diverticular abscess or frank perforation noted.    I discussed this finding with patient.  Will discharge home with antibiotic.  Encourage patient to follow-up outpatient with her GI specialist if needed.  Return precaution discussed.  Patient also requesting pain medication to go home as it has helped her in the ED.  Will provide a short course of opiate pain medication.  Patient made aware that medication can cause drowsiness.   Domenic Moras, PA-C 01/03/20 1043    Lucrezia Starch, MD 01/03/20 1537

## 2020-01-08 ENCOUNTER — Telehealth: Payer: Self-pay | Admitting: Internal Medicine

## 2020-01-08 DIAGNOSIS — K5792 Diverticulitis of intestine, part unspecified, without perforation or abscess without bleeding: Secondary | ICD-10-CM

## 2020-01-08 NOTE — Telephone Encounter (Signed)
Patient calling states had seen GI and she was supposed to get a colonoscopy in December 2020 but her doctor postponed to December 2021 and now to December 2022 because she was having no complications. Patient was now seen last week in ER for diverticulitis and called her insurance to see if she could get a colonoscopy now and they said no unless her PCP recommends her to get one. Patient wondering if that is something we could do over the phone or an in person visit.

## 2020-01-08 NOTE — Telephone Encounter (Signed)
Her last colonoscopy did show evidence of moderate diverticulosis.  Her CT scan from the emergency room showed possible liver cirrhosis.  I will refer her to GI and she can discuss with them if she should have a colonoscopy or not and I would like them to further evaluate her liver.  They will call her to schedule.

## 2020-01-10 NOTE — Telephone Encounter (Signed)
Patient giving a call back after receiving a VM from [place]. Patient would like a call back to speak with Angela Nevin

## 2020-01-10 NOTE — Telephone Encounter (Signed)
Called again and left messages on both numbers listed in chart. If she returns call okay to give her Dr. Quay Burow recommendations. I will also send her a my-chart message as well with Dr. Eilleen Kempf recommendations.

## 2020-02-08 ENCOUNTER — Encounter: Payer: Self-pay | Admitting: Gastroenterology

## 2020-03-13 DIAGNOSIS — H524 Presbyopia: Secondary | ICD-10-CM | POA: Diagnosis not present

## 2020-03-18 DIAGNOSIS — R69 Illness, unspecified: Secondary | ICD-10-CM | POA: Diagnosis not present

## 2020-04-02 ENCOUNTER — Encounter: Payer: Self-pay | Admitting: Gastroenterology

## 2020-04-02 ENCOUNTER — Ambulatory Visit: Payer: Medicare HMO | Admitting: Gastroenterology

## 2020-04-02 VITALS — BP 116/64 | HR 68 | Ht 66.0 in | Wt 142.1 lb

## 2020-04-02 DIAGNOSIS — R932 Abnormal findings on diagnostic imaging of liver and biliary tract: Secondary | ICD-10-CM | POA: Diagnosis not present

## 2020-04-02 DIAGNOSIS — R933 Abnormal findings on diagnostic imaging of other parts of digestive tract: Secondary | ICD-10-CM

## 2020-04-02 DIAGNOSIS — K5732 Diverticulitis of large intestine without perforation or abscess without bleeding: Secondary | ICD-10-CM | POA: Diagnosis not present

## 2020-04-02 MED ORDER — NA SULFATE-K SULFATE-MG SULF 17.5-3.13-1.6 GM/177ML PO SOLN: 1.0000 | Freq: Once | ORAL | 0 refills | Status: AC

## 2020-04-02 NOTE — Progress Notes (Signed)
History of Present Illness: This is 75 year old female a referred by Binnie Rail, MD for the evaluation of diverticulitis, abnormal CT AP, Hemoccult positive stool.  She developed acute left lower quadrant pain in October 2021 and presented to the ED for evaluation.  CT AP showed left colon diverticulosis and inflammatory changes near the junction of the sigmoid and descending colon.  CT also showed probable hepatic steatosis and a slightly smaller appearance and nodular contour to the liver.  Hemoccult positive stool was documented in the ED.  Prior colonoscopy as below.  Her left lower quadrant pain resolved after course of antibiotics and she has had no further problems.  She is taking Metamucil daily and having regular bowel movements without straining.  She has no current gastrointestinal complaints. Denies weight loss, abdominal pain, constipation, diarrhea, change in stool caliber, melena, hematochezia, nausea, vomiting, dysphagia, reflux symptoms, chest pain.    Colonoscopy 02/2014 1. Sessile polyp in the sigmoid colon; polypectomy performed with a cold snare (hyperplastic) 2. Moderate diverticulosis in the sigmoid colon and descending colon 3. Grade l internal hemorrhoids   No Known Allergies Outpatient Medications Prior to Visit  Medication Sig Dispense Refill  . alendronate (FOSAMAX) 70 MG tablet Take 1 tablet (70 mg total) by mouth every 7 (seven) days. Take with a full glass of water on an empty stomach. 12 tablet 3  . Cholecalciferol (VITAMIN D3) 50 MCG (2000 UT) TABS Take 2,000 Units by mouth daily.    [name] diltiazem (DILT-XR) 240 MG 24 hr capsule Take 1 capsule (240 mg total) by mouth daily. 90 capsule 3  . lisinopril-hydrochlorothiazide (ZESTORETIC) 20-25 MG tablet Take 1 tablet by mouth daily. 90 tablet 3  . amoxicillin-clavulanate (AUGMENTIN) 875-125 MG tablet Take 1 tablet by mouth 2 (two) times daily. One po bid x 7 days 14 tablet 0  . Calcium Carbonate-Vitamin D  (CALCIUM-D PO) Take 1 tablet by mouth. (Patient not taking: Reported on 12/05/2019)    . famotidine (PEPCID) 40 MG tablet Take 1 tablet (40 mg total) by mouth every evening. (Patient not taking: Reported on 12/05/2019) 30 tablet 1  . Multiple Vitamin (MULTIVITAMIN) tablet Take 1 tablet by mouth daily. (Patient not taking: Reported on 12/05/2019)    . oxyCODONE-acetaminophen (PERCOCET) 5-325 MG tablet Take 1 tablet by mouth every 6 (six) hours as needed for moderate pain or severe pain. 5 tablet 0   No facility-administered medications prior to visit.   Past Medical History:  Diagnosis Date  . Colon polyps 02/2003, 02/2009   Adenomatous polyps  . Diverticulitis   . Diverticulosis   . Heart murmur   . Hypertension   . Osteoarthritis    hands  . Osteopenia    DEXA 02/2011, 02/2013: -1.9  . Pneumonia   . Prediabetes    Past Surgical History:  Procedure Laterality Date  . BREAST ENHANCEMENT SURGERY  1987  . COLONOSCOPY     Social History   Socioeconomic History  . Marital status: Married    Spouse name: Not on file  . Number of children: 2  . Years of education: Not on file  . Highest education level: Not on file  Occupational History  . Occupation: retired  Tobacco Use  . Smoking status: Never Smoker  . Smokeless tobacco: Never Used  Vaping Use  . Vaping Use: Never used  Substance and Sexual Activity  . Alcohol use: Not Currently    Comment: wine with dinner  . Drug use: No  .  Sexual activity: Not Currently  Other Topics Concern  . Not on file  Social History Narrative   Lives with her husband   Exercises regularly   Drinks 1 glass of wine with dinner   Social Determinants of Health   Financial Resource Strain: Low Risk   . Difficulty of Paying Living Expenses: Not hard at all  Food Insecurity: No Food Insecurity  . Worried About Charity fundraiser in the Last Year: Never true  . Ran Out of Food in the Last Year: Never true  Transportation Needs: No  Transportation Needs  . Lack of Transportation (Medical): No  . Lack of Transportation (Non-Medical): No  Physical Activity: Sufficiently Active  . Days of Exercise per Week: 7 days  . Minutes of Exercise per Session: 60 min  Stress: No Stress Concern Present  . Feeling of Stress : Not at all  Social Connections: Not on file   Family History  Problem Relation Age of Onset  . Dementia Brother   . Parkinson's disease Brother   . Cancer Mother        cancer of saliva glands  . Heart disease Other   . Heart attack Father   . Rheumatic fever Father   . Colon cancer Neg Hx   . Esophageal cancer Neg Hx   . Stomach cancer Neg Hx   . Rectal cancer Neg Hx       Review of Systems: Pertinent positive and negative review of systems were noted in the above HPI section. All other review of systems were otherwise negative.   Physical Exam: General: Well developed, well nourished, no acute distress Head: Normocephalic and atraumatic Eyes:  sclerae anicteric, EOMI Ears: Normal auditory acuity Mouth: Not examined, mask on during Covid-19 pandemic Neck: Supple, no masses or thyromegaly Lungs: Clear throughout to auscultation Heart: Regular rate and rhythm; no murmurs, rubs or bruits Abdomen: Soft, non tender and non distended. No masses, hepatosplenomegaly or hernias noted. Normal Bowel sounds Rectal: Deferred to colonoscopy.  Musculoskeletal: Symmetrical with no gross deformities  Skin: No lesions on visible extremities Pulses:  Normal pulses noted Extremities: No clubbing, cyanosis, edema or deformities noted Neurological: Alert oriented x 4, grossly nonfocal Cervical Nodes:  No significant cervical adenopathy Inguinal Nodes: No significant inguinal adenopathy Psychological:  Alert and cooperative. Normal mood and affect   Assessment and Recommendations:  1. Left colon diverticulitis in October with an abnormal CT of left colon.  Hemoccult positive stool.  Rule out possibility of a  colorectal neoplasm or other inflammatory process causing diverticulitis and CT findings.  Long-term she is advised to maintain a high-fiber diet with a daily fiber supplement and adequate daily water intake.  Schedule colonoscopy. The risks (including bleeding, perforation, infection, missed lesions, medication reactions and possible hospitalization or surgery if complications occur), benefits, and alternatives to colonoscopy with possible biopsy and possible polypectomy were discussed with the patient and they consent to proceed.   2. CT AP showed possible hepatic steatosis and an abnormal slightly small appearance with a nodular contour.  No prior hepatic imaging studies in Epic for comparison.  Her LFTs are normal. No thrombocytopenia.  No physical exam evidence of cirrhosis.  Monitor blood work per her PCP.  Consider further evaluation an RUQ [place].   cc: Binnie Rail, MD [address] [place],  [place] [address]

## 2020-04-02 NOTE — Patient Instructions (Signed)
You have been scheduled for a colonoscopy. Please follow written instructions given to you at your visit today.  Please pick up your prep supplies at the pharmacy within the next 1-3 days. If you use inhalers (even only as needed), please bring them with you on the day of your procedure.   Normal BMI (Body Mass Index- based on height and weight) is between 23 and 30. Your BMI today is Body mass index is 22.94 kg/m. [name] Please consider follow up  regarding your BMI with your Primary Care Provider.  Thank you for choosing me and [facility].  Pricilla Riffle. Dagoberto Ligas., MD., Marval Regal

## 2020-04-14 ENCOUNTER — Encounter: Payer: Medicare HMO | Admitting: Internal Medicine

## 2020-04-16 ENCOUNTER — Encounter: Payer: Self-pay | Admitting: Gastroenterology

## 2020-04-16 ENCOUNTER — Ambulatory Visit (AMBULATORY_SURGERY_CENTER): Payer: Medicare HMO | Admitting: Gastroenterology

## 2020-04-16 ENCOUNTER — Other Ambulatory Visit: Payer: Self-pay

## 2020-04-16 VITALS — BP 104/55 | HR 62 | Temp 96.8°F | Resp 17 | Ht 66.0 in | Wt 142.0 lb

## 2020-04-16 DIAGNOSIS — R195 Other fecal abnormalities: Secondary | ICD-10-CM

## 2020-04-16 DIAGNOSIS — K640 First degree hemorrhoids: Secondary | ICD-10-CM | POA: Diagnosis not present

## 2020-04-16 DIAGNOSIS — I10 Essential (primary) hypertension: Secondary | ICD-10-CM | POA: Diagnosis not present

## 2020-04-16 DIAGNOSIS — D123 Benign neoplasm of transverse colon: Secondary | ICD-10-CM

## 2020-04-16 DIAGNOSIS — R933 Abnormal findings on diagnostic imaging of other parts of digestive tract: Secondary | ICD-10-CM | POA: Diagnosis not present

## 2020-04-16 DIAGNOSIS — Z8719 Personal history of other diseases of the digestive system: Secondary | ICD-10-CM | POA: Diagnosis not present

## 2020-04-16 DIAGNOSIS — K6389 Other specified diseases of intestine: Secondary | ICD-10-CM | POA: Diagnosis not present

## 2020-04-16 DIAGNOSIS — K5732 Diverticulitis of large intestine without perforation or abscess without bleeding: Secondary | ICD-10-CM

## 2020-04-16 DIAGNOSIS — D122 Benign neoplasm of ascending colon: Secondary | ICD-10-CM

## 2020-04-16 DIAGNOSIS — K635 Polyp of colon: Secondary | ICD-10-CM | POA: Diagnosis not present

## 2020-04-16 MED ORDER — SODIUM CHLORIDE 0.9 % IV SOLN: 500.0000 mL | INTRAVENOUS | Status: DC

## 2020-04-16 NOTE — Op Note (Signed)
[facility] Patient Name: Sherry Mitchell Procedure Date: 04/16/2020 3:45 PM MRN: [id number] Endoscopist: Ladene Artist , MD Age: 75 Referring MD:  Date of Birth: 1946-03-11 Gender: Female Account #: [id number] Procedure:                Colonoscopy Indications:              Heme positive stool, Abnormal CT of the GI tract                            (colon), Recent diverticulitis Medicines:                Monitored Anesthesia Care Procedure:                Pre-Anesthesia Assessment:                           - Prior to the procedure, a History and Physical                            was performed, and patient medications and                            allergies were reviewed. The patient's tolerance of                            previous anesthesia was also reviewed. The risks                            and benefits of the procedure and the sedation                            options and risks were discussed with the patient.                            All questions were answered, and informed consent                            was obtained. Prior Anticoagulants: The patient has                            taken no previous anticoagulant or antiplatelet                            agents. ASA Grade Assessment: II - A patient with                            mild systemic disease. After reviewing the risks                            and benefits, the patient was deemed in                            satisfactory condition to undergo the procedure.  After obtaining informed consent, the colonoscope                            was passed under direct vision. Throughout the                            procedure, the patient's blood pressure, pulse, and                            oxygen saturations were monitored continuously. The                            Olympus PCF-H190DL (#[id number]) Colonoscope was                            introduced through the anus and  advanced to the the                            cecum, identified by appendiceal orifice and                            ileocecal valve. The ileocecal valve, appendiceal                            orifice, and rectum were photographed. The quality                            of the bowel preparation was adequate after copious                            lavage and extensive suctioning. The colonoscopy                            was somewhat difficult due to inadequate bowel                            prep, significant looping and a tortuous colon. The                            patient tolerated the procedure well. Scope In: 3:55:05 PM Scope Out: 4:25:38 PM Scope Withdrawal Time: 0 hours 27 minutes 12 seconds  Total Procedure Duration: 0 hours 30 minutes 33 seconds  Findings:                 Skin tags were found on perianal exam.                           Four sessile polyps were found in the transverse                            colon (2) and ascending colon (2). The polyps were                            5 to 8 mm in size.  These polyps were removed with a                            cold snare. Resection and retrieval were complete.                           Multiple medium-mouthed diverticula were found in                            the left colon. There was evidence of diverticular                            spasm. There was no evidence of diverticular                            bleeding.                           A segmental area of mild melanosis was found in the                            sigmoid colon and in the descending colon.                           Internal hemorrhoids were found during                            retroflexion. The hemorrhoids were moderate and                            Grade I (internal hemorrhoids that do not prolapse).                           The exam was otherwise without abnormality on                            direct and retroflexion  views. Complications:            No immediate complications. Estimated blood loss:                            None. Estimated Blood Loss:     Estimated blood loss: none. Impression:               - Perianal skin tags found on perianal exam.                           - Four 5 to 8 mm polyps in the transverse colon and                            in the ascending colon, removed with a cold snare.                            Resected and retrieved.                           -  Moderate diverticulosis in the left colon.                           - Mild melanosis in the colon.                           - Internal hemorrhoids.                           - The examination was otherwise normal on direct                            and retroflexion views. Recommendation:           - Repeat colonoscopy after studies are complete, vs                            no repeat colonoscopy due to age, for surveillance                            based on pathology results with a more extensive                            bowel prep.                           - Patient has a contact number available for                            emergencies. The signs and symptoms of potential                            delayed complications were discussed with the                            patient. Return to normal activities tomorrow.                            Written discharge instructions were provided to the                            patient.                           - High fiber diet with adequate daily water intake.                           - Continue present medications.                           - Await pathology results. Ladene Artist, MD 04/16/2020 4:34:38 PM This report has been signed electronically.

## 2020-04-16 NOTE — Patient Instructions (Signed)
Please, read all of the handouts given to you by your recovery room nurse.  YOU HAD AN ENDOSCOPIC PROCEDURE TODAY AT THE [facility]:   Refer to the procedure report that was given to you for any specific questions about what was found during the examination.  If the procedure report does not answer your questions, please call your gastroenterologist to clarify.  If you requested that your care partner not be given the details of your procedure findings, then the procedure report has been included in a sealed envelope for you to review at your convenience later.  YOU SHOULD EXPECT: Some feelings of bloating in the abdomen. Passage of more gas than usual.  Walking can help get rid of the air that was put into your GI tract during the procedure and reduce the bloating. If you had a lower endoscopy (such as a colonoscopy or flexible sigmoidoscopy) you may notice spotting of blood in your stool or on the toilet paper. If you underwent a bowel prep for your procedure, you may not have a normal bowel movement for a few days.  Please Note:  You might notice some irritation and congestion in your nose or some drainage.  This is from the oxygen used during your procedure.  There is no need for concern and it should clear up in a day or so.  SYMPTOMS TO REPORT IMMEDIATELY:  Following lower endoscopy (colonoscopy or flexible sigmoidoscopy):  Excessive amounts of blood in the stool  Significant tenderness or worsening of abdominal pains  Swelling of the abdomen that is new, acute  Fever of 100F or higher   For urgent or emergent issues, a gastroenterologist can be reached at any hour by calling [phone]. Do not use MyChart messaging for urgent concerns.    DIET:  We do recommend a small meal at first, but then you may proceed to your regular diet.  Drink plenty of fluids but you should avoid alcoholic beverages for 24 hours.  Try to increase the fiber in your diet, and drink plenty of  water.  ACTIVITY:  You should plan to take it easy for the rest of today and you should NOT DRIVE or use heavy machinery until tomorrow (because of the sedation medicines used during the test).    FOLLOW UP: Our staff will call the number listed on your records 48-72 hours following your procedure to check on you and address any questions or concerns that you may have regarding the information given to you following your procedure. If we do not reach you, we will leave a message.  We will attempt to reach you two times.  During this call, we will ask if you have developed any symptoms of COVID 19. If you develop any symptoms (ie: fever, flu-like symptoms, shortness of breath, cough etc.) before then, please call [phone].  If you test positive for Covid 19 in the 2 weeks post procedure, please call and report this information to us.    If any biopsies were taken you will be contacted by phone or by letter within the next 1-3 weeks.  Please call us at [phone] if you have not heard about the biopsies in 3 weeks.    SIGNATURES/CONFIDENTIALITY: You and/or your care partner have signed paperwork which will be entered into your electronic medical record.  These signatures attest to the fact that that the information above on your After Visit Summary has been reviewed and is understood.  Full responsibility of the confidentiality   of this discharge information lies with you and/or your care-partner.

## 2020-04-16 NOTE — Progress Notes (Signed)
Report to PACU, RN, vss, BBS= Clear.  

## 2020-04-16 NOTE — Progress Notes (Signed)
Pt's states no medical or surgical changes since previsit or office visit. 

## 2020-04-16 NOTE — Progress Notes (Signed)
Called to room to assist during endoscopic procedure.  Patient ID and intended procedure confirmed with present staff. Received instructions for my participation in the procedure from the performing physician.  

## 2020-04-18 ENCOUNTER — Telehealth: Payer: Self-pay

## 2020-04-18 NOTE — Telephone Encounter (Signed)
  Follow up Call-  Call back number 04/16/2020  Post procedure Call Back phone  # [phone]  Permission to leave phone message Yes  Some recent data might be hidden     Patient questions:  Do you have a fever, pain , or abdominal swelling? No. Pain Score  0 *  Have you tolerated food without any problems? Yes.    Have you been able to return to your normal activities? Yes.    Do you have any questions about your discharge instructions: Diet   No. Medications  No. Follow up visit  No.  Do you have questions or concerns about your Care? No.  Actions: * If pain score is 4 or above: No action needed, pain <4.   1. Have you developed a fever since your procedure? no  2.   Have you had an respiratory symptoms (SOB or cough) since your procedure? no  3.   Have you tested positive for COVID 19 since your procedure no  4.   Have you had any family members/close contacts diagnosed with the COVID 19 since your procedure?  no   If yes to any of these questions please route to Joylene John, RN and Joella Prince, RN

## 2020-04-21 NOTE — Patient Instructions (Addendum)
Blood work was ordered.     Medications changes include :   none  Your prescription(s) have been submitted to your pharmacy. Please take as directed and contact our office if you believe you are having problem(s) with the medication(s).    Please followup in 1 year   Health Maintenance, Female Adopting a healthy lifestyle and getting preventive care are important in promoting health and wellness. Ask your health care provider about:  The right schedule for you to have regular tests and exams.  Things you can do on your own to prevent diseases and keep yourself healthy. What should I know about diet, weight, and exercise? Eat a healthy diet  Eat a diet that includes plenty of vegetables, fruits, low-fat dairy products, and lean protein.  Do not eat a lot of foods that are high in solid fats, added sugars, or sodium.   Maintain a healthy weight Body mass index (BMI) is used to identify weight problems. It estimates body fat based on height and weight. Your health care provider can help determine your BMI and help you achieve or maintain a healthy weight. Get regular exercise Get regular exercise. This is one of the most important things you can do for your health. Most adults should:  Exercise for at least 150 minutes each week. The exercise should increase your heart rate and make you sweat (moderate-intensity exercise).  Do strengthening exercises at least twice a week. This is in addition to the moderate-intensity exercise.  Spend less time sitting. Even light physical activity can be beneficial. Watch cholesterol and blood lipids Have your blood tested for lipids and cholesterol at 75 years of age, then have this test every 5 years. Have your cholesterol levels checked more often if:  Your lipid or cholesterol levels are high.  You are older than 75 years of age.  You are at high risk for heart disease. What should I know about cancer screening? Depending on your  health history and family history, you may need to have cancer screening at various ages. This may include screening for:  Breast cancer.  Cervical cancer.  Colorectal cancer.  Skin cancer.  Lung cancer. What should I know about heart disease, diabetes, and high blood pressure? Blood pressure and heart disease  High blood pressure causes heart disease and increases the risk of stroke. This is more likely to develop in people who have high blood pressure readings, are of African descent, or are overweight.  Have your blood pressure checked: ? Every 3-5 years if you are 18-39 years of age. ? Every year if you are 40 years old or older. Diabetes Have regular diabetes screenings. This checks your fasting blood sugar level. Have the screening done:  Once every three years after age 40 if you are at a normal weight and have a low risk for diabetes.  More often and at a younger age if you are overweight or have a high risk for diabetes. What should I know about preventing infection? Hepatitis B If you have a higher risk for hepatitis B, you should be screened for this virus. Talk with your health care provider to find out if you are at risk for hepatitis B infection. Hepatitis C Testing is recommended for:  Everyone born from 1945 through 1965.  Anyone with known risk factors for hepatitis C. Sexually transmitted infections (STIs)  Get screened for STIs, including gonorrhea and chlamydia, if: ? You are sexually active and are younger than 75 years of age. ?   age. ? You are older than 75 years of age and your health care provider tells you that you are at risk for this type of infection. ? Your sexual activity has changed since you were last screened, and you are at increased risk for chlamydia or gonorrhea. Ask your health care provider if you are at risk.  Ask your health care provider about whether you are at high risk for HIV. Your health care provider may recommend a prescription  medicine to help prevent HIV infection. If you choose to take medicine to prevent HIV, you should first get tested for HIV. You should then be tested every 3 months for as long as you are taking the medicine. Pregnancy  If you are about to stop having your period (premenopausal) and you may become pregnant, seek counseling before you get pregnant.  Take 400 to 800 micrograms (mcg) of folic acid every day if you become pregnant.  Ask for birth control (contraception) if you want to prevent pregnancy. Osteoporosis and menopause Osteoporosis is a disease in which the bones lose minerals and strength with aging. This can result in bone fractures. If you are 65 years old or older, or if you are at risk for osteoporosis and fractures, ask your health care provider if you should:  Be screened for bone loss.  Take a calcium or vitamin D supplement to lower your risk of fractures.  Be given hormone replacement therapy (HRT) to treat symptoms of menopause. Follow these instructions at home: Lifestyle  Do not use any products that contain nicotine or tobacco, such as cigarettes, e-cigarettes, and chewing tobacco. If you need help quitting, ask your health care provider.  Do not use street drugs.  Do not share needles.  Ask your health care provider for help if you need support or information about quitting drugs. Alcohol use  Do not drink alcohol if: ? Your health care provider tells you not to drink. ? You are pregnant, may be pregnant, or are planning to become pregnant.  If you drink alcohol: ? Limit how much you use to 0-1 drink a day. ? Limit intake if you are breastfeeding.  Be aware of how much alcohol is in your drink. In the [place] one drink equals one 12 oz bottle of beer (355 mL), one 5 oz glass of wine (148 mL), or one 1 oz glass of hard liquor (44 mL). General instructions  Schedule regular health, dental, and eye exams.  Stay current with your vaccines.  Tell your health  care provider if: ? You often feel depressed. ? You have ever been abused or do not feel safe at home. Summary  Adopting a healthy lifestyle and getting preventive care are important in promoting health and wellness.  Follow your health care provider's instructions about healthy diet, exercising, and getting tested or screened for diseases.  Follow your health care provider's instructions on monitoring your cholesterol and blood pressure. This information is not intended to replace advice given to you by your health care provider. Make sure you discuss any questions you have with your health care provider. Document Revised: 03/01/2018 Document Reviewed: 03/01/2018 Elsevier Patient Education  2021 Elsevier Inc.  

## 2020-04-21 NOTE — Progress Notes (Signed)
Subjective:    Patient ID: Sherry Mitchell, female    DOB: 05-21-1945, 75 y.o.   MRN: [id number]   This visit occurred during the SARS-CoV-2 public health emergency.  Safety protocols were in place, including screening questions prior to the visit, additional usage of staff PPE, and extensive cleaning of exam room while observing appropriate contact time as indicated for disinfecting solutions.    HPI She is here for a physical exam.   She does not sleep well.  She gets up twice to urinate.  She often can get back to sleep.  Sometimes she can not fall asleep for 2-3 hours.  She limits her fluids at night.  She tried melatonin and it did not work.    She has no other concerns.   Medications and allergies reviewed with patient and updated if appropriate.  Patient Active Problem List   Diagnosis Date Noted  . Chest tightness 04/16/2019  . Hyperlipidemia 04/12/2018  . Trigger finger 04/11/2018  . Prediabetes 04/09/2018  . Neck pain on right side 05/25/2017  . Osteoporosis 04/24/2016  . Osteoarthritis   . Hypertension   . Colon polyps     Current Outpatient Medications on File Prior to Visit  Medication Sig Dispense Refill  . alendronate (FOSAMAX) 70 MG tablet Take 1 tablet (70 mg total) by mouth every 7 (seven) days. Take with a full glass of water on an empty stomach. 12 tablet 3  . Cholecalciferol (VITAMIN D3) 50 MCG (2000 UT) TABS Take 2,000 Units by mouth daily.    [name] diltiazem (DILT-XR) 240 MG 24 hr capsule Take 1 capsule (240 mg total) by mouth daily. 90 capsule 3  . lisinopril-hydrochlorothiazide (ZESTORETIC) 20-25 MG tablet Take 1 tablet by mouth daily. 90 tablet 3   No current facility-administered medications on file prior to visit.    Past Medical History:  Diagnosis Date  . Colon polyps 02/2003, 02/2009   Adenomatous polyps  . Diverticulitis   . Diverticulosis   . Heart murmur   . Hypertension   . Osteoarthritis    hands  . Osteopenia    DEXA 02/2011,  02/2013: -1.9  . Pneumonia   . Prediabetes     Past Surgical History:  Procedure Laterality Date  . BREAST ENHANCEMENT SURGERY  1987  . COLONOSCOPY      Social History   Socioeconomic History  . Marital status: Married    Spouse name: Not on file  . Number of children: 2  . Years of education: Not on file  . Highest education level: Not on file  Occupational History  . Occupation: retired  Tobacco Use  . Smoking status: Never Smoker  . Smokeless tobacco: Never Used  Vaping Use  . Vaping Use: Never used  Substance and Sexual Activity  . Alcohol use: Not Currently    Comment: wine with dinner  . Drug use: No  . Sexual activity: Not Currently  Other Topics Concern  . Not on file  Social History Narrative   Lives with her husband   Exercises regularly   Drinks 1 glass of wine with dinner   Social Determinants of Health   Financial Resource Strain: Low Risk   . Difficulty of Paying Living Expenses: Not hard at all  Food Insecurity: No Food Insecurity  . Worried About Charity fundraiser in the Last Year: Never true  . Ran Out of Food in the Last Year: Never true  Transportation Needs: No Transportation Needs  .  Lack of Transportation (Medical): No  . Lack of Transportation (Non-Medical): No  Physical Activity: Sufficiently Active  . Days of Exercise per Week: 7 days  . Minutes of Exercise per Session: 60 min  Stress: No Stress Concern Present  . Feeling of Stress : Not at all  Social Connections: Not on file    Family History  Problem Relation Age of Onset  . Dementia Brother   . Parkinson's disease Brother   . Cancer Mother        cancer of saliva glands  . Heart disease Other   . Heart attack Father   . Rheumatic fever Father   . Colon cancer Neg Hx   . Esophageal cancer Neg Hx   . Stomach cancer Neg Hx   . Rectal cancer Neg Hx     Review of Systems  Constitutional: Negative for chills and fever.  Eyes: Negative for visual disturbance.   Respiratory: Negative for cough, shortness of breath and wheezing.   Cardiovascular: Negative for chest pain, palpitations and leg swelling.  Gastrointestinal: Negative for abdominal pain, blood in stool, constipation, diarrhea and nausea.       No gerd  Genitourinary: Negative for dysuria, frequency and hematuria.  Musculoskeletal: Positive for arthralgias (knees, hands) and back pain (lower back wih cooking long periods).  Skin: Negative for color change and rash.  Neurological: Negative for dizziness, light-headedness and headaches.  Psychiatric/Behavioral: Negative for dysphoric mood. The patient is not nervous/anxious.        Objective:   Vitals:   04/22/20 1108  BP: 112/72  Pulse: 60  Temp: 98 F (36.7 C)  SpO2: 99%   Filed Weights   04/22/20 1108  Weight: 138 lb 9.6 oz (62.9 kg)   Body mass index is 22.37 kg/m.  BP Readings from Last 3 Encounters:  04/22/20 112/72  04/16/20 (!) 104/55  04/02/20 116/64    Wt Readings from Last 3 Encounters:  04/22/20 138 lb 9.6 oz (62.9 kg)  04/16/20 142 lb (64.4 kg)  04/02/20 142 lb 2 oz (64.5 kg)     Physical Exam Constitutional: She appears well-developed and well-nourished. No distress.  HENT:  Head: Normocephalic and atraumatic.  Right Ear: External ear normal. Normal ear canal and TM Left Ear: External ear normal.  Normal ear canal and TM Mouth/Throat: Oropharynx is clear and moist.  Eyes: Conjunctivae and EOM are normal.  Neck: Neck supple. No tracheal deviation present. No thyromegaly present.  No carotid bruit  Cardiovascular: Normal rate, regular rhythm and normal heart sounds.   No murmur heard.  No edema. Pulmonary/Chest: Effort normal and breath sounds normal. No respiratory distress. She has no wheezes. She has no rales.  Breast: deferred   Abdominal: Soft. She exhibits no distension. There is no tenderness.  Lymphadenopathy: She has no cervical adenopathy.  Skin: Skin is warm and dry. She is not  diaphoretic.  Psychiatric: She has a normal mood and affect. Her behavior is normal.        Assessment & Plan:   Physical exam: Screening blood work    ordered Immunizations  had Covid booster  Colonoscopy  Up to date  Mammogram  Due - will schedule Gyn   Dr Benjie Karvonen -  Up to date  Dexa  Will do next year  Eye exams  Up to date  Exercise  Gets 10,000 feet a day Weight  normal Substance abuse   None  Sees derm annually      See Problem List for  Assessment and Plan of chronic medical problems.

## 2020-04-22 ENCOUNTER — Encounter: Payer: Self-pay | Admitting: Internal Medicine

## 2020-04-22 ENCOUNTER — Ambulatory Visit (INDEPENDENT_AMBULATORY_CARE_PROVIDER_SITE_OTHER): Payer: Medicare HMO | Admitting: Internal Medicine

## 2020-04-22 ENCOUNTER — Other Ambulatory Visit: Payer: Self-pay

## 2020-04-22 VITALS — BP 112/72 | HR 60 | Temp 98.0°F | Ht 66.0 in | Wt 138.6 lb

## 2020-04-22 DIAGNOSIS — E7849 Other hyperlipidemia: Secondary | ICD-10-CM | POA: Diagnosis not present

## 2020-04-22 DIAGNOSIS — Z0000 Encounter for general adult medical examination without abnormal findings: Secondary | ICD-10-CM | POA: Diagnosis not present

## 2020-04-22 DIAGNOSIS — I10 Essential (primary) hypertension: Secondary | ICD-10-CM | POA: Diagnosis not present

## 2020-04-22 DIAGNOSIS — M810 Age-related osteoporosis without current pathological fracture: Secondary | ICD-10-CM | POA: Diagnosis not present

## 2020-04-22 DIAGNOSIS — R7303 Prediabetes: Secondary | ICD-10-CM

## 2020-04-22 LAB — CBC WITH DIFFERENTIAL/PLATELET
Basophils Absolute: 0 10*3/uL (ref 0.0–0.1)
Basophils Relative: 0.7 % (ref 0.0–3.0)
Eosinophils Absolute: 0.1 10*3/uL (ref 0.0–0.7)
Eosinophils Relative: 2.3 % (ref 0.0–5.0)
HCT: 41.6 % (ref 36.0–46.0)
Hemoglobin: 14 g/dL (ref 12.0–15.0)
Lymphocytes Relative: 44.6 % (ref 12.0–46.0)
Lymphs Abs: 2.7 10*3/uL (ref 0.7–4.0)
MCHC: 33.6 g/dL (ref 30.0–36.0)
MCV: 89.2 fl (ref 78.0–100.0)
Monocytes Absolute: 0.7 10*3/uL (ref 0.1–1.0)
Monocytes Relative: 11.1 % (ref 3.0–12.0)
Neutro Abs: 2.5 10*3/uL (ref 1.4–7.7)
Neutrophils Relative %: 41.3 % — ABNORMAL LOW (ref 43.0–77.0)
Platelets: 308 10*3/uL (ref 150.0–400.0)
RBC: 4.66 Mil/uL (ref 3.87–5.11)
RDW: 13.5 % (ref 11.5–15.5)
WBC: 6.1 10*3/uL (ref 4.0–10.5)

## 2020-04-22 LAB — COMPREHENSIVE METABOLIC PANEL
ALT: 17 U/L (ref 0–35)
AST: 16 U/L (ref 0–37)
Albumin: 4.6 g/dL (ref 3.5–5.2)
Alkaline Phosphatase: 60 U/L (ref 39–117)
BUN: 13 mg/dL (ref 6–23)
CO2: 31 mEq/L (ref 19–32)
Calcium: 10.3 mg/dL (ref 8.4–10.5)
Chloride: 100 mEq/L (ref 96–112)
Creatinine, Ser: 0.63 mg/dL (ref 0.40–1.20)
GFR: 87.21 mL/min (ref >60.00)
Glucose, Bld: 99 mg/dL (ref 70–99)
Potassium: 4.1 mEq/L (ref 3.5–5.1)
Sodium: 136 mEq/L (ref 135–145)
Total Bilirubin: 0.8 mg/dL (ref 0.2–1.2)
Total Protein: 7.7 g/dL (ref 6.0–8.3)

## 2020-04-22 LAB — LIPID PANEL
Cholesterol: 231 mg/dL — ABNORMAL HIGH (ref 0–200)
HDL: 62.7 mg/dL (ref >39.00)
LDL Cholesterol: 150 mg/dL — ABNORMAL HIGH (ref 0–99)
NonHDL: 167.93
Total CHOL/HDL Ratio: 4
Triglycerides: 92 mg/dL (ref 0.0–149.0)
VLDL: 18.4 mg/dL (ref 0.0–40.0)

## 2020-04-22 LAB — TSH: TSH: 0.93 u[IU]/mL (ref 0.35–4.50)

## 2020-04-22 LAB — VITAMIN D 25 HYDROXY (VIT D DEFICIENCY, FRACTURES): VITD: 35.97 ng/mL (ref 30.00–100.00)

## 2020-04-22 LAB — HEMOGLOBIN A1C: Hgb A1c MFr Bld: 6.2 % (ref 4.6–6.5)

## 2020-04-22 MED ORDER — DILTIAZEM HCL ER 240 MG PO CP24
240.0000 mg | ORAL_CAPSULE | Freq: Every day | ORAL | 3 refills | Status: DC
Start: 2020-04-22 — End: 2021-04-24

## 2020-04-22 MED ORDER — ALENDRONATE SODIUM 70 MG PO TABS
70.0000 mg | ORAL_TABLET | ORAL | 3 refills | Status: DC
Start: 2020-04-22 — End: 2021-04-24

## 2020-04-22 MED ORDER — LISINOPRIL-HYDROCHLOROTHIAZIDE 20-25 MG PO TABS
1.0000 | ORAL_TABLET | Freq: Every day | ORAL | 3 refills | Status: DC
Start: 2020-04-22 — End: 2021-04-24

## 2020-04-22 NOTE — Assessment & Plan Note (Signed)
Chronic Check lipid panel  Lifestyle controlled Regular exercise and healthy diet encouraged  

## 2020-04-22 NOTE — Assessment & Plan Note (Addendum)
Chronic Started on Fosamax 03/2019 and tolerating it well-plan to continue for 5 years DEXA due now-but since she is only been on Fosamax for 1 year we will hold off another year and get it next year Continue regular walking Continue daily vitamin D-we will check level Can not take calcium - constipation

## 2020-04-22 NOTE — Assessment & Plan Note (Signed)
Chronic Check a1c Low sugar / carb diet Stressed regular exercise  

## 2020-04-22 NOTE — Assessment & Plan Note (Addendum)
Chronic BP well controlled Continue linisopril - hctz  20-25 mg daily and diltiazem 240 mg daily cmp

## 2020-04-29 ENCOUNTER — Encounter: Payer: Self-pay | Admitting: Gastroenterology

## 2020-06-06 DIAGNOSIS — Z01411 Encounter for gynecological examination (general) (routine) with abnormal findings: Secondary | ICD-10-CM | POA: Diagnosis not present

## 2020-06-06 DIAGNOSIS — Z124 Encounter for screening for malignant neoplasm of cervix: Secondary | ICD-10-CM | POA: Diagnosis not present

## 2020-06-06 DIAGNOSIS — N816 Rectocele: Secondary | ICD-10-CM | POA: Diagnosis not present

## 2020-06-06 DIAGNOSIS — Z1231 Encounter for screening mammogram for malignant neoplasm of breast: Secondary | ICD-10-CM | POA: Diagnosis not present

## 2020-06-06 DIAGNOSIS — Z01419 Encounter for gynecological examination (general) (routine) without abnormal findings: Secondary | ICD-10-CM | POA: Diagnosis not present

## 2020-06-06 DIAGNOSIS — Z6821 Body mass index (BMI) 21.0-21.9, adult: Secondary | ICD-10-CM | POA: Diagnosis not present

## 2020-06-06 LAB — HM MAMMOGRAPHY

## 2020-06-19 ENCOUNTER — Encounter: Payer: Self-pay | Admitting: Internal Medicine

## 2020-06-19 NOTE — Progress Notes (Signed)
Outside notes received. Information abstracted. Notes sent to scan.  

## 2020-07-21 DIAGNOSIS — Z85828 Personal history of other malignant neoplasm of skin: Secondary | ICD-10-CM | POA: Diagnosis not present

## 2020-07-21 DIAGNOSIS — D225 Melanocytic nevi of trunk: Secondary | ICD-10-CM | POA: Diagnosis not present

## 2020-07-21 DIAGNOSIS — L820 Inflamed seborrheic keratosis: Secondary | ICD-10-CM | POA: Diagnosis not present

## 2020-07-21 DIAGNOSIS — L814 Other melanin hyperpigmentation: Secondary | ICD-10-CM | POA: Diagnosis not present

## 2020-07-21 DIAGNOSIS — D2271 Melanocytic nevi of right lower limb, including hip: Secondary | ICD-10-CM | POA: Diagnosis not present

## 2020-07-21 DIAGNOSIS — D1801 Hemangioma of skin and subcutaneous tissue: Secondary | ICD-10-CM | POA: Diagnosis not present

## 2020-07-21 DIAGNOSIS — L821 Other seborrheic keratosis: Secondary | ICD-10-CM | POA: Diagnosis not present

## 2020-08-25 DIAGNOSIS — L820 Inflamed seborrheic keratosis: Secondary | ICD-10-CM | POA: Diagnosis not present

## 2020-09-08 DIAGNOSIS — Z03818 Encounter for observation for suspected exposure to other biological agents ruled out: Secondary | ICD-10-CM | POA: Diagnosis not present

## 2020-09-08 DIAGNOSIS — Z20822 Contact with and (suspected) exposure to covid-19: Secondary | ICD-10-CM | POA: Diagnosis not present

## 2020-10-07 DIAGNOSIS — Z809 Family history of malignant neoplasm, unspecified: Secondary | ICD-10-CM | POA: Diagnosis not present

## 2020-10-07 DIAGNOSIS — Z8249 Family history of ischemic heart disease and other diseases of the circulatory system: Secondary | ICD-10-CM | POA: Diagnosis not present

## 2020-10-07 DIAGNOSIS — M810 Age-related osteoporosis without current pathological fracture: Secondary | ICD-10-CM | POA: Diagnosis not present

## 2020-10-07 DIAGNOSIS — Z7983 Long term (current) use of bisphosphonates: Secondary | ICD-10-CM | POA: Diagnosis not present

## 2020-10-07 DIAGNOSIS — R69 Illness, unspecified: Secondary | ICD-10-CM | POA: Diagnosis not present

## 2020-10-07 DIAGNOSIS — Z85828 Personal history of other malignant neoplasm of skin: Secondary | ICD-10-CM | POA: Diagnosis not present

## 2020-10-07 DIAGNOSIS — I10 Essential (primary) hypertension: Secondary | ICD-10-CM | POA: Diagnosis not present

## 2020-10-07 DIAGNOSIS — Z820 Family history of epilepsy and other diseases of the nervous system: Secondary | ICD-10-CM | POA: Diagnosis not present

## 2020-10-07 DIAGNOSIS — Z008 Encounter for other general examination: Secondary | ICD-10-CM | POA: Diagnosis not present

## 2021-01-29 ENCOUNTER — Telehealth: Payer: Self-pay | Admitting: Internal Medicine

## 2021-01-29 NOTE — Telephone Encounter (Signed)
LMV to schedule AWV with [facility]. Please schedule this appt if pt calls the office.

## 2021-04-17 ENCOUNTER — Telehealth: Payer: Self-pay | Admitting: Internal Medicine

## 2021-04-17 DIAGNOSIS — M810 Age-related osteoporosis without current pathological fracture: Secondary | ICD-10-CM

## 2021-04-17 DIAGNOSIS — I10 Essential (primary) hypertension: Secondary | ICD-10-CM

## 2021-04-17 DIAGNOSIS — R7303 Prediabetes: Secondary | ICD-10-CM

## 2021-04-17 DIAGNOSIS — E7849 Other hyperlipidemia: Secondary | ICD-10-CM

## 2021-04-17 NOTE — Telephone Encounter (Signed)
Pt has called in and is requesting to have lab orders put in, before her physical on next week.   When orders are sent, please let front office know, so we can call to schedule.

## 2021-04-20 NOTE — Telephone Encounter (Signed)
Pt CB is [phone]

## 2021-04-21 NOTE — Telephone Encounter (Signed)
Spoke with patient and lab appointment made. 

## 2021-04-21 NOTE — Telephone Encounter (Signed)
ordered

## 2021-04-22 ENCOUNTER — Other Ambulatory Visit (INDEPENDENT_AMBULATORY_CARE_PROVIDER_SITE_OTHER): Payer: PPO

## 2021-04-22 ENCOUNTER — Other Ambulatory Visit: Payer: Self-pay

## 2021-04-22 DIAGNOSIS — R7303 Prediabetes: Secondary | ICD-10-CM | POA: Diagnosis not present

## 2021-04-22 DIAGNOSIS — M810 Age-related osteoporosis without current pathological fracture: Secondary | ICD-10-CM

## 2021-04-22 DIAGNOSIS — I10 Essential (primary) hypertension: Secondary | ICD-10-CM | POA: Diagnosis not present

## 2021-04-22 DIAGNOSIS — E7849 Other hyperlipidemia: Secondary | ICD-10-CM | POA: Diagnosis not present

## 2021-04-22 LAB — CBC WITH DIFFERENTIAL/PLATELET
Basophils Absolute: 0 10*3/uL (ref 0.0–0.1)
Basophils Relative: 0.9 % (ref 0.0–3.0)
Eosinophils Absolute: 0.1 10*3/uL (ref 0.0–0.7)
Eosinophils Relative: 2.9 % (ref 0.0–5.0)
HCT: 43 % (ref 36.0–46.0)
Hemoglobin: 14.1 g/dL (ref 12.0–15.0)
Lymphocytes Relative: 47.3 % — ABNORMAL HIGH (ref 12.0–46.0)
Lymphs Abs: 2.5 10*3/uL (ref 0.7–4.0)
MCHC: 32.9 g/dL (ref 30.0–36.0)
MCV: 91.5 fl (ref 78.0–100.0)
Monocytes Absolute: 0.7 10*3/uL (ref 0.1–1.0)
Monocytes Relative: 13.9 % — ABNORMAL HIGH (ref 3.0–12.0)
Neutro Abs: 1.8 10*3/uL (ref 1.4–7.7)
Neutrophils Relative %: 35 % — ABNORMAL LOW (ref 43.0–77.0)
Platelets: 243 10*3/uL (ref 150.0–400.0)
RBC: 4.7 Mil/uL (ref 3.87–5.11)
RDW: 13.3 % (ref 11.5–15.5)
WBC: 5.2 10*3/uL (ref 4.0–10.5)

## 2021-04-22 LAB — VITAMIN D 25 HYDROXY (VIT D DEFICIENCY, FRACTURES): VITD: 42.18 ng/mL (ref 30.00–100.00)

## 2021-04-22 LAB — COMPREHENSIVE METABOLIC PANEL
ALT: 13 U/L (ref 0–35)
AST: 16 U/L (ref 0–37)
Albumin: 4.2 g/dL (ref 3.5–5.2)
Alkaline Phosphatase: 50 U/L (ref 39–117)
BUN: 15 mg/dL (ref 6–23)
CO2: 31 mEq/L (ref 19–32)
Calcium: 9.6 mg/dL (ref 8.4–10.5)
Chloride: 102 mEq/L (ref 96–112)
Creatinine, Ser: 0.66 mg/dL (ref 0.40–1.20)
GFR: 85.63 mL/min (ref >60.00)
Glucose, Bld: 95 mg/dL (ref 70–99)
Potassium: 4 mEq/L (ref 3.5–5.1)
Sodium: 138 mEq/L (ref 135–145)
Total Bilirubin: 0.8 mg/dL (ref 0.2–1.2)
Total Protein: 6.9 g/dL (ref 6.0–8.3)

## 2021-04-22 LAB — LIPID PANEL
Cholesterol: 194 mg/dL (ref 0–200)
HDL: 72.1 mg/dL (ref >39.00)
LDL Cholesterol: 106 mg/dL — ABNORMAL HIGH (ref 0–99)
NonHDL: 122.36
Total CHOL/HDL Ratio: 3
Triglycerides: 81 mg/dL (ref 0.0–149.0)
VLDL: 16.2 mg/dL (ref 0.0–40.0)

## 2021-04-22 LAB — HEMOGLOBIN A1C: Hgb A1c MFr Bld: 6.1 % (ref 4.6–6.5)

## 2021-04-23 ENCOUNTER — Encounter: Payer: Self-pay | Admitting: Internal Medicine

## 2021-04-23 NOTE — Patient Instructions (Addendum)
Medications changes include :   none  Your prescription(s) have been submitted to your pharmacy. Please take as directed and contact our office if you believe you are having problem(s) with the medication(s).    Please followup in 1 year    Health Maintenance, Female Adopting a healthy lifestyle and getting preventive care are important in promoting health and wellness. Ask your health care provider about: The right schedule for you to have regular tests and exams. Things you can do on your own to prevent diseases and keep yourself healthy. What should I know about diet, weight, and exercise? Eat a healthy diet  Eat a diet that includes plenty of vegetables, fruits, low-fat dairy products, and lean protein. Do not eat a lot of foods that are high in solid fats, added sugars, or sodium. Maintain a healthy weight Body mass index (BMI) is used to identify weight problems. It estimates body fat based on height and weight. Your health care provider can help determine your BMI and help you achieve or maintain a healthy weight. Get regular exercise Get regular exercise. This is one of the most important things you can do for your health. Most adults should: Exercise for at least 150 minutes each week. The exercise should increase your heart rate and make you sweat (moderate-intensity exercise). Do strengthening exercises at least twice a week. This is in addition to the moderate-intensity exercise. Spend less time sitting. Even light physical activity can be beneficial. Watch cholesterol and blood lipids Have your blood tested for lipids and cholesterol at 76 years of age, then have this test every 5 years. Have your cholesterol levels checked more often if: Your lipid or cholesterol levels are high. You are older than 76 years of age. You are at high risk for heart disease. What should I know about cancer screening? Depending on your health history and family history, you may  need to have cancer screening at various ages. This may include screening for: Breast cancer. Cervical cancer. Colorectal cancer. Skin cancer. Lung cancer. What should I know about heart disease, diabetes, and high blood pressure? Blood pressure and heart disease High blood pressure causes heart disease and increases the risk of stroke. This is more likely to develop in people who have high blood pressure readings or are overweight. Have your blood pressure checked: Every 3-5 years if you are 29-75 years of age. Every year if you are 30 years old or older. Diabetes Have regular diabetes screenings. This checks your fasting blood sugar level. Have the screening done: Once every three years after age 28 if you are at a normal weight and have a low risk for diabetes. More often and at a younger age if you are overweight or have a high risk for diabetes. What should I know about preventing infection? Hepatitis B If you have a higher risk for hepatitis B, you should be screened for this virus. Talk with your health care provider to find out if you are at risk for hepatitis B infection. Hepatitis C Testing is recommended for: Everyone born from [birth year] through 1965. Anyone with known risk factors for hepatitis C. Sexually transmitted infections (STIs) Get screened for STIs, including gonorrhea and chlamydia, if: You are sexually active and are younger than 76 years of age. You are older than 76 years of age and your health care provider tells you that you are at risk for this type of infection. Your sexual activity has changed since you were  last screened, and you are at increased risk for chlamydia or gonorrhea. Ask your health care provider if you are at risk. Ask your health care provider about whether you are at high risk for HIV. Your health care provider may recommend a prescription medicine to help prevent HIV infection. If you choose to take medicine to prevent HIV, you should first get  tested for HIV. You should then be tested every 3 months for as long as you are taking the medicine. Pregnancy If you are about to stop having your period (premenopausal) and you may become pregnant, seek counseling before you get pregnant. Take 400 to 800 micrograms (mcg) of folic acid every day if you become pregnant. Ask for birth control (contraception) if you want to prevent pregnancy. Osteoporosis and menopause Osteoporosis is a disease in which the bones lose minerals and strength with aging. This can result in bone fractures. If you are 21 years old or older, or if you are at risk for osteoporosis and fractures, ask your health care provider if you should: Be screened for bone loss. Take a calcium or vitamin D supplement to lower your risk of fractures. Be given hormone replacement therapy (HRT) to treat symptoms of menopause. Follow these instructions at home: Alcohol use Do not drink alcohol if: Your health care provider tells you not to drink. You are pregnant, may be pregnant, or are planning to become pregnant. If you drink alcohol: Limit how much you have to: 0-1 drink a day. Know how much alcohol is in your drink. In the [place] one drink equals one 12 oz bottle of beer (355 mL), one 5 oz glass of wine (148 mL), or one 1 oz glass of hard liquor (44 mL). Lifestyle Do not use any products that contain nicotine or tobacco. These products include cigarettes, chewing tobacco, and vaping devices, such as e-cigarettes. If you need help quitting, ask your health care provider. Do not use street drugs. Do not share needles. Ask your health care provider for help if you need support or information about quitting drugs. General instructions Schedule regular health, dental, and eye exams. Stay current with your vaccines. Tell your health care provider if: You often feel depressed. You have ever been abused or do not feel safe at home. Summary Adopting a healthy lifestyle and getting  preventive care are important in promoting health and wellness. Follow your health care provider's instructions about healthy diet, exercising, and getting tested or screened for diseases. Follow your health care provider's instructions on monitoring your cholesterol and blood pressure. This information is not intended to replace advice given to you by your health care provider. Make sure you discuss any questions you have with your health care provider. Document Revised: 07/28/2020 Document Reviewed: 07/28/2020 Elsevier Patient Education  Onset.

## 2021-04-23 NOTE — Progress Notes (Signed)
Subjective:    Patient ID: Sherry Mitchell, female    DOB: 1945/10/03, 76 y.o.   MRN: [id number]   This visit occurred during the SARS-CoV-2 public health emergency.  Safety protocols were in place, including screening questions prior to the visit, additional usage of staff PPE, and extensive cleaning of exam room while observing appropriate contact time as indicated for disinfecting solutions.    HPI She is here for a physical exam.   Dong well - no concerns.   Medications and allergies reviewed with patient and updated if appropriate.  Patient Active Problem List   Diagnosis Date Noted   Chest tightness 04/16/2019   Hyperlipidemia 04/12/2018   Trigger finger 04/11/2018   Prediabetes 04/09/2018   Neck pain on right side 05/25/2017   Osteoporosis 04/24/2016   Osteoarthritis    Hypertension    Colon polyps     Current Outpatient Medications on File Prior to Visit  Medication Sig Dispense Refill   Cholecalciferol (VITAMIN D3) 50 MCG (2000 UT) TABS Take 2,000 Units by mouth daily.     No current facility-administered medications on file prior to visit.    Past Medical History:  Diagnosis Date   Colon polyps 02/2003, 02/2009   Adenomatous polyps   Diverticulitis    Diverticulosis    Heart murmur    Hypertension    Osteoarthritis    hands   Osteopenia    DEXA 02/2011, 02/2013: -1.9   Pneumonia    Prediabetes     Past Surgical History:  Procedure Laterality Date   BREAST ENHANCEMENT SURGERY  1987   COLONOSCOPY      Social History   Socioeconomic History   Marital status: Married    Spouse name: Not on file   Number of children: 2   Years of education: Not on file   Highest education level: Not on file  Occupational History   Occupation: retired  Tobacco Use   Smoking status: Never   Smokeless tobacco: Never  Vaping Use   Vaping Use: Never used  Substance and Sexual Activity   Alcohol use: Not Currently    Comment: wine with dinner   Drug use:  No   Sexual activity: Not Currently  Other Topics Concern   Not on file  Social History Narrative   Lives with her husband   Exercises regularly   Drinks 1 glass of wine with dinner   Social Determinants of Health   Financial Resource Strain: Not on file  Food Insecurity: Not on file  Transportation Needs: Not on file  Physical Activity: Not on file  Stress: Not on file  Social Connections: Not on file    Family History  Problem Relation Age of Onset   Dementia Brother    Parkinson's disease Brother    Cancer Mother        cancer of saliva glands   Heart disease Other    Heart attack Father    Rheumatic fever Father    Colon cancer Neg Hx    Esophageal cancer Neg Hx    Stomach cancer Neg Hx    Rectal cancer Neg Hx     Review of Systems  Constitutional:  Negative for chills and fever.  Eyes:  Negative for visual disturbance.  Respiratory:  Negative for cough, shortness of breath and wheezing.   Cardiovascular:  Negative for chest pain, palpitations and leg swelling.  Gastrointestinal:  Negative for abdominal pain, blood in stool, constipation, diarrhea and nausea.  No gerd  Genitourinary:  Negative for dysuria and hematuria.  Musculoskeletal:  Positive for arthralgias. Negative for back pain.  Skin:  Negative for rash.  Neurological:  Positive for headaches (rare). Negative for dizziness and light-headedness.  Psychiatric/Behavioral:  Negative for dysphoric mood. The patient is not nervous/anxious.       Objective:   Vitals:   04/24/21 1338  BP: 110/72  Pulse: 68  Temp: 98 F (36.7 C)  SpO2: 99%   Filed Weights   04/24/21 1338  Weight: 137 lb 6.4 oz (62.3 kg)   Body mass index is 22.18 kg/m.  BP Readings from Last 3 Encounters:  04/24/21 110/72  04/22/20 112/72  04/16/20 (!) 104/55    Wt Readings from Last 3 Encounters:  04/24/21 137 lb 6.4 oz (62.3 kg)  04/22/20 138 lb 9.6 oz (62.9 kg)  04/16/20 142 lb (64.4 kg)    Depression screen  [facility] 2/9 04/26/2021 12/05/2019 04/13/2019 11/29/2018 04/11/2018  Decreased Interest 0 0 0 0 0  Down, Depressed, Hopeless 0 0 0 0 0  PHQ - 2 Score 0 0 0 0 0  Altered sleeping 1 - - - -  Tired, decreased energy 0 - - - -  Change in appetite 0 - - - -  Feeling bad or failure about yourself  0 - - - -  Trouble concentrating 0 - - - -  Moving slowly or fidgety/restless 0 - - - -  Suicidal thoughts 0 - - - -  PHQ-9 Score 1 - - - -  Difficult doing work/chores Not difficult at all - - - -     GAD 7 : Generalized Anxiety Score 04/26/2021  Nervous, Anxious, on Edge 1  Control/stop worrying 0  Worry too much - different things 1  Trouble relaxing 0  Restless 0  Easily annoyed or irritable 0  Afraid - awful might happen 0  Total GAD 7 Score 2  Anxiety Difficulty Not difficult at all       Physical Exam Constitutional: She appears well-developed and well-nourished. No distress.  HENT:  Head: Normocephalic and atraumatic.  Right Ear: External ear normal. Normal ear canal and TM Left Ear: External ear normal.  Normal ear canal and TM Mouth/Throat: Oropharynx is clear and moist.  Eyes: Conjunctivae and EOM are normal.  Neck: Neck supple. No tracheal deviation present. No thyromegaly present.  No carotid bruit  Cardiovascular: Normal rate, regular rhythm and normal heart sounds.   No murmur heard.  No edema. Pulmonary/Chest: Effort normal and breath sounds normal. No respiratory distress. She has no wheezes. She has no rales.  Breast: deferred   Abdominal: Soft. She exhibits no distension. There is no tenderness.  Lymphadenopathy: She has no cervical adenopathy.  Skin: Skin is warm and dry. She is not diaphoretic.  Psychiatric: She has a normal mood and affect. Her behavior is normal.     Lab Results  Component Value Date   WBC 5.2 04/22/2021   HGB 14.1 04/22/2021   HCT 43.0 04/22/2021   PLT 243.0 04/22/2021   GLUCOSE 95 04/22/2021   CHOL 194 04/22/2021   TRIG 81.0 04/22/2021   HDL  72.10 04/22/2021   LDLDIRECT 125.1 03/01/2013   LDLCALC 106 (H) 04/22/2021   ALT 13 04/22/2021   AST 16 04/22/2021   NA 138 04/22/2021   K 4.0 04/22/2021   CL 102 04/22/2021   CREATININE 0.66 04/22/2021   BUN 15 04/22/2021   CO2 31 04/22/2021   TSH 0.93 04/22/2020  HGBA1C 6.1 04/22/2021         Assessment & Plan:   Physical exam: Screening blood work  ordered Exercise  very active Weight  normal Substance abuse  none   Reviewed recommended immunizations.   Health Maintenance  Topic Date Due   DEXA SCAN  04/14/2020   COVID-19 Vaccine (3 - Booster for Pfizer series) 05/10/2021 (Originally 07/16/2019)   TETANUS/TDAP  04/24/2022 (Originally 02/25/2021)   COLONOSCOPY (Pts 45-71yrs Insurance coverage will need to be confirmed)  04/17/2023   Pneumonia Vaccine 13+ Years old  Completed   INFLUENZA VACCINE  Completed   Hepatitis C Screening  Completed   Zoster Vaccines- Shingrix  Completed   HPV VACCINES  Aged Out          See Problem List for Assessment and Plan of chronic medical problems.

## 2021-04-24 ENCOUNTER — Ambulatory Visit (INDEPENDENT_AMBULATORY_CARE_PROVIDER_SITE_OTHER): Payer: PPO | Admitting: Internal Medicine

## 2021-04-24 ENCOUNTER — Other Ambulatory Visit: Payer: Self-pay

## 2021-04-24 VITALS — BP 110/72 | HR 68 | Temp 98.0°F | Ht 66.0 in | Wt 137.4 lb

## 2021-04-24 DIAGNOSIS — R7303 Prediabetes: Secondary | ICD-10-CM | POA: Diagnosis not present

## 2021-04-24 DIAGNOSIS — Z0000 Encounter for general adult medical examination without abnormal findings: Secondary | ICD-10-CM

## 2021-04-24 DIAGNOSIS — M810 Age-related osteoporosis without current pathological fracture: Secondary | ICD-10-CM

## 2021-04-24 DIAGNOSIS — I10 Essential (primary) hypertension: Secondary | ICD-10-CM

## 2021-04-24 DIAGNOSIS — E7849 Other hyperlipidemia: Secondary | ICD-10-CM

## 2021-04-24 MED ORDER — DILTIAZEM HCL ER 240 MG PO CP24: 240.0000 mg | ORAL_CAPSULE | Freq: Every day | ORAL | 3 refills | Status: DC

## 2021-04-24 MED ORDER — LISINOPRIL-HYDROCHLOROTHIAZIDE 20-25 MG PO TABS: 1.0000 | ORAL_TABLET | Freq: Every day | ORAL | 3 refills | Status: DC

## 2021-04-24 MED ORDER — ALENDRONATE SODIUM 70 MG PO TABS: 70.0000 mg | ORAL_TABLET | ORAL | 3 refills | Status: DC

## 2021-04-26 NOTE — Assessment & Plan Note (Signed)
Chronic Dexa due - ordered Continue regular exercise continue vitamin d daily - check level Advised calcium of about 1200 mg daily Continue fosamax 70 mg q week - continue for 5 years - it has been 2 years now

## 2021-04-26 NOTE — Assessment & Plan Note (Addendum)
Chronic BP well controlled Continue zestoretic 20-25 mg daily, diltiazem 240 mg daily cmp reviewed

## 2021-04-26 NOTE — Assessment & Plan Note (Signed)
Chronic Low sugar / carb diet Stressed regular exercise Lab Results  Component Value Date   HGBA1C 6.1 04/22/2021

## 2021-04-26 NOTE — Assessment & Plan Note (Addendum)
Chronic Lipids controlled diet controlled Regular exercise and healthy diet encouraged

## 2021-04-27 ENCOUNTER — Other Ambulatory Visit: Payer: Self-pay

## 2021-04-27 ENCOUNTER — Ambulatory Visit (INDEPENDENT_AMBULATORY_CARE_PROVIDER_SITE_OTHER)
Admission: RE | Admit: 2021-04-27 | Discharge: 2021-04-27 | Disposition: A | Discharge status: Home / Self Care | Payer: PPO | Source: Ambulatory Visit | Attending: Internal Medicine | Admitting: Internal Medicine

## 2021-04-27 DIAGNOSIS — M810 Age-related osteoporosis without current pathological fracture: Secondary | ICD-10-CM | POA: Diagnosis not present

## 2021-06-11 ENCOUNTER — Telehealth: Payer: Self-pay

## 2021-06-11 NOTE — Telephone Encounter (Signed)
It has been over a month so I do not recall all of the specifics of the pain.  It may be best if she saw sports medicine downstairs.  They would do an x-ray but also evaluate her pain.  The x-ray most likely will not give a complete picture of what is going on.  Would recommend she sees sports medicine. ? ? ?[facility] - [phone] ? ?

## 2021-06-11 NOTE — Telephone Encounter (Signed)
Pt is calling back to let Dr. Quay Burow know that her leg has started bothering her again and was seeing if an xray would be scheduled to see what's causing the pain as discussed at her last appt. ? ?Please advise [phone] ? ?

## 2021-06-11 NOTE — Telephone Encounter (Signed)
Info given today 

## 2021-06-12 ENCOUNTER — Encounter: Payer: Self-pay | Admitting: Family Medicine

## 2021-06-12 ENCOUNTER — Ambulatory Visit: Payer: PPO | Admitting: Family Medicine

## 2021-06-12 ENCOUNTER — Ambulatory Visit (INDEPENDENT_AMBULATORY_CARE_PROVIDER_SITE_OTHER): Payer: PPO

## 2021-06-12 ENCOUNTER — Other Ambulatory Visit: Payer: Self-pay

## 2021-06-12 VITALS — BP 124/70 | HR 60 | Ht 66.0 in | Wt 138.2 lb

## 2021-06-12 DIAGNOSIS — M79604 Pain in right leg: Secondary | ICD-10-CM | POA: Diagnosis not present

## 2021-06-12 DIAGNOSIS — G8929 Other chronic pain: Secondary | ICD-10-CM

## 2021-06-12 DIAGNOSIS — M545 Low back pain, unspecified: Secondary | ICD-10-CM | POA: Diagnosis not present

## 2021-06-12 DIAGNOSIS — M5441 Lumbago with sciatica, right side: Secondary | ICD-10-CM | POA: Diagnosis not present

## 2021-06-12 DIAGNOSIS — M4316 Spondylolisthesis, lumbar region: Secondary | ICD-10-CM | POA: Diagnosis not present

## 2021-06-12 DIAGNOSIS — M79661 Pain in right lower leg: Secondary | ICD-10-CM

## 2021-06-12 MED ORDER — GABAPENTIN 100 MG PO CAPS: 100.0000 mg | ORAL_CAPSULE | Freq: Every day | ORAL | 3 refills | Status: DC

## 2021-06-12 NOTE — Patient Instructions (Addendum)
Nice to meet you today. ? ?I've referred you to Physical Therapy.  Their office will call you to schedule but please let us know if you haven't heard from them in one week regarding scheduling. ? ?I've prescribed Gabapentin for you to take at bedtime as needed. ? ?Please get an Xray today before you leave. ? ?Follow-up: 8 weeks ?

## 2021-06-12 NOTE — Progress Notes (Signed)
? ?  I, Wendy Poet, LAT, ATC, am serving as scribe for Dr. Lynne Leader. ? ?Sherry Mitchell is a 76 y.o. female who presents to [place] at [facility] today for R lower leg pain since Jan 2023.  She locates her pain to .  She has hx of arthritis in her knees and her hands and has been diagnosed w/ osteoporosis.   ? ?Low back pain: intermittently yes on the R side ?Radiating pain: yes along her r lateral lower leg ?R LE paresthesias: no ?Aggravating factors: nothing in particular ?Treatments tried: Tylenol; Voltaren gel ? ?Diagnostic testing: Bone density test- 04/27/21; L-spine MRI- 11/18/08 ? ?Pertinent review of systems: No fevers or chills ? ?Relevant historical information: Hypertension and osteoporosis ? ? ?Exam:  ?BP 124/70 (BP Location: Right Arm, Patient Position: Sitting, Cuff Size: Normal)   Pulse 60   Ht '5\' 6"'$  (1.676 m)   Wt 138 lb 3.2 oz (62.7 kg)   SpO2 98%   BMI 22.31 kg/m?  ?General: Well Developed, well nourished, and in no acute distress.  ? ?MSK: L-spine: Nontender midline. ?Lumbar motion slightly limited to rotation and extension. ?Lower extremity strength is intact. ?Reflexes are intact. ?Negative slump test. ? ? ? ? ?Lab and Radiology Results ? ?X-ray images L-spine obtained today personally and independently interpreted ?Concern for bilateral pars defects at L4-L5 mild anterior listhesis L4-L5.  Mild DDD and facet DJD L4-L5 and L5-S1.  No acute fractures. ?Await formal radiology review ? ? ? ?Assessment and Plan: ?75 y.o. female with  ?Right leg pain at L5 dermatomal pattern.  Thought to be lumbar radiculopathy.  I am concerned that she may have spinal listhesis although radiology overread is still pending.  Plan for physical therapy to work on core stabilization.  Check back in about 6 weeks.  If not improved consider MRI L-spine.  Additionally will prescribe low-dose gabapentin to use at bedtime as needed. ? ? ?PDMP not reviewed this encounter. ?Orders Placed This  Encounter  ?Procedures  ? DG Lumbar Spine Complete  ?  Standing Status:   Future  ?  Number of Occurrences:   1  ?  Standing Expiration Date:   07/13/2021  ?  Order Specific Question:   Reason for Exam (SYMPTOM  OR DIAGNOSIS REQUIRED)  ?  Answer:   low back pain  ?  Order Specific Question:   Preferred imaging location?  ?  Answer:   [facility]  ? Ambulatory referral to Physical Therapy  ?  Referral Priority:   Routine  ?  Referral Type:   Physical Medicine  ?  Referral Reason:   Specialty Services Required  ?  Requested Specialty:   Physical Therapy  ?  Number of Visits Requested:   1  ? ?Meds ordered this encounter  ?Medications  ? gabapentin (NEURONTIN) 100 MG capsule  ?  Sig: Take 1 capsule (100 mg total) by mouth at bedtime.  ?  Dispense:  30 capsule  ?  Refill:  3  ? ? ? ?Discussed warning signs or symptoms. Please see discharge instructions. Patient expresses understanding. ? ? ?The above documentation has been reviewed and is accurate and complete Lynne Leader, M.D. ? ? ?

## 2021-06-15 NOTE — Progress Notes (Signed)
Lumbar spine x-ray shows some arthritis changes in the low back.

## 2021-06-17 ENCOUNTER — Ambulatory Visit: Payer: PPO | Attending: Family Medicine | Admitting: Physical Therapy

## 2021-06-17 DIAGNOSIS — M79661 Pain in right lower leg: Secondary | ICD-10-CM | POA: Diagnosis not present

## 2021-06-17 DIAGNOSIS — M5441 Lumbago with sciatica, right side: Secondary | ICD-10-CM | POA: Diagnosis not present

## 2021-06-17 DIAGNOSIS — M6281 Muscle weakness (generalized): Secondary | ICD-10-CM | POA: Insufficient documentation

## 2021-06-17 DIAGNOSIS — M79604 Pain in right leg: Secondary | ICD-10-CM | POA: Insufficient documentation

## 2021-06-17 DIAGNOSIS — G8929 Other chronic pain: Secondary | ICD-10-CM | POA: Insufficient documentation

## 2021-06-17 NOTE — Therapy (Signed)
?OUTPATIENT PHYSICAL THERAPY THORACOLUMBAR EVALUATION ? ? ?Patient Name: Sherry Mitchell ?MRN: [id number] ?DOB:[birth year], 76 y.o., female ?Today's Date: 06/17/2021 ? ? PT End of Session - 06/17/21 1434   ? ? Visit Number 1   ? Number of Visits 12   ? Date for PT Re-Evaluation 08/12/21   ? Authorization Type [employer]   ? Progress Note Due on Visit 10   ? PT Start Time 1434   ? PT Stop Time 6286   ? PT Time Calculation (min) 35 min   ? ?  ?  ? ?  ? ? ?Past Medical History:  ?Diagnosis Date  ? Colon polyps 02/2003, 02/2009  ? Adenomatous polyps  ? Diverticulitis   ? Diverticulosis   ? Heart murmur   ? Hypertension   ? Osteoarthritis   ? hands  ? Osteopenia   ? DEXA 02/2011, 02/2013: -1.9  ? Pneumonia   ? Prediabetes   ? ?Past Surgical History:  ?Procedure Laterality Date  ? BREAST ENHANCEMENT SURGERY  1987  ? COLONOSCOPY    ? ?Patient Active Problem List  ? Diagnosis Date Noted  ? Chest tightness 04/16/2019  ? Hyperlipidemia 04/12/2018  ? Trigger finger 04/11/2018  ? Prediabetes 04/09/2018  ? Neck pain on right side 05/25/2017  ? Osteoporosis 04/24/2016  ? Osteoarthritis   ? Hypertension   ? Colon polyps   ? ? ?PCP: Binnie Rail, MD ? ?REFERRING PROVIDER: Gregor Hams, MD ? ?REFERRING DIAG: R LBP and R LE pain ? ?THERAPY DIAG:  ?Pain in right leg ? ?Acute right-sided low back pain with right-sided sciatica ? ?Muscle weakness (generalized) ? ?ONSET DATE: January 2023 ? ?SUBJECTIVE:                                                                                                                                                                                          ? ?SUBJECTIVE STATEMENT: ?States that she had pain in her back in January and came and went away. States it came back so she had an xray which showed arthritis. Reports it is not really bad but it comes and goes and the only thing she knows that aggravates it is stairs. Would like to reduce risk of injury. ? ?PERTINENT HISTORY:  ?osteoporosis,  HTN, diverticulitis  ? ?PAIN:  ?Are you having pain? Yes: NPRS scale: 1/10 ?Pain location: low back and right lower leg ?Pain description: dull achy ?Aggravating factors: stairs ?Relieving factors: heat  ? ? ?PRECAUTIONS: None ? ?WEIGHT BEARING RESTRICTIONS No ? ?FALLS:  ?Has patient fallen in last 6 months? No ? ? ?PLOF: Independent ? ?PATIENT GOALS to have  less pain and to prevent further injury ? ? ?OBJECTIVE:  ? ?DIAGNOSTIC FINDINGS:  ?Xray ?Degenerative changes - No MRI performed ? ?PATIENT SURVEYS:  ?FOTO 57% function ? ?SCREENING FOR RED FLAGS: ?Bowel or bladder incontinence: No ?Spinal tumors: No ?Cauda equina syndrome: No ?Compression fracture: No ?Abdominal aneurysm: No ? ?COGNITION: ? Overall cognitive status: Within functional limits for tasks assessed   ?  ?SENSATION: ?WFL ? ?MUSCLE LENGTH: ?Hamstrings: WNL B ? ? ? ?PALPATION: ?No pan with palpation in lumbar or hip musculature. ? ?LUMBAR ROM:  ? ?Active  A/PROM  ?06/17/2021  ?Flexion 25% limited  ?Extension 50% limited  ? 25% 25265limited  ?  ?Left lateral flexion 25% limited  ?Right lateral flexion  25% limited  ?   ? (Blank rows = not tested) ? ? ? ? LE Measurements ?Lower Extremity Right ?06/17/2021 Left ?06/17/2021  ? A/PROM MMT A/PROM MMT  ?Hip Flexion 110 4 110 4  ?Hip Extension 5 4- 2 4  ?Hip Abduction  4  4+  ?Hip Adduction      ?Hip Internal rotation 45*  30   ?Hip External rotation 30*  45   ?Knee Flexion  4  4  ?Knee Extension  5  5  ?Ankle Dorsiflexion      ?Ankle Plantarflexion      ?Ankle Inversion      ?Ankle Eversion      ? (Blank rows = not tested) ? * pain along lower leg ? ? ?LUMBAR SPECIAL TESTS:  ?Slump test: Negative, Single leg stance test: Negative, and FABER test: positive R  ? ? ? ? ?TODAY'S TREATMENT  ?06/17/2021 ?Therapeutic Exercise: ? Aerobic: ?Supine:or ?Prone: hamstring curls x20 bilaterally, hip internal rotation alternating x15 Bilaterally ? Seated: ? Standing: ?Neuromuscular Re-education: ?Manual Therapy: ?Therapeutic  Activity: ?Self Care: ?Trigger Point Dry Needling:  ?Modalities:  ? ? ? ?PATIENT EDUCATION:  ?Education details: on current presentation, on HEP, on clinical outcomes score and POC ?Person educated: Patient ?Education method: Explanation, Demonstration, and Handouts ?Education comprehension: verbalized understanding ? ? ?HOME EXERCISE PROGRAM: ?[place] ? ?ASSESSMENT: ? ?CLINICAL IMPRESSION: ?Patient is a 76 y.o. female who was seen today for physical therapy evaluation and treatment for low back pain radiating into right LE. Patient presents with right LE pain with hip ROM, weakness throughout hips and difficulty with functional activities. Patient would greatly benefit from skilled PT to improve overall function and quality of life to reduce risk of further injury. ? ? ?OBJECTIVE IMPAIRMENTS decreased activity tolerance, decreased balance, decreased ROM, decreased strength, and pain.  ? ?ACTIVITY LIMITATIONS community activity and stairs .  ? ?PERSONAL FACTORS Age, Osteoarthritis, activity level are also affecting patient's functional outcome.  ? ? ?REHAB POTENTIAL: Good ? ?CLINICAL DECISION MAKING: Stable/uncomplicated ? ?EVALUATION COMPLEXITY: Low ? ?GOALS: ?Goals reviewed with patient?  yes ? ?SHORT TERM GOALS: ? ?Patient will be independent in self management strategies to improve quality of life and functional outcomes. ?Baseline: new program ?Target date: 07/15/2021 ?Goal status: INITIAL ? ?2.  Patient will report at least 50% improvement in overall symptoms and/or function to demonstrate improved functional mobility ?Baseline: 0% ?Target date: 07/15/2021 ?Goal status: INITIAL ? ? ? ? ? ? ?LONG TERM GOALS: ? ?Patient will report at least 75% improvement in overall symptoms and/or function to demonstrate improved functional mobility ?Baseline: 0% ?Target date: 07/29/2021 ?Goal status: INITIAL ? ?2.   Patient will meet predicted FOTO score to demonstrate improved overall function. ?Baseline: not met ?Target date:  08/12/2021 ?Goal status:  INITIAL ? ?3.  Patient will be able to demonstrate painfree hip improve functional hip mobility ?Baseline: painful ?Target date: 08/12/2021 ?Goal status: INITIAL ? ?4.  Patient will be able to ascend and descend stairs without pain to improve overall function  ?Baseline: unable ?Target date: 08/12/2021 ?Goal status: INITIAL ? ? ? ? ?PLAN: ?PT FREQUENCY: 1-2x/week for a total fo 12 visits over 8 week certification ? ?PT DURATION: 8 weeks ? ?PLANNED INTERVENTIONS: Therapeutic exercises, Therapeutic activity, Neuromuscular re-education, Balance training, Gait training, Patient/Family education, Joint manipulation, Joint mobilization, Stair training, Dry Needling, Electrical stimulation, Spinal manipulation, Spinal mobilization, Cryotherapy, Moist heat, Ultrasound, Ionotophoresis 10m/ml Dexamethasone, and Manual therapy. ? ?PLAN FOR NEXT SESSION: hip mobility and strengthening, core strengthening ? ?3:15 PM, 06/17/21 ?MJerene Pitch DPT ?Physical Therapy with [place] ?[facility] ?[phone] ? ?

## 2021-07-03 ENCOUNTER — Ambulatory Visit: Payer: PPO | Admitting: Physical Therapy

## 2021-07-06 ENCOUNTER — Ambulatory Visit: Payer: PPO | Admitting: Physical Therapy

## 2021-07-08 ENCOUNTER — Ambulatory Visit: Payer: PPO | Admitting: Physical Therapy

## 2021-07-10 ENCOUNTER — Ambulatory Visit: Payer: PPO | Admitting: Physical Therapy

## 2021-07-13 ENCOUNTER — Ambulatory Visit: Payer: PPO | Admitting: Physical Therapy

## 2021-07-15 ENCOUNTER — Ambulatory Visit: Payer: PPO | Admitting: Physical Therapy

## 2021-07-17 ENCOUNTER — Ambulatory Visit: Payer: PPO | Admitting: Physical Therapy

## 2021-07-20 ENCOUNTER — Ambulatory Visit: Payer: PPO | Admitting: Physical Therapy

## 2021-07-22 ENCOUNTER — Ambulatory Visit: Payer: PPO | Admitting: Physical Therapy

## 2021-07-22 DIAGNOSIS — Z85828 Personal history of other malignant neoplasm of skin: Secondary | ICD-10-CM | POA: Diagnosis not present

## 2021-07-22 DIAGNOSIS — L814 Other melanin hyperpigmentation: Secondary | ICD-10-CM | POA: Diagnosis not present

## 2021-07-22 DIAGNOSIS — L821 Other seborrheic keratosis: Secondary | ICD-10-CM | POA: Diagnosis not present

## 2021-07-22 DIAGNOSIS — D2372 Other benign neoplasm of skin of left lower limb, including hip: Secondary | ICD-10-CM | POA: Diagnosis not present

## 2021-07-22 DIAGNOSIS — L820 Inflamed seborrheic keratosis: Secondary | ICD-10-CM | POA: Diagnosis not present

## 2021-07-22 DIAGNOSIS — L918 Other hypertrophic disorders of the skin: Secondary | ICD-10-CM | POA: Diagnosis not present

## 2021-07-24 ENCOUNTER — Ambulatory Visit: Payer: PPO | Admitting: Physical Therapy

## 2021-08-06 ENCOUNTER — Encounter: Payer: Self-pay | Admitting: Family Medicine

## 2021-08-06 ENCOUNTER — Ambulatory Visit: Payer: PPO | Admitting: Family Medicine

## 2021-08-06 VITALS — BP 112/64 | HR 69 | Ht 66.0 in | Wt 135.6 lb

## 2021-08-06 DIAGNOSIS — M5441 Lumbago with sciatica, right side: Secondary | ICD-10-CM | POA: Diagnosis not present

## 2021-08-06 DIAGNOSIS — G8929 Other chronic pain: Secondary | ICD-10-CM

## 2021-08-06 MED ORDER — GABAPENTIN 100 MG PO CAPS
100.0000 mg | ORAL_CAPSULE | Freq: Every day | ORAL | 3 refills | Status: DC
Start: 2021-08-06 — End: 2022-04-26

## 2021-08-06 NOTE — Progress Notes (Signed)
I, Wendy Poet, LAT, ATC, am serving as scribe for Dr. Lynne Leader.  Sherry Mitchell is a 76 y.o. female who presents to [place] [facility] today for f/u R-sided LBP w/ radiating R leg pain at L5 dermatomal pattern. Pt was last seen by Dr. Georgina Snell on 06/12/21 and was prescribed gabapentin and referred to PT, completing 1 visit (and canceling all remaining visits). Today, pt reports that she feels about the same but notes slightly worsening R lateral lower leg pain, especially into the afternoon and evening.  She describes her pain as a burning sensation.   She had a death in the family and had been out of town for approximately one month so hasn't done PT.  She has reached back out to PT and will resume on 08/19/21.  She tried the gabapentin but reports no change in her symptoms w/ the gabapentin.  She has been doing home exercises however on her own as taught previously in clinic and by physical therapy with little benefit so far.  Dx testing: 06/12/21 L-spine XR 04/27/21 Bone density test  11/18/08 L-spine MRI  Pertinent review of systems: No fevers or chills  Relevant historical information: Attention.  Osteoporosis.   Exam:  BP 112/64 (BP Location: Right Arm, Patient Position: Sitting, Cuff Size: Normal)   Pulse 69   Ht '5\' 6"'$  (1.676 m)   Wt 135 lb 9.6 oz (61.5 kg)   SpO2 97%   BMI 21.89 kg/m  General: Well Developed, well nourished, and in no acute distress.   MSK: L-spine: Nontender midline.  Normal lumbar motion.  Positive slump test.  Lower extremity strength is intact.    Lab and Radiology Results EXAM: LUMBAR SPINE - COMPLETE 4+ VIEW   COMPARISON:  CT from 01/03/2020   FINDINGS: Five lumbar type vertebral bodies are well visualized. Vertebral body height is well maintained. Osteophytic changes and facet hypertrophic changes are noted. Mild anterolisthesis of L4 on L5 is noted of a degenerative nature. Retrolisthesis of L2 on L3 is noted of a  degenerative nature as well. These changes are stable from prior CT examination. Calcified uterine fibroid is again noted.   IMPRESSION: Degenerative change stable from the previous CT.     Electronically Signed   By: Inez Catalina M.D.   On: 06/14/2021 23:33   I, Lynne Leader, personally (independently) visualized and performed the interpretation of the images attached in this note.     Assessment and Plan: 76 y.o. female with right lumbar radiculopathy thought to be L5 related.  Patient is failing conservative management.  She is at least tried physician directed home exercise program for 6 weeks.  She also had a trial of prednisone and low-dose gabapentin.  We will increase gabapentin and continue physical therapy for now.  We will proceed to MRI to further clarify source of pain and determine future treatment options including epidural steroid injection.  Recheck after MRI.   PDMP not reviewed this encounter. Orders Placed This Encounter  Procedures   MR Lumbar Spine Wo Contrast    Standing Status:   Future    Standing Expiration Date:   08/07/2022    Order Specific Question:   What is the patient's sedation requirement?    Answer:   No Sedation    Order Specific Question:   Does the patient have a pacemaker or implanted devices?    Answer:   No    Order Specific Question:   Preferred imaging location?  Answer:   Product/process development scientist (table limit-350lbs)   Meds ordered this encounter  Medications   gabapentin (NEURONTIN) 100 MG capsule    Sig: Take 1-3 capsules (100-300 mg total) by mouth at bedtime.    Dispense:  90 capsule    Refill:  3     Discussed warning signs or symptoms. Please see discharge instructions. Patient expresses understanding.  The above documentation has been reviewed and is accurate and complete Lynne Leader, M.D.

## 2021-08-06 NOTE — Patient Instructions (Addendum)
Good to see you today.  Resume PT as you already have scheduled.  You should hear from MRI scheduling within 1 week. If you do not hear please let me know.    Follow-up: after the MRI.

## 2021-08-10 ENCOUNTER — Ambulatory Visit (INDEPENDENT_AMBULATORY_CARE_PROVIDER_SITE_OTHER): Payer: PPO

## 2021-08-10 DIAGNOSIS — M4727 Other spondylosis with radiculopathy, lumbosacral region: Secondary | ICD-10-CM | POA: Diagnosis not present

## 2021-08-10 DIAGNOSIS — M48061 Spinal stenosis, lumbar region without neurogenic claudication: Secondary | ICD-10-CM | POA: Diagnosis not present

## 2021-08-10 DIAGNOSIS — M5416 Radiculopathy, lumbar region: Secondary | ICD-10-CM

## 2021-08-10 DIAGNOSIS — G8929 Other chronic pain: Secondary | ICD-10-CM

## 2021-08-10 DIAGNOSIS — M5117 Intervertebral disc disorders with radiculopathy, lumbosacral region: Secondary | ICD-10-CM | POA: Diagnosis not present

## 2021-08-12 ENCOUNTER — Telehealth: Payer: Self-pay | Admitting: Internal Medicine

## 2021-08-12 NOTE — Progress Notes (Signed)
MRI lumbar spine shows potential for nerve to get pinched at L4-L5 on the right side which certainly could cause your symptoms.  Recommend return to clinic to discuss results in full details.  Alternatives are continue physical therapy for now or we could proceed to epidural steroid injection if he would like that.  This is a bit of a discussion so a face-to-face visit or we can look at the pictures and talk about treatment options is probably best.

## 2021-08-12 NOTE — Telephone Encounter (Signed)
Attempted to reach patient but number not valid.  I haven't not called her for anything if she calls back.

## 2021-08-12 NOTE — Telephone Encounter (Signed)
Pt states she missed a call from the office.   Unaware of who called. Please call back ASAP.

## 2021-08-19 ENCOUNTER — Encounter: Payer: Self-pay | Admitting: Physical Therapy

## 2021-08-19 ENCOUNTER — Ambulatory Visit: Payer: PPO | Attending: Family Medicine | Admitting: Physical Therapy

## 2021-08-19 DIAGNOSIS — M6281 Muscle weakness (generalized): Secondary | ICD-10-CM | POA: Insufficient documentation

## 2021-08-19 DIAGNOSIS — M5441 Lumbago with sciatica, right side: Secondary | ICD-10-CM | POA: Insufficient documentation

## 2021-08-19 DIAGNOSIS — M79604 Pain in right leg: Secondary | ICD-10-CM | POA: Insufficient documentation

## 2021-08-19 NOTE — Therapy (Addendum)
OUTPATIENT PHYSICAL THERAPY Treatment   Patient Name: Sherry Mitchell MRN: [id number] DOB:[birth year], 76 y.o., female Today's Date: 08/25/2021      Past Medical History:  Diagnosis Date   Colon polyps 02/2003, 02/2009   Adenomatous polyps   Diverticulitis    Diverticulosis    Heart murmur    Hypertension    Osteoarthritis    hands   Osteopenia    DEXA 02/2011, 02/2013: -1.9   Pneumonia    Prediabetes    Past Surgical History:  Procedure Laterality Date   BREAST ENHANCEMENT SURGERY  1987   COLONOSCOPY     Patient Active Problem List   Diagnosis Date Noted   Chest tightness 04/16/2019   Hyperlipidemia 04/12/2018   Trigger finger 04/11/2018   Prediabetes 04/09/2018   Neck pain on right side 05/25/2017   Osteoporosis 04/24/2016   Osteoarthritis    Hypertension    Colon polyps     PCP: Binnie Rail, MD  REFERRING PROVIDER: Binnie Rail, MD  REFERRING DIAG: R LBP and R LE pain  THERAPY DIAG:  Pain in right leg - Plan: PT plan of care cert/re-cert  Muscle weakness (generalized) - Plan: PT plan of care cert/re-cert  Acute right-sided low back pain with right-sided sciatica - Plan: PT plan of care cert/re-cert  ONSET DATE: January 2023  SUBJECTIVE:                                                                                                                                                                                           SUBJECTIVE STATEMENT: Patient reports long absence due to a death in her family and she was out of town for over a month. She reports improved pain, still has some tightness and mild pain in RLE. PERTINENT HISTORY:  osteoporosis, HTN, diverticulitis   PAIN:  Are you having pain? Yes: NPRS scale: 1/10 Pain location: low back and right lower leg Pain description: dull achy Aggravating factors: stairs Relieving factors: heat    PRECAUTIONS: None  WEIGHT BEARING RESTRICTIONS No  FALLS:  Has patient fallen in last 6  months? No   PLOF: Independent  PATIENT GOALS to have less pain and to prevent further injury   OBJECTIVE:   Today's Treatment: Nu Step-L5 x 5 minutes, LE only. Updated HEP with new stretch and strengthening-Active HS, figure 4, ITB stretches x 2. Bridge with Tband resistance for abd, standing 3 way kicks with red Tband resistance. 10 reps each    PATIENT EDUCATION: Updated HEP  HOME EXERCISE PROGRAM: [id number]  ASSESSMENT:  CLINICAL IMPRESSION:Patient presents after extended absence due to a death in her family.  Luckily her symptoms have improved. She will still benefit from stretching and strengthening of lower body to improve postural stability and decrease risk of re-occurrence of pain. Updated HEP today for stretch and strength. .   OBJECTIVE IMPAIRMENTS decreased activity tolerance, decreased balance, decreased ROM, decreased strength, and pain.   ACTIVITY LIMITATIONS community activity and stairs .   PERSONAL FACTORS Age, Osteoarthritis, activity level are also affecting patient's functional outcome.    REHAB POTENTIAL: Good  CLINICAL DECISION MAKING: Stable/uncomplicated  EVALUATION COMPLEXITY: Low  GOALS: Goals reviewed with patient?  yes  SHORT TERM GOALS:  Patient will be independent in self management strategies to improve quality of life and functional outcomes. Baseline: new program Target date: 09/16/2021 Goal status: in progress  2.  Patient will report at least 50% improvement in overall symptoms and/or function to demonstrate improved functional mobility Baseline: 0% Target date: 09/16/2021 Goal status: met      LONG TERM GOALS:  Patient will report at least 75% improvement in overall symptoms and/or function to demonstrate improved functional mobility Baseline: 0% Target date: 09/30/2021 Goal status: INITIAL  2.   Patient will meet predicted FOTO score to demonstrate improved overall function. Baseline: not met Target date:  10/14/2021 Goal status: INITIAL  3.  Patient will be able to demonstrate painfree hip improve functional hip mobility Baseline: painful Target date: 10/14/2021 Goal status: INITIAL  4.  Patient will be able to ascend and descend stairs without pain to improve overall function  Baseline: unable Target date: 10/14/2021 Goal status: INITIAL     PLAN: PT FREQUENCY: 1-2x/week for a total fo 12 visits over 8 week certification  PT DURATION: 8 weeks  PLANNED INTERVENTIONS: Therapeutic exercises, Therapeutic activity, Neuromuscular re-education, Balance training, Gait training, Patient/Family education, Joint manipulation, Joint mobilization, Stair training, Dry Needling, Electrical stimulation, Spinal manipulation, Spinal mobilization, Cryotherapy, Moist heat, Ultrasound, Ionotophoresis 86m/ml Dexamethasone, and Manual therapy.  PLAN FOR NEXT SESSION: Update HEP to include rows, sh ext, sh ER (already given written instructions, told to hold off on these exercises.)  7:51 AM, 08/25/21 SEthel Rana DPT [facility]

## 2021-08-25 ENCOUNTER — Ambulatory Visit: Payer: PPO | Attending: Family Medicine | Admitting: Physical Therapy

## 2021-08-25 DIAGNOSIS — M79604 Pain in right leg: Secondary | ICD-10-CM | POA: Insufficient documentation

## 2021-08-25 DIAGNOSIS — M5441 Lumbago with sciatica, right side: Secondary | ICD-10-CM | POA: Insufficient documentation

## 2021-08-25 DIAGNOSIS — M6281 Muscle weakness (generalized): Secondary | ICD-10-CM | POA: Diagnosis not present

## 2021-08-25 NOTE — Therapy (Signed)
OUTPATIENT PHYSICAL THERAPY Treatment   Patient Name: COLLYNS MCQUIGG MRN: [id number] DOB:[birth year], 76 y.o., female Today's Date: 08/25/2021   PT End of Session - 08/25/21 1311     Visit Number 3    Number of Visits 12    Date for PT Re-Evaluation 09/16/21    PT Start Time 1310    PT Stop Time 4193    PT Time Calculation (min) 45 min               Past Medical History:  Diagnosis Date   Colon polyps 02/2003, 02/2009   Adenomatous polyps   Diverticulitis    Diverticulosis    Heart murmur    Hypertension    Osteoarthritis    hands   Osteopenia    DEXA 02/2011, 02/2013: -1.9   Pneumonia    Prediabetes    Past Surgical History:  Procedure Laterality Date   BREAST ENHANCEMENT SURGERY  1987   COLONOSCOPY     Patient Active Problem List   Diagnosis Date Noted   Chest tightness 04/16/2019   Hyperlipidemia 04/12/2018   Trigger finger 04/11/2018   Prediabetes 04/09/2018   Neck pain on right side 05/25/2017   Osteoporosis 04/24/2016   Osteoarthritis    Hypertension    Colon polyps     PCP: Binnie Rail, MD  REFERRING PROVIDER: Binnie Rail, MD  REFERRING DIAG: R LBP and R LE pain  THERAPY DIAG:  Pain in right leg  Muscle weakness (generalized)  Acute right-sided low back pain with right-sided sciatica  ONSET DATE: January 2023  SUBJECTIVE:                                                                                                                                                                                           SUBJECTIVE STATEMENT: overall 60% better. Everything is helping new HEP is good  PERTINENT HISTORY:  osteoporosis, HTN, diverticulitis   PAIN:  Are you having pain? Yes: NPRS scale: 2/10 Pain location: low back and right lower leg Pain description: dull achy Aggravating factors: stairs Relieving factors: heat    PRECAUTIONS: None  WEIGHT BEARING RESTRICTIONS No  FALLS:  Has patient fallen in last 6 months?  No   PLOF: Independent  PATIENT GOALS to have less pain and to prevent further injury   OBJECTIVE:   08/25/21 Nustep L 5 5 min Red tband shld ext,row and ER ( too hard so instructed not to do at home) - issued as HEP Step up 10 x each leg 6 inch with BIL UE opp leg ext- postural cuing needed Step up laterally 10 x each  with opp leg abd STS with wt ball press 10 x Trunk rotation 10 x each way wt ball Supine feet on ball bridge, KTC and obl 10 x each Supine isometric abs 15 x PROM/stretch BIL LE and trunk    Today's Treatment: Nu Step-L5 x 5 minutes, LE only. Updated HEP with new stretch and strengthening-Active HS, figure 4, ITB stretches x 2. Bridge with Tband resistance for abd, standing 3 way kicks with red Tband resistance. 10 reps each    PATIENT EDUCATION: Updated HEP  HOME EXERCISE PROGRAM: [id number]  ASSESSMENT:  CLINICAL IMPRESSION:  Pt arrived back in today and stated new HEP is going well and pain is getting better. Progressed strengthening with cuing.   OBJECTIVE IMPAIRMENTS decreased activity tolerance, decreased balance, decreased ROM, decreased strength, and pain.   ACTIVITY LIMITATIONS community activity and stairs .   PERSONAL FACTORS Age, Osteoarthritis, activity level are also affecting patient's functional outcome.    REHAB POTENTIAL: Good  CLINICAL DECISION MAKING: Stable/uncomplicated  EVALUATION COMPLEXITY: Low  GOALS: Goals reviewed with patient?  yes  SHORT TERM GOALS:  Patient will be independent in self management strategies to improve quality of life and functional outcomes. Baseline: new program Target date: 09/16/2021 Goal status: in progress  2.  Patient will report at least 50% improvement in overall symptoms and/or function to demonstrate improved functional mobility Baseline: 0% Target date: 09/16/2021 Goal status: met      LONG TERM GOALS:  Patient will report at least 75% improvement in overall symptoms and/or  function to demonstrate improved functional mobility Baseline: 0% Target date: 09/30/2021 Goal status: INITIAL  2.   Patient will meet predicted FOTO score to demonstrate improved overall function. Baseline: not met Target date: 10/14/2021 Goal status: INITIAL  3.  Patient will be able to demonstrate painfree hip improve functional hip mobility Baseline: painful Target date: 10/14/2021 Goal status: INITIAL  4.  Patient will be able to ascend and descend stairs without pain to improve overall function  Baseline: unable Target date: 10/14/2021 Goal status: INITIAL     PLAN: PT FREQUENCY: 1-2x/week for a total fo 12 visits over 8 week certification  PT DURATION: 8 weeks  PLANNED INTERVENTIONS: Therapeutic exercises, Therapeutic activity, Neuromuscular re-education, Balance training, Gait training, Patient/Family education, Joint manipulation, Joint mobilization, Stair training, Dry Needling, Electrical stimulation, Spinal manipulation, Spinal mobilization, Cryotherapy, Moist heat, Ultrasound, Ionotophoresis 52m/ml Dexamethasone, and Manual therapy.  PLAN FOR NEXT SESSION: assess and progress 1:12 PM, 08/25/21  AFranki MontePTA Physical Therapy Assistant with [facility]

## 2021-08-25 NOTE — Addendum Note (Signed)
Addended by: Marcelina Morel on: 08/25/2021 07:49 AM   Modules accepted: Orders

## 2021-08-26 NOTE — Therapy (Signed)
OUTPATIENT PHYSICAL THERAPY Treatment   Patient Name: Sherry Mitchell MRN: [id number] DOB:Apr 22, 1945, 76 y.o., female Today's Date: 08/27/2021   PT End of Session - 08/27/21 1546     Visit Number 4    Number of Visits 12    PT Start Time 4158    PT Stop Time 1625    PT Time Calculation (min) 40 min    Activity Tolerance Patient tolerated treatment well    Behavior During Therapy [facility] for tasks assessed/performed                Past Medical History:  Diagnosis Date   Colon polyps 02/2003, 02/2009   Adenomatous polyps   Diverticulitis    Diverticulosis    Heart murmur    Hypertension    Osteoarthritis    hands   Osteopenia    DEXA 02/2011, 02/2013: -1.9   Pneumonia    Prediabetes    Past Surgical History:  Procedure Laterality Date   BREAST ENHANCEMENT SURGERY  1987   COLONOSCOPY     Patient Active Problem List   Diagnosis Date Noted   Chest tightness 04/16/2019   Hyperlipidemia 04/12/2018   Trigger finger 04/11/2018   Prediabetes 04/09/2018   Neck pain on right side 05/25/2017   Osteoporosis 04/24/2016   Osteoarthritis    Hypertension    Colon polyps     PCP: Binnie Rail, MD  REFERRING PROVIDER: Binnie Rail, MD  REFERRING DIAG: R LBP and R LE pain  THERAPY DIAG:  Pain in right leg  Muscle weakness (generalized)  Acute right-sided low back pain with right-sided sciatica  ONSET DATE: January 2023  SUBJECTIVE:                                                                                                                                                                                           SUBJECTIVE STATEMENT: Uncomfortable feeling in R leg but says its not pain. Did not have a good night sleep last night  PERTINENT HISTORY:  osteoporosis, HTN, diverticulitis   PAIN:  Are you having pain? Yes: NPRS scale: 0/10 Pain location: low back and right lower leg Pain description: dull achy Aggravating factors: stairs Relieving factors:  heat    PRECAUTIONS: None  WEIGHT BEARING RESTRICTIONS No  FALLS:  Has patient fallen in last 6 months? No   PLOF: Independent  PATIENT GOALS to have less pain and to prevent further injury   Treatment 08/27/21 Nustep L5, 39mns Step ups 6", CGA due to 1 LOB  Lateral step ups Trunk rotations 266ms Bridges w/band 2x10 Clamshells w/band 2x10 HS stretch 30s  Pirformis  stretch 30s STS w/ball 2x10 Anti rotation pallof press 2x10 10# Lateral banded walks   08/25/21 Nustep L 5 5 min Red tband shld ext,row and ER ( too hard so instructed not to do at home) - issued as HEP Step up 10 x each leg 6 inch with BIL UE opp leg ext- postural cuing needed Step up laterally 10 x each with opp leg abd STS with wt ball press 10 x Trunk rotation 10 x each way wt ball Supine feet on ball bridge, KTC and obl 10 x each Supine isometric abs 15 x PROM/stretch BIL LE and trunk    PATIENT EDUCATION: Updated HEP  HOME EXERCISE PROGRAM: [id number]  ASSESSMENT:  CLINICAL IMPRESSION:   Patient returns to PT with no pain and states everything is going well and she has noticed good improvement since her first visit. Patient tolerates all treatment well and can be progressed next visit to work on more LE and core strengthening to help with LBP.   OBJECTIVE IMPAIRMENTS decreased activity tolerance, decreased balance, decreased ROM, decreased strength, and pain.   ACTIVITY LIMITATIONS community activity and stairs .   PERSONAL FACTORS Age, Osteoarthritis, activity level are also affecting patient's functional outcome.    REHAB POTENTIAL: Good  CLINICAL DECISION MAKING: Stable/uncomplicated  EVALUATION COMPLEXITY: Low  GOALS: Goals reviewed with patient?  yes  SHORT TERM GOALS:  Patient will be independent in self management strategies to improve quality of life and functional outcomes. Baseline: new program Target date: 09/16/2021 Goal status: in progress  2.  Patient will report at  least 50% improvement in overall symptoms and/or function to demonstrate improved functional mobility Baseline: 0% Target date: 09/16/2021 Goal status: met      LONG TERM GOALS:  Patient will report at least 75% improvement in overall symptoms and/or function to demonstrate improved functional mobility Baseline: 0% Target date: 09/30/2021 Goal status: INITIAL  2.   Patient will meet predicted FOTO score to demonstrate improved overall function. Baseline: not met Target date: 10/14/2021 Goal status: INITIAL  3.  Patient will be able to demonstrate painfree hip improve functional hip mobility Baseline: painful Target date: 10/14/2021 Goal status: INITIAL  4.  Patient will be able to ascend and descend stairs without pain to improve overall function  Baseline: unable Target date: 10/14/2021 Goal status: INITIAL     PLAN: PT FREQUENCY: 1-2x/week for a total fo 12 visits over 8 week certification  PT DURATION: 8 weeks  PLANNED INTERVENTIONS: Therapeutic exercises, Therapeutic activity, Neuromuscular re-education, Balance training, Gait training, Patient/Family education, Joint manipulation, Joint mobilization, Stair training, Dry Needling, Electrical stimulation, Spinal manipulation, Spinal mobilization, Cryotherapy, Moist heat, Ultrasound, Ionotophoresis 35m/ml Dexamethasone, and Manual therapy.  PLAN FOR NEXT SESSION: progress, banded walking, hip and core strengthening  4:29 PM, 08/27/21  AFranki MontePTA Physical Therapy Assistant with [facility]

## 2021-08-27 ENCOUNTER — Ambulatory Visit: Payer: PPO | Admitting: Family Medicine

## 2021-08-27 ENCOUNTER — Ambulatory Visit: Payer: PPO

## 2021-08-27 DIAGNOSIS — M6281 Muscle weakness (generalized): Secondary | ICD-10-CM

## 2021-08-27 DIAGNOSIS — M79604 Pain in right leg: Secondary | ICD-10-CM | POA: Diagnosis not present

## 2021-08-27 DIAGNOSIS — M5441 Lumbago with sciatica, right side: Secondary | ICD-10-CM

## 2021-09-01 ENCOUNTER — Encounter: Payer: Self-pay | Admitting: Physical Therapy

## 2021-09-01 ENCOUNTER — Ambulatory Visit: Payer: PPO | Admitting: Physical Therapy

## 2021-09-01 DIAGNOSIS — M79604 Pain in right leg: Secondary | ICD-10-CM

## 2021-09-01 DIAGNOSIS — M5441 Lumbago with sciatica, right side: Secondary | ICD-10-CM

## 2021-09-01 DIAGNOSIS — M6281 Muscle weakness (generalized): Secondary | ICD-10-CM

## 2021-09-01 NOTE — Therapy (Signed)
OUTPATIENT PHYSICAL THERAPY Treatment   Patient Name: Sherry Mitchell MRN: [id number] DOB:11-25-1945, 76 y.o., female Today's Date: 09/01/2021   PT End of Session - 09/01/21 1328     Visit Number 5    Number of Visits 12    PT Start Time 5852    PT Stop Time 1357    PT Time Calculation (min) 39 min    Activity Tolerance Patient tolerated treatment well    Behavior During Therapy [facility] for tasks assessed/performed                 Past Medical History:  Diagnosis Date   Colon polyps 02/2003, 02/2009   Adenomatous polyps   Diverticulitis    Diverticulosis    Heart murmur    Hypertension    Osteoarthritis    hands   Osteopenia    DEXA 02/2011, 02/2013: -1.9   Pneumonia    Prediabetes    Past Surgical History:  Procedure Laterality Date   BREAST ENHANCEMENT SURGERY  1987   COLONOSCOPY     Patient Active Problem List   Diagnosis Date Noted   Chest tightness 04/16/2019   Hyperlipidemia 04/12/2018   Trigger finger 04/11/2018   Prediabetes 04/09/2018   Neck pain on right side 05/25/2017   Osteoporosis 04/24/2016   Osteoarthritis    Hypertension    Colon polyps     PCP: Binnie Rail, MD  REFERRING PROVIDER: Binnie Rail, MD  REFERRING DIAG: R LBP and R LE pain  THERAPY DIAG:  Pain in right leg  Muscle weakness (generalized)  Acute right-sided low back pain with right-sided sciatica  ONSET DATE: January 2023  SUBJECTIVE:                                                                                                                                                                                           SUBJECTIVE STATEMENT: Patient reports that she has not been able to perform HEP with regularity due to packing up for a move. She is not doing any heavy lifting, but has been moving things around and is walking a lot.  PERTINENT HISTORY:  osteoporosis, HTN, diverticulitis   PAIN:  Are you having pain? Yes: NPRS scale: 0/10 Pain location: low  back and right lower leg Pain description: dull achy Aggravating factors: stairs Relieving factors: heat    PRECAUTIONS: None  WEIGHT BEARING RESTRICTIONS No  FALLS:  Has patient fallen in last 6 months? No   PLOF: Independent  PATIENT GOALS to have less pain and to prevent further injury   Treatment 09/01/21 Nu-Step L5 x 5 minutes. Sit to stand with  feet on Airex 2 x 10 reps with 4# ball overhead press. B side stepping against Yellow Tband, 2 x 10 reps B. Monster walks forward and back, 2 x 10' with Yellow Tband. Stretches-Active HS stretch, SKTC, DKTC, crossover ITB stretch. 3 x 10-15 seconds each. Crossover step ups on 6" step, 10 reps each leg.  08/27/21 Nustep L5, 75mns Step ups 6", CGA due to 1 LOB  Lateral step ups Trunk rotations 283ms Bridges w/band 2x10 Clamshells w/band 2x10 HS stretch 30s  Pirformis stretch 30s STS w/ball 2x10 Anti rotation pallof press 2x10 10# Lateral banded walks   08/25/21 Nustep L 5 5 min Red tband shld ext,row and ER ( too hard so instructed not to do at home) - issued as HEP Step up 10 x each leg 6 inch with BIL UE opp leg ext- postural cuing needed Step up laterally 10 x each with opp leg abd STS with wt ball press 10 x Trunk rotation 10 x each way wt ball Supine feet on ball bridge, KTC and obl 10 x each Supine isometric abs 15 x PROM/stretch BIL LE and trunk    PATIENT EDUCATION: Updated HEP  HOME EXERCISE PROGRAM: [id number]  ASSESSMENT:  CLINICAL IMPRESSION:   Patient reports no pain except occasionally at night. She wakes up with a sore back. She sleeps on her side. Therapist encouraged her to sleep with pillow between knees. She performed all activities without difficulty, reports she is moving and registered 13000 steps one day without any pain. HEP updated to include more options for strength and stretch. She would like to hold PT for several weeks with possible D/C if she remains pain free .   OBJECTIVE IMPAIRMENTS  decreased activity tolerance, decreased balance, decreased ROM, decreased strength, and pain.   ACTIVITY LIMITATIONS community activity and stairs .   PERSONAL FACTORS Age, Osteoarthritis, activity level are also affecting patient's functional outcome.    REHAB POTENTIAL: Good  CLINICAL DECISION MAKING: Stable/uncomplicated  EVALUATION COMPLEXITY: Low  GOALS: Goals reviewed with patient?  yes  SHORT TERM GOALS:  Patient will be independent in self management strategies to improve quality of life and functional outcomes. Baseline: new program Target date: 09/16/2021 Goal status:met  2.  Patient will report at least 50% improvement in overall symptoms and/or function to demonstrate improved functional mobility Baseline: 0% Target date: 09/16/2021 Goal status: met      LONG TERM GOALS:  Patient will report at least 75% improvement in overall symptoms and/or function to demonstrate improved functional mobility Baseline: 90% (+) Target date: 09/30/2021 Goal status: met  2.   Patient will meet predicted FOTO score to demonstrate improved overall function. Baseline: not met Target date: 10/14/2021 Goal status: ongoing  3.  Patient will be able to demonstrate painfree hip improve functional hip mobility Baseline: painful Target date: 10/14/2021 Goal status: met  4.  Patient will be able to ascend and descend stairs without pain to improve overall function  Baseline: unable Target date: 10/14/2021 Goal status: met     PLAN: PT FREQUENCY: 1-2x/week for a total fo 12 visits over 8 week certification  PT DURATION: 8 weeks  PLANNED INTERVENTIONS: Therapeutic exercises, Therapeutic activity, Neuromuscular re-education, Balance training, Gait training, Patient/Family education, Joint manipulation, Joint mobilization, Stair training, Dry Needling, Electrical stimulation, Spinal manipulation, Spinal mobilization, Cryotherapy, Moist heat, Ultrasound, Ionotophoresis 40m38ml  Dexamethasone, and Manual therapy.  PLAN FOR NEXT SESSION: Patient requesting to hold therapy for a few weeks with possible D/C if her symptoms remain  improved.  2:05 PM, 09/01/21 Ethel Rana DPT 09/01/21 2:05 PM  [facility]

## 2021-09-30 DIAGNOSIS — Z6821 Body mass index (BMI) 21.0-21.9, adult: Secondary | ICD-10-CM | POA: Diagnosis not present

## 2021-09-30 DIAGNOSIS — Z1231 Encounter for screening mammogram for malignant neoplasm of breast: Secondary | ICD-10-CM | POA: Diagnosis not present

## 2021-09-30 DIAGNOSIS — N816 Rectocele: Secondary | ICD-10-CM | POA: Diagnosis not present

## 2021-09-30 DIAGNOSIS — Z0142 Encounter for cervical smear to confirm findings of recent normal smear following initial abnormal smear: Secondary | ICD-10-CM | POA: Diagnosis not present

## 2021-09-30 DIAGNOSIS — Z01411 Encounter for gynecological examination (general) (routine) with abnormal findings: Secondary | ICD-10-CM | POA: Diagnosis not present

## 2021-09-30 DIAGNOSIS — Z124 Encounter for screening for malignant neoplasm of cervix: Secondary | ICD-10-CM | POA: Diagnosis not present

## 2021-09-30 DIAGNOSIS — Z01419 Encounter for gynecological examination (general) (routine) without abnormal findings: Secondary | ICD-10-CM | POA: Diagnosis not present

## 2021-10-02 LAB — HM MAMMOGRAPHY

## 2021-10-07 ENCOUNTER — Encounter: Payer: Self-pay | Admitting: Internal Medicine

## 2021-10-07 NOTE — Progress Notes (Signed)
Outside notes received. Information abstracted. Notes sent to scan.  

## 2022-04-25 NOTE — Progress Notes (Unsigned)
Subjective:    Patient ID: Sherry Mitchell, female    DOB: 04/08/1945, 77 y.o.   MRN: [id number]      HPI Niralya is here for a Physical exam.   Not sleeping good.  Lots of stress over the past year.  Tried melatonin. Difficulty falling asleep.  Nocturia x 2, then takes a little while to get back to sleep.  Does not feel tired during day.  Does not nap.     Medications and allergies reviewed with patient and updated if appropriate.  Current Outpatient Medications on File Prior to Visit  Medication Sig Dispense Refill   alendronate (FOSAMAX) 70 MG tablet Take 1 tablet (70 mg total) by mouth every 7 (seven) days. Take with a full glass of water on an empty stomach. 12 tablet 3   Cholecalciferol (VITAMIN D3) 50 MCG (2000 UT) TABS Take 2,000 Units by mouth daily.     diltiazem (DILT-XR) 240 MG 24 hr capsule Take 1 capsule (240 mg total) by mouth daily. 90 capsule 3   lisinopril-hydrochlorothiazide (ZESTORETIC) 20-25 MG tablet Take 1 tablet by mouth daily. 90 tablet 3   gabapentin (NEURONTIN) 100 MG capsule Take 1-3 capsules (100-300 mg total) by mouth at bedtime. (Patient not taking: Reported on 04/26/2022) 90 capsule 3   No current facility-administered medications on file prior to visit.    Review of Systems  Constitutional:  Negative for fever.  Eyes:  Negative for visual disturbance.  Respiratory:  Negative for cough, shortness of breath and wheezing.   Cardiovascular:  Negative for chest pain, palpitations and leg swelling.  Gastrointestinal:  Positive for constipation. Negative for abdominal pain, blood in stool, diarrhea and nausea.       No gerd  Genitourinary:  Negative for dysuria.  Musculoskeletal:  Positive for arthralgias (hands, knees). Negative for back pain.  Skin:  Negative for rash.  Neurological:  Negative for dizziness, light-headedness and headaches.  Psychiatric/Behavioral:  Positive for sleep disturbance. Negative for dysphoric mood. The patient is not  nervous/anxious.        Objective:   Vitals:   04/26/22 1353  BP: 124/72  Pulse: 60  Temp: 98.5 F (36.9 C)  SpO2: 100%   Filed Weights   04/26/22 1353  Weight: 139 lb (63 kg)   Body mass index is 22.44 kg/m.  BP Readings from Last 3 Encounters:  04/26/22 124/72  08/06/21 112/64  06/12/21 124/70    Wt Readings from Last 3 Encounters:  04/26/22 139 lb (63 kg)  08/06/21 135 lb 9.6 oz (61.5 kg)  06/12/21 138 lb 3.2 oz (62.7 kg)       Physical Exam Constitutional: She appears well-developed and well-nourished. No distress.  HENT:  Head: Normocephalic and atraumatic.  Right Ear: External ear normal. Normal ear canal and TM Left Ear: External ear normal.  Normal ear canal and TM Mouth/Throat: Oropharynx is clear and moist.  Eyes: Conjunctivae normal.  Neck: Neck supple. No tracheal deviation present. No thyromegaly present.  No carotid bruit  Cardiovascular: Normal rate, regular rhythm and normal heart sounds.   No murmur heard.  No edema. Pulmonary/Chest: Effort normal and breath sounds normal. No respiratory distress. She has no wheezes. She has no rales.  Breast: deferred   Abdominal: Soft. She exhibits no distension. There is no tenderness.  Lymphadenopathy: She has no cervical adenopathy.  Skin: Skin is warm and dry. She is not diaphoretic.  Psychiatric: She has a normal mood and affect. Her behavior is normal.  Lab Results  Component Value Date   WBC 5.2 04/22/2021   HGB 14.1 04/22/2021   HCT 43.0 04/22/2021   PLT 243.0 04/22/2021   GLUCOSE 95 04/22/2021   CHOL 194 04/22/2021   TRIG 81.0 04/22/2021   HDL 72.10 04/22/2021   LDLDIRECT 125.1 03/01/2013   LDLCALC 106 (H) 04/22/2021   ALT 13 04/22/2021   AST 16 04/22/2021   NA 138 04/22/2021   K 4.0 04/22/2021   CL 102 04/22/2021   CREATININE 0.66 04/22/2021   BUN 15 04/22/2021   CO2 31 04/22/2021   TSH 0.93 04/22/2020   HGBA1C 6.1 04/22/2021         Assessment & Plan:   Physical  exam: Screening blood work  ordered Exercise  doing a lot getting ready to move Weight  normal. Substance abuse  none   Reviewed recommended immunizations.   Health Maintenance  Topic Date Due   Medicare Annual Wellness (AWV)  12/04/2020   DTaP/Tdap/Td (2 - Td or Tdap) 02/25/2021   INFLUENZA VACCINE  10/20/2021   COVID-19 Vaccine (3 - 2023-24 season) 11/20/2021   COLONOSCOPY (Pts 45-41yr Insurance coverage will need to be confirmed)  04/17/2023   DEXA SCAN  04/28/2023   Pneumonia Vaccine 77 Years old  Completed   Hepatitis C Screening  Completed   Zoster Vaccines- Shingrix  Completed   HPV VACCINES  Aged Out          See Problem List for Assessment and Plan of chronic medical problems.

## 2022-04-25 NOTE — Patient Instructions (Signed)
Blood work was ordered.   The lab is on the first floor.    Medications changes include :   none     Return in about 1 year (around 04/27/2023) for Physical Exam.    Health Maintenance, Female Adopting a healthy lifestyle and getting preventive care are important in promoting health and wellness. Ask your health care provider about: The right schedule for you to have regular tests and exams. Things you can do on your own to prevent diseases and keep yourself healthy. What should I know about diet, weight, and exercise? Eat a healthy diet  Eat a diet that includes plenty of vegetables, fruits, low-fat dairy products, and lean protein. Do not eat a lot of foods that are high in solid fats, added sugars, or sodium. Maintain a healthy weight Body mass index (BMI) is used to identify weight problems. It estimates body fat based on height and weight. Your health care provider can help determine your BMI and help you achieve or maintain a healthy weight. Get regular exercise Get regular exercise. This is one of the most important things you can do for your health. Most adults should: Exercise for at least 150 minutes each week. The exercise should increase your heart rate and make you sweat (moderate-intensity exercise). Do strengthening exercises at least twice a week. This is in addition to the moderate-intensity exercise. Spend less time sitting. Even light physical activity can be beneficial. Watch cholesterol and blood lipids Have your blood tested for lipids and cholesterol at 77 years of age, then have this test every 5 years. Have your cholesterol levels checked more often if: Your lipid or cholesterol levels are high. You are older than 77 years of age. You are at high risk for heart disease. What should I know about cancer screening? Depending on your health history and family history, you may need to have cancer screening at various ages. This may include screening  for: Breast cancer. Cervical cancer. Colorectal cancer. Skin cancer. Lung cancer. What should I know about heart disease, diabetes, and high blood pressure? Blood pressure and heart disease High blood pressure causes heart disease and increases the risk of stroke. This is more likely to develop in people who have high blood pressure readings or are overweight. Have your blood pressure checked: Every 3-5 years if you are 46-66 years of age. Every year if you are 32 years old or older. Diabetes Have regular diabetes screenings. This checks your fasting blood sugar level. Have the screening done: Once every three years after age 85 if you are at a normal weight and have a low risk for diabetes. More often and at a younger age if you are overweight or have a high risk for diabetes. What should I know about preventing infection? Hepatitis B If you have a higher risk for hepatitis B, you should be screened for this virus. Talk with your health care provider to find out if you are at risk for hepatitis B infection. Hepatitis C Testing is recommended for: Everyone born from [birth year] through 1965. Anyone with known risk factors for hepatitis C. Sexually transmitted infections (STIs) Get screened for STIs, including gonorrhea and chlamydia, if: You are sexually active and are younger than 77 years of age. You are older than 77 years of age and your health care provider tells you that you are at risk for this type of infection. Your sexual activity has changed since you were last screened, and you are  at increased risk for chlamydia or gonorrhea. Ask your health care provider if you are at risk. Ask your health care provider about whether you are at high risk for HIV. Your health care provider may recommend a prescription medicine to help prevent HIV infection. If you choose to take medicine to prevent HIV, you should first get tested for HIV. You should then be tested every 3 months for as long as you  are taking the medicine. Pregnancy If you are about to stop having your period (premenopausal) and you may become pregnant, seek counseling before you get pregnant. Take 400 to 800 micrograms (mcg) of folic acid every day if you become pregnant. Ask for birth control (contraception) if you want to prevent pregnancy. Osteoporosis and menopause Osteoporosis is a disease in which the bones lose minerals and strength with aging. This can result in bone fractures. If you are 42 years old or older, or if you are at risk for osteoporosis and fractures, ask your health care provider if you should: Be screened for bone loss. Take a calcium or vitamin D supplement to lower your risk of fractures. Be given hormone replacement therapy (HRT) to treat symptoms of menopause. Follow these instructions at home: Alcohol use Do not drink alcohol if: Your health care provider tells you not to drink. You are pregnant, may be pregnant, or are planning to become pregnant. If you drink alcohol: Limit how much you have to: 0-1 drink a day. Know how much alcohol is in your drink. In the [place] one drink equals one 12 oz bottle of beer (355 mL), one 5 oz glass of wine (148 mL), or one 1 oz glass of hard liquor (44 mL). Lifestyle Do not use any products that contain nicotine or tobacco. These products include cigarettes, chewing tobacco, and vaping devices, such as e-cigarettes. If you need help quitting, ask your health care provider. Do not use street drugs. Do not share needles. Ask your health care provider for help if you need support or information about quitting drugs. General instructions Schedule regular health, dental, and eye exams. Stay current with your vaccines. Tell your health care provider if: You often feel depressed. You have ever been abused or do not feel safe at home. Summary Adopting a healthy lifestyle and getting preventive care are important in promoting health and wellness. Follow your  health care provider's instructions about healthy diet, exercising, and getting tested or screened for diseases. Follow your health care provider's instructions on monitoring your cholesterol and blood pressure. This information is not intended to replace advice given to you by your health care provider. Make sure you discuss any questions you have with your health care provider. Document Revised: 07/28/2020 Document Reviewed: 07/28/2020 Elsevier Patient Education  2023 [employer].

## 2022-04-26 ENCOUNTER — Encounter: Payer: Self-pay | Admitting: Internal Medicine

## 2022-04-26 ENCOUNTER — Ambulatory Visit (INDEPENDENT_AMBULATORY_CARE_PROVIDER_SITE_OTHER): Payer: PPO | Admitting: Internal Medicine

## 2022-04-26 VITALS — BP 124/72 | HR 60 | Temp 98.5°F | Ht 66.0 in | Wt 139.0 lb

## 2022-04-26 DIAGNOSIS — E7849 Other hyperlipidemia: Secondary | ICD-10-CM

## 2022-04-26 DIAGNOSIS — M810 Age-related osteoporosis without current pathological fracture: Secondary | ICD-10-CM | POA: Diagnosis not present

## 2022-04-26 DIAGNOSIS — R7303 Prediabetes: Secondary | ICD-10-CM | POA: Diagnosis not present

## 2022-04-26 DIAGNOSIS — I10 Essential (primary) hypertension: Secondary | ICD-10-CM

## 2022-04-26 DIAGNOSIS — Z0000 Encounter for general adult medical examination without abnormal findings: Secondary | ICD-10-CM | POA: Diagnosis not present

## 2022-04-26 DIAGNOSIS — G479 Sleep disorder, unspecified: Secondary | ICD-10-CM

## 2022-04-26 NOTE — Assessment & Plan Note (Signed)
Chronic Blood pressure well controlled CMP Continue diltiazem XR to 40 mg daily, lisinopril-HCTZ 20-25 mg daily

## 2022-04-26 NOTE — Assessment & Plan Note (Signed)
Chronic Regular exercise and healthy diet encouraged Check lipid panel  Continue lifestyle control 

## 2022-04-26 NOTE — Assessment & Plan Note (Signed)
Chronic On Fosamax, which was started 03/2019-ideally continue for 5 years DEXA up-to-date-has seen improvement with Fosamax Repeat DEXA in 1 year then reevaluate Fosamax Continue vitamin D daily

## 2022-04-26 NOTE — Assessment & Plan Note (Addendum)
Acute Over the past year or so Likely related to underlying stress Melatonin has not helped At this point she is doing okay and I expect this to improve on its own so no treatment needed-if it does not improve or gets worse she will let me know we can consider trazodone

## 2022-04-26 NOTE — Assessment & Plan Note (Signed)
Chronic Check a1c Low sugar / carb diet Stressed regular exercise  

## 2022-05-22 ENCOUNTER — Other Ambulatory Visit: Payer: Self-pay | Admitting: Internal Medicine

## 2022-05-25 ENCOUNTER — Other Ambulatory Visit: Payer: Self-pay | Admitting: Internal Medicine

## 2022-05-27 DIAGNOSIS — H5213 Myopia, bilateral: Secondary | ICD-10-CM | POA: Diagnosis not present

## 2022-05-27 DIAGNOSIS — H524 Presbyopia: Secondary | ICD-10-CM | POA: Diagnosis not present

## 2022-05-27 DIAGNOSIS — H40029 Open angle with borderline findings, high risk, unspecified eye: Secondary | ICD-10-CM | POA: Diagnosis not present

## 2022-05-27 DIAGNOSIS — H52223 Regular astigmatism, bilateral: Secondary | ICD-10-CM | POA: Diagnosis not present

## 2022-05-27 DIAGNOSIS — H2513 Age-related nuclear cataract, bilateral: Secondary | ICD-10-CM | POA: Diagnosis not present

## 2022-07-14 DIAGNOSIS — H25812 Combined forms of age-related cataract, left eye: Secondary | ICD-10-CM | POA: Diagnosis not present

## 2022-07-14 DIAGNOSIS — H2511 Age-related nuclear cataract, right eye: Secondary | ICD-10-CM | POA: Diagnosis not present

## 2022-07-14 DIAGNOSIS — H40053 Ocular hypertension, bilateral: Secondary | ICD-10-CM | POA: Diagnosis not present

## 2022-07-14 DIAGNOSIS — H43813 Vitreous degeneration, bilateral: Secondary | ICD-10-CM | POA: Diagnosis not present

## 2022-08-25 DIAGNOSIS — H25812 Combined forms of age-related cataract, left eye: Secondary | ICD-10-CM | POA: Diagnosis not present

## 2022-08-25 DIAGNOSIS — H2511 Age-related nuclear cataract, right eye: Secondary | ICD-10-CM | POA: Diagnosis not present

## 2022-08-25 DIAGNOSIS — H43813 Vitreous degeneration, bilateral: Secondary | ICD-10-CM | POA: Diagnosis not present

## 2022-08-25 DIAGNOSIS — H40053 Ocular hypertension, bilateral: Secondary | ICD-10-CM | POA: Diagnosis not present

## 2022-08-28 ENCOUNTER — Other Ambulatory Visit: Payer: Self-pay | Admitting: Internal Medicine

## 2022-09-01 ENCOUNTER — Other Ambulatory Visit: Payer: Self-pay | Admitting: Internal Medicine

## 2022-09-22 DIAGNOSIS — L821 Other seborrheic keratosis: Secondary | ICD-10-CM | POA: Diagnosis not present

## 2022-09-22 DIAGNOSIS — L814 Other melanin hyperpigmentation: Secondary | ICD-10-CM | POA: Diagnosis not present

## 2022-09-22 DIAGNOSIS — B360 Pityriasis versicolor: Secondary | ICD-10-CM | POA: Diagnosis not present

## 2022-09-22 DIAGNOSIS — Z85828 Personal history of other malignant neoplasm of skin: Secondary | ICD-10-CM | POA: Diagnosis not present

## 2022-09-22 DIAGNOSIS — D692 Other nonthrombocytopenic purpura: Secondary | ICD-10-CM | POA: Diagnosis not present

## 2022-09-22 DIAGNOSIS — L918 Other hypertrophic disorders of the skin: Secondary | ICD-10-CM | POA: Diagnosis not present

## 2022-10-20 ENCOUNTER — Encounter (INDEPENDENT_AMBULATORY_CARE_PROVIDER_SITE_OTHER): Payer: Self-pay

## 2023-01-10 ENCOUNTER — Ambulatory Visit (INDEPENDENT_AMBULATORY_CARE_PROVIDER_SITE_OTHER): Payer: PPO

## 2023-01-10 VITALS — Ht 66.0 in | Wt 133.0 lb

## 2023-01-10 DIAGNOSIS — Z0000 Encounter for general adult medical examination without abnormal findings: Secondary | ICD-10-CM | POA: Diagnosis not present

## 2023-01-10 NOTE — Patient Instructions (Signed)
Sherry Mitchell , Thank you for taking time to come for your Medicare Wellness Visit. I appreciate your ongoing commitment to your health goals. Please review the following plan we discussed and let me know if I can assist you in the future.   Referrals/Orders/Follow-Ups/Clinician Recommendations: You are due for a Tetanus and Flu vaccine, which both you can get at your pharmacy.  It was nice speaking with you today.  Keep up the good work  This is a list of the screening recommended for you and due dates:  Health Maintenance  Topic Date Due   DTaP/Tdap/Td vaccine (2 - Td or Tdap) 02/25/2021   Flu Shot  10/21/2022   Colon Cancer Screening  04/17/2023   COVID-19 Vaccine (4 - 2023-24 season) 03/03/2023   DEXA scan (bone density measurement)  04/28/2023   Medicare Annual Wellness Visit  01/10/2024   Pneumonia Vaccine  Completed   Hepatitis C Screening  Completed   Zoster (Shingles) Vaccine  Completed   HPV Vaccine  Aged Out    Advanced directives: (In Chart) A copy of your advanced directives are scanned into your chart should your provider ever need it.  Next Medicare Annual Wellness Visit scheduled for next year: Yes

## 2023-01-10 NOTE — Progress Notes (Signed)
Subjective:   Sherry Mitchell is a 77 y.o. female who presents for Medicare Annual (Subsequent) preventive examination.  Visit Complete: Virtual I connected with  Marca Ancona on 01/10/23 by a audio enabled telemedicine application and verified that I am speaking with the correct person using two identifiers.  Patient Location: Home  Provider Location: Office/Clinic  I discussed the limitations of evaluation and management by telemedicine. The patient expressed understanding and agreed to proceed.  Vital Signs: Because this visit was a virtual/telehealth visit, some criteria may be missing or patient reported. Any vitals not documented were not able to be obtained and vitals that have been documented are patient reported.   Cardiac Risk Factors include: advanced age (>48men, >22 women);dyslipidemia;hypertension     Objective:    Today's Vitals   01/10/23 0931  Weight: 133 lb (60.3 kg)  Height: 5\' 6"  (1.676 m)   Body mass index is 21.47 kg/m.     01/10/2023    9:46 AM 06/17/2021    2:40 PM 01/02/2020   11:37 PM 12/05/2019    1:20 PM 11/29/2018    1:12 PM 11/24/2017   11:21 AM 11/19/2016   11:26 AM  Advanced Directives  Does Patient Have a Medical Advance Directive? Yes Yes No Yes Yes Yes Yes  Type of Estate agent of [place];Living will   Healthcare Power of [place];Living will Healthcare Power of [place];Living will Healthcare Power of [place];Living will Healthcare Power of [place];Living will  Does patient want to make changes to medical advance directive? No - Patient declined No - Guardian declined  No - Patient declined     Copy of Healthcare Power of Attorney in Chart? Yes - validated most recent copy scanned in chart (See row information)    Yes - validated most recent copy scanned in chart (See row information) No - copy requested No - copy requested    Current Medications (verified) Outpatient Encounter Medications as of 01/10/2023   Medication Sig   acetaminophen (TYLENOL) 650 MG CR tablet Take 650 mg by mouth every 8 (eight) hours as needed for pain.   alendronate (FOSAMAX) 70 MG tablet TAKE ONE TABLET BY MOUTH EVERY SEVEN DAYS ON AN EMPTY STOMACH WITH A FULL GLASS OF WATER   Cholecalciferol (VITAMIN D3) 50 MCG (2000 UT) TABS Take 2,000 Units by mouth daily.   DILT-XR 240 MG 24 hr capsule TAKE ONE CAPSULE BY MOUTH ONE TIME DAILY   lisinopril-hydrochlorothiazide (ZESTORETIC) 20-25 MG tablet TAKE ONE TABLET BY MOUTH ONE TIME DAILY   No facility-administered encounter medications on file as of 01/10/2023.    Allergies (verified) Patient has no known allergies.   History: Past Medical History:  Diagnosis Date   Colon polyps 02/2003, 02/2009   Adenomatous polyps   Diverticulitis    Diverticulosis    Heart murmur    Hypertension    Osteoarthritis    hands   Osteopenia    DEXA 02/2011, 02/2013: -1.9   Pneumonia    Prediabetes    Past Surgical History:  Procedure Laterality Date   BREAST ENHANCEMENT SURGERY  1987   COLONOSCOPY     Family History  Problem Relation Age of Onset   Dementia Brother    Parkinson's disease Brother    Cancer Mother        cancer of saliva glands   Heart disease Other    Heart attack Father    Rheumatic fever Father    Colon cancer Neg Hx  Esophageal cancer Neg Hx    Stomach cancer Neg Hx    Rectal cancer Neg Hx    Social History   Socioeconomic History   Marital status: Married    Spouse name: Richard   Number of children: 2   Years of education: Not on file   Highest education level: Not on file  Occupational History   Occupation: retired  Tobacco Use   Smoking status: Never   Smokeless tobacco: Never  Vaping Use   Vaping status: Never Used  Substance and Sexual Activity   Alcohol use: Not Currently    Comment: wine with dinner   Drug use: No   Sexual activity: Not Currently  Other Topics Concern   Not on file  Social History Narrative   Lives with  her husband   Exercises regularly   Drinks 1 glass of wine with dinner   Social Determinants of Health   Financial Resource Strain: Low Risk  (01/10/2023)   Overall Financial Resource Strain (CARDIA)    Difficulty of Paying Living Expenses: Not hard at all  Food Insecurity: No Food Insecurity (01/10/2023)   Hunger Vital Sign    Worried About Running Out of Food in the Last Year: Never true    Ran Out of Food in the Last Year: Never true  Transportation Needs: No Transportation Needs (01/10/2023)   PRAPARE - Administrator, Civil Service (Medical): No    Lack of Transportation (Non-Medical): No  Physical Activity: Insufficiently Active (01/10/2023)   Exercise Vital Sign    Days of Exercise per Week: 3 days    Minutes of Exercise per Session: 30 min  Stress: No Stress Concern Present (01/10/2023)   [employer] of Occupational Health - Occupational Stress Questionnaire    Feeling of Stress : Not at all  Social Connections: Moderately Isolated (01/10/2023)   Social Connection and Isolation Panel [NHANES]    Frequency of Communication with Friends and Family: More than three times a week    Frequency of Social Gatherings with Friends and Family: Twice a week    Attends Religious Services: Never    Database administrator or Organizations: No    Attends Engineer, structural: Never    Marital Status: Married    Tobacco Counseling Counseling given: Not Answered   Clinical Intake:  Pre-visit preparation completed: Yes  Pain : No/denies pain     BMI - recorded: 21.47 Nutritional Status: BMI of 19-24  Normal Nutritional Risks: None Diabetes: No  How often do you need to have someone help you when you read instructions, pamphlets, or other written materials from your doctor or pharmacy?: 1 - Never  Interpreter Needed?: No  Information entered by :: Kei Langhorst, RMA   Activities of Daily Living    01/10/2023    9:44 AM  In your present state  of health, do you have any difficulty performing the following activities:  Hearing? 0  Vision? 0  Difficulty concentrating or making decisions? 0  Walking or climbing stairs? 0  Dressing or bathing? 0  Doing errands, shopping? 0  Comment Husband drives her  Preparing Food and eating ? N  Using the Toilet? N  In the past six months, have you accidently leaked urine? N  Do you have problems with loss of bowel control? N  Managing your Medications? N  Managing your Finances? N  Housekeeping or managing your Housekeeping? N    Patient Care Team: Pincus Sanes, MD as  PCP - General (Internal Medicine) Meryl Dare, MD (Gastroenterology) Shea Evans, MD (Obstetrics and Gynecology) Glenford Peers, [place] as Referring Physician (Optometry) Bufford Buttner, MD as Referring Physician (Dermatology) Glenford Peers, OD as Referring Physician (Optometry)  Indicate any recent Medical Services you may have received from other than [facility] providers in the past year (date may be approximate).     Assessment:   This is a routine wellness examination for Tashay.  Hearing/Vision screen Hearing Screening - Comments:: Denies hearing difficulties   Vision Screening - Comments:: Wears eyeglasses   Goals Addressed   None   Depression Screen    01/10/2023    9:51 AM 04/26/2022    1:53 PM 04/26/2021    9:36 AM 12/05/2019    1:21 PM 04/13/2019   10:56 AM 11/29/2018    1:12 PM 04/11/2018   11:07 AM  PHQ 2/9 Scores  PHQ - 2 Score 3 0 0 0 0 0 0  PHQ- 9 Score 6 3 1         Fall Risk    01/10/2023    9:47 AM 04/26/2022    1:53 PM 04/24/2021    1:44 PM 12/05/2019    1:21 PM 04/13/2019   10:56 AM  Fall Risk   Falls in the past year? 0 0 0 0 0  Number falls in past yr: 0 0 0 0 0  Injury with Fall? 0 0 0 0   Risk for fall due to : No Fall Risks No Fall Risks No Fall Risks No Fall Risks   Follow up Falls prevention discussed;Falls evaluation completed Falls evaluation completed Falls evaluation  completed Falls evaluation completed     MEDICARE RISK AT HOME: Medicare Risk at Home Any stairs in or around the home?: Yes If so, are there any without handrails?: Yes Home free of loose throw rugs in walkways, pet beds, electrical cords, etc?: Yes Adequate lighting in your home to reduce risk of falls?: Yes Life alert?: No Use of a cane, walker or w/c?: No Grab bars in the bathroom?: No Shower chair or bench in shower?: Yes Elevated toilet seat or a handicapped toilet?: Yes  TIMED UP AND GO:  Was the test performed?  No    Cognitive Function:        01/10/2023    9:47 AM  6CIT Screen  What Year? 0 points  What month? 0 points  What time? 0 points  Count back from 20 0 points  Months in reverse 0 points  Repeat phrase 0 points  Total Score 0 points    Immunizations Immunization History  Administered Date(s) Administered   Fluad Quad(high Dose 65+) 12/22/2018   Influenza Split 02/26/2011, 11/29/2011, 12/07/2013   Influenza, High Dose Seasonal PF 11/28/2012, 01/03/2015, 11/19/2016, 11/24/2017, 12/31/2021   Influenza-Unspecified 01/03/2015, 12/26/2015, 12/04/2019, 02/03/2021   PFIZER(Purple Top)SARS-COV-2 Vaccination 04/26/2019, 05/21/2019   Pneumococcal Conjugate-13 03/04/2014   Pneumococcal Polysaccharide-23 03/06/2015   Tdap 02/26/2011   Unspecified SARS-COV-2 Vaccination 01/06/2023   Zoster Recombinant(Shingrix) 01/09/2018, 03/10/2018    TDAP status: Due, Education has been provided regarding the importance of this vaccine. Advised may receive this vaccine at local pharmacy or Health Dept. Aware to provide a copy of the vaccination record if obtained from local pharmacy or Health Dept. Verbalized acceptance and understanding.  Flu Vaccine status: Due, Education has been provided regarding the importance of this vaccine. Advised may receive this vaccine at local pharmacy or Health Dept. Aware to provide a copy of the vaccination record if  obtained from local  pharmacy or Health Dept. Verbalized acceptance and understanding.  Pneumococcal vaccine status: Up to date  Covid-19 vaccine status: Completed vaccines  Qualifies for Shingles Vaccine? Yes   Zostavax completed Yes   Shingrix Completed?: Yes  Screening Tests Health Maintenance  Topic Date Due   DTaP/Tdap/Td (2 - Td or Tdap) 02/25/2021   INFLUENZA VACCINE  10/21/2022   Colonoscopy  04/17/2023   COVID-19 Vaccine (4 - 2023-24 season) 03/03/2023   DEXA SCAN  04/28/2023   Medicare Annual Wellness (AWV)  01/10/2024   Pneumonia Vaccine 16+ Years old  Completed   Hepatitis C Screening  Completed   Zoster Vaccines- Shingrix  Completed   HPV VACCINES  Aged Out    Health Maintenance  Health Maintenance Due  Topic Date Due   DTaP/Tdap/Td (2 - Td or Tdap) 02/25/2021   INFLUENZA VACCINE  10/21/2022   Colonoscopy  04/17/2023    Colorectal cancer screening: Type of screening: Colonoscopy. Completed 04/16/2020. Repeat every 3 years  Mammogram status: Completed 01/18/2023. Repeat every year  Bone Density status: Completed 04/27/2021. Results reflect: Bone density results: OSTEOPOROSIS. Repeat every 2 years.  Lung Cancer Screening: (Low Dose CT Chest recommended if Age 50-80 years, 20 pack-year currently smoking OR have quit w/in 15years.) does not qualify.   Lung Cancer Screening Referral: N/A  Additional Screening:  Hepatitis C Screening: does qualify; Completed 04/05/2016  Vision Screening: Recommended annual ophthalmology exams for early detection of glaucoma and other disorders of the eye. Is the patient up to date with their annual eye exam?  No  Who is the provider or what is the name of the office in which the patient attends annual eye exams? [facility] If pt is not established with a provider, would they like to be referred to a provider to establish care? No .   Dental Screening: Recommended annual dental exams for proper oral hygiene   Community Resource Referral /  Chronic Care Management: CRR required this visit?  No   CCM required this visit?  No     Plan:     I have personally reviewed and noted the following in the patient's chart:   Medical and social history Use of alcohol, tobacco or illicit drugs  Current medications and supplements including opioid prescriptions. Patient is not currently taking opioid prescriptions. Functional ability and status Nutritional status Physical activity Advanced directives List of other physicians Hospitalizations, surgeries, and ER visits in previous 12 months Vitals Screenings to include cognitive, depression, and falls Referrals and appointments  In addition, I have reviewed and discussed with patient certain preventive protocols, quality metrics, and best practice recommendations. A written personalized care plan for preventive services as well as general preventive health recommendations were provided to patient.     Caliope Ruppert L Takoda Siedlecki, CMA   01/10/2023   After Visit Summary: (MyChart) Due to this being a telephonic visit, the after visit summary with patients personalized plan was offered to patient via MyChart   Nurse Notes: Patient is due for a Flu vaccine and a Tdap, which she will be getting both soon at her pharmacy.  Patient stated that she has a appointment for a mammogram at the end of this month.  She also stated that she will have her eye care exam at the end of the year.  Patient had no other concerns to address today.

## 2023-01-18 DIAGNOSIS — Z124 Encounter for screening for malignant neoplasm of cervix: Secondary | ICD-10-CM | POA: Diagnosis not present

## 2023-01-18 DIAGNOSIS — Z1231 Encounter for screening mammogram for malignant neoplasm of breast: Secondary | ICD-10-CM | POA: Diagnosis not present

## 2023-01-18 DIAGNOSIS — N816 Rectocele: Secondary | ICD-10-CM | POA: Diagnosis not present

## 2023-01-18 LAB — HM MAMMOGRAPHY

## 2023-01-19 ENCOUNTER — Encounter: Payer: Self-pay | Admitting: Internal Medicine

## 2023-02-13 ENCOUNTER — Other Ambulatory Visit: Payer: Self-pay | Admitting: Internal Medicine

## 2023-02-19 ENCOUNTER — Other Ambulatory Visit: Payer: Self-pay | Admitting: Internal Medicine

## 2023-03-07 DIAGNOSIS — H43813 Vitreous degeneration, bilateral: Secondary | ICD-10-CM | POA: Diagnosis not present

## 2023-03-07 DIAGNOSIS — H25812 Combined forms of age-related cataract, left eye: Secondary | ICD-10-CM | POA: Diagnosis not present

## 2023-03-07 DIAGNOSIS — H40053 Ocular hypertension, bilateral: Secondary | ICD-10-CM | POA: Diagnosis not present

## 2023-03-07 DIAGNOSIS — H2511 Age-related nuclear cataract, right eye: Secondary | ICD-10-CM | POA: Diagnosis not present

## 2023-03-25 ENCOUNTER — Other Ambulatory Visit: Payer: Self-pay | Admitting: Internal Medicine

## 2023-04-28 NOTE — Progress Notes (Signed)
 Subjective:    Patient ID: Sherry Mitchell, female    DOB: 05-30-1945, 78 y.o.   MRN: [id number]      HPI Sherry Mitchell is here for a Physical exam and her chronic medical problems.   Not sleeping well-still working on cleaning up the house, will have in the state still in April and then sell the house.  OA worse - R knee is worse.  Not bad enough to see anyone at this point   Medications and allergies reviewed with patient and updated if appropriate.  Current Outpatient Medications on File Prior to Visit  Medication Sig Dispense Refill   acetaminophen  (TYLENOL ) 650 MG CR tablet Take 650 mg by mouth every 8 (eight) hours as needed for pain.     alendronate  (FOSAMAX ) 70 MG tablet take 1 tablet by mouth every 7 days on an empty stomach with a full glass of water 12 tablet 0   Cholecalciferol (VITAMIN D3) 50 MCG (2000 UT) TABS Take 2,000 Units by mouth daily.     diltiazem  (DILACOR XR ) 240 MG 24 hr capsule TAKE ONE CAPSULE BY MOUTH ONCE A DAY 90 capsule 0   latanoprost (XALATAN) 0.005 % ophthalmic solution SMARTSIG:In Eye(s)     lisinopril -hydrochlorothiazide  (ZESTORETIC ) 20-25 MG tablet TAKE ONE TABLET BY MOUTH ONCE A DAY 90 tablet 0   No current facility-administered medications on file prior to visit.    Review of Systems  Constitutional:  Negative for fever.  Eyes:  Negative for visual disturbance.  Respiratory:  Negative for cough, shortness of breath and wheezing.   Cardiovascular:  Negative for chest pain, palpitations and leg swelling.  Gastrointestinal:  Negative for abdominal pain, blood in stool, constipation and diarrhea.       No gerd  Genitourinary:  Negative for dysuria.  Musculoskeletal:  Positive for arthralgias (mild - right knee, hands). Negative for back pain.  Skin:  Negative for rash.  Neurological:  Negative for dizziness, light-headedness and headaches.  Psychiatric/Behavioral:  Negative for dysphoric mood. The patient is not nervous/anxious.         Objective:   Vitals:   04/29/23 1348  BP: 118/78  Pulse: 79  Temp: 98 F (36.7 C)  SpO2: 99%   Filed Weights   04/29/23 1348  Weight: 141 lb (64 kg)   Body mass index is 22.76 kg/m.  BP Readings from Last 3 Encounters:  04/29/23 118/78  04/26/22 124/72  08/06/21 112/64    Wt Readings from Last 3 Encounters:  04/29/23 141 lb (64 kg)  01/10/23 133 lb (60.3 kg)  04/26/22 139 lb (63 kg)       Physical Exam Constitutional: She appears well-developed and well-nourished. No distress.  HENT:  Head: Normocephalic and atraumatic.  Right Ear: External ear normal. Normal ear canal and TM Left Ear: External ear normal.  Normal ear canal and TM Mouth/Throat: Oropharynx is clear and moist.  Eyes: Conjunctivae normal.  Neck: Neck supple. No tracheal deviation present. No thyromegaly present.  No carotid bruit  Cardiovascular: Normal rate, regular rhythm and normal heart sounds.   No murmur heard.  No edema. Pulmonary/Chest: Effort normal and breath sounds normal. No respiratory distress. She has no wheezes. She has no rales.  Breast: deferred   Abdominal: Soft. She exhibits no distension. There is no tenderness.  Lymphadenopathy: She has no cervical adenopathy.  Skin: Skin is warm and dry. She is not diaphoretic.  Psychiatric: She has a normal mood and affect. Her behavior is normal.  Lab Results  Component Value Date   WBC 5.2 04/22/2021   HGB 14.1 04/22/2021   HCT 43.0 04/22/2021   PLT 243.0 04/22/2021   GLUCOSE 95 04/22/2021   CHOL 194 04/22/2021   TRIG 81.0 04/22/2021   HDL 72.10 04/22/2021   LDLDIRECT 125.1 03/01/2013   LDLCALC 106 (H) 04/22/2021   ALT 13 04/22/2021   AST 16 04/22/2021   NA 138 04/22/2021   K 4.0 04/22/2021   CL 102 04/22/2021   CREATININE 0.66 04/22/2021   BUN 15 04/22/2021   CO2 31 04/22/2021   TSH 0.93 04/22/2020   HGBA1C 6.1 04/22/2021         Assessment & Plan:   Physical exam: Screening blood work  ordered Exercise   active - will [facility] back [facility] Weight  normal Substance abuse  none   Reviewed recommended immunizations.   Health Maintenance  Topic Date Due   Colonoscopy  04/17/2023   DEXA SCAN  04/28/2023   Medicare Annual Wellness (AWV)  01/10/2024   DTaP/Tdap/Td (3 - Td or Tdap) 02/03/2033   Pneumonia Vaccine 45+ Years old  Completed   INFLUENZA VACCINE  Completed   COVID-19 Vaccine  Completed   Hepatitis C Screening  Completed   Zoster Vaccines- Shingrix  Completed   HPV VACCINES  Aged Out          See Problem List for Assessment and Plan of chronic medical problems.

## 2023-04-28 NOTE — Patient Instructions (Addendum)
 Call GI for your colonoscopy Phone: [phone]    Blood work was ordered.       Medications changes include :   trazodone  25-50 mg for sleep as needed     Return in about 1 year (around 04/28/2024) for Physical Exam, Schedule DEXA-[place].   Health Maintenance, Female Adopting a healthy lifestyle and getting preventive care are important in promoting health and wellness. Ask your health care provider about: The right schedule for you to have regular tests and exams. Things you can do on your own to prevent diseases and keep yourself healthy. What should I know about diet, weight, and exercise? Eat a healthy diet  Eat a diet that includes plenty of vegetables, fruits, low-fat dairy products, and lean protein. Do not eat a lot of foods that are high in solid fats, added sugars, or sodium. Maintain a healthy weight Body mass index (BMI) is used to identify weight problems. It estimates body fat based on height and weight. Your health care provider can help determine your BMI and help you achieve or maintain a healthy weight. Get regular exercise Get regular exercise. This is one of the most important things you can do for your health. Most adults should: Exercise for at least 150 minutes each week. The exercise should increase your heart rate and make you sweat (moderate-intensity exercise). Do strengthening exercises at least twice a week. This is in addition to the moderate-intensity exercise. Spend less time sitting. Even light physical activity can be beneficial. Watch cholesterol and blood lipids Have your blood tested for lipids and cholesterol at 78 years of age, then have this test every 5 years. Have your cholesterol levels checked more often if: Your lipid or cholesterol levels are high. You are older than 78 years of age. You are at high risk for heart disease. What should I know about cancer screening? Depending on your health history and family history, you may need  to have cancer screening at various ages. This may include screening for: Breast cancer. Cervical cancer. Colorectal cancer. Skin cancer. Lung cancer. What should I know about heart disease, diabetes, and high blood pressure? Blood pressure and heart disease High blood pressure causes heart disease and increases the risk of stroke. This is more likely to develop in people who have high blood pressure readings or are overweight. Have your blood pressure checked: Every 3-5 years if you are 10-19 years of age. Every year if you are 69 years old or older. Diabetes Have regular diabetes screenings. This checks your fasting blood sugar level. Have the screening done: Once every three years after age 28 if you are at a normal weight and have a low risk for diabetes. More often and at a younger age if you are overweight or have a high risk for diabetes. What should I know about preventing infection? Hepatitis B If you have a higher risk for hepatitis B, you should be screened for this virus. Talk with your health care provider to find out if you are at risk for hepatitis B infection. Hepatitis C Testing is recommended for: Everyone born from [birth year] through 1965. Anyone with known risk factors for hepatitis C. Sexually transmitted infections (STIs) Get screened for STIs, including gonorrhea and chlamydia, if: You are sexually active and are younger than 78 years of age. You are older than 78 years of age and your health care provider tells you that you are at risk for this type of infection. Your sexual activity has  changed since you were last screened, and you are at increased risk for chlamydia or gonorrhea. Ask your health care provider if you are at risk. Ask your health care provider about whether you are at high risk for HIV. Your health care provider may recommend a prescription medicine to help prevent HIV infection. If you choose to take medicine to prevent HIV, you should first get tested  for HIV. You should then be tested every 3 months for as long as you are taking the medicine. Pregnancy If you are about to stop having your period (premenopausal) and you may become pregnant, seek counseling before you get pregnant. Take 400 to 800 micrograms (mcg) of folic acid every day if you become pregnant. Ask for birth control (contraception) if you want to prevent pregnancy. Osteoporosis and menopause Osteoporosis is a disease in which the bones lose minerals and strength with aging. This can result in bone fractures. If you are 43 years old or older, or if you are at risk for osteoporosis and fractures, ask your health care provider if you should: Be screened for bone loss. Take a calcium or vitamin D  supplement to lower your risk of fractures. Be given hormone replacement therapy (HRT) to treat symptoms of menopause. Follow these instructions at home: Alcohol use Do not drink alcohol if: Your health care provider tells you not to drink. You are pregnant, may be pregnant, or are planning to become pregnant. If you drink alcohol: Limit how much you have to: 0-1 drink a day. Know how much alcohol is in your drink. In the [place] one drink equals one 12 oz bottle of beer (355 mL), one 5 oz glass of wine (148 mL), or one 1 oz glass of hard liquor (44 mL). Lifestyle Do not use any products that contain nicotine or tobacco. These products include cigarettes, chewing tobacco, and vaping devices, such as e-cigarettes. If you need help quitting, ask your health care provider. Do not use street drugs. Do not share needles. Ask your health care provider for help if you need support or information about quitting drugs. General instructions Schedule regular health, dental, and eye exams. Stay current with your vaccines. Tell your health care provider if: You often feel depressed. You have ever been abused or do not feel safe at home. Summary Adopting a healthy lifestyle and getting  preventive care are important in promoting health and wellness. Follow your health care provider's instructions about healthy diet, exercising, and getting tested or screened for diseases. Follow your health care provider's instructions on monitoring your cholesterol and blood pressure. This information is not intended to replace advice given to you by your health care provider. Make sure you discuss any questions you have with your health care provider. Document Revised: 07/28/2020 Document Reviewed: 07/28/2020 Elsevier Patient Education  2024 [employer].

## 2023-04-29 ENCOUNTER — Ambulatory Visit (INDEPENDENT_AMBULATORY_CARE_PROVIDER_SITE_OTHER): Payer: PPO | Admitting: Internal Medicine

## 2023-04-29 ENCOUNTER — Encounter: Payer: Self-pay | Admitting: Internal Medicine

## 2023-04-29 VITALS — BP 118/78 | HR 79 | Temp 98.0°F | Ht 66.0 in | Wt 141.0 lb

## 2023-04-29 DIAGNOSIS — E7849 Other hyperlipidemia: Secondary | ICD-10-CM | POA: Diagnosis not present

## 2023-04-29 DIAGNOSIS — R7303 Prediabetes: Secondary | ICD-10-CM | POA: Diagnosis not present

## 2023-04-29 DIAGNOSIS — I10 Essential (primary) hypertension: Secondary | ICD-10-CM

## 2023-04-29 DIAGNOSIS — G479 Sleep disorder, unspecified: Secondary | ICD-10-CM | POA: Diagnosis not present

## 2023-04-29 DIAGNOSIS — M810 Age-related osteoporosis without current pathological fracture: Secondary | ICD-10-CM

## 2023-04-29 DIAGNOSIS — Z0000 Encounter for general adult medical examination without abnormal findings: Secondary | ICD-10-CM | POA: Diagnosis not present

## 2023-04-29 MED ORDER — LISINOPRIL-HYDROCHLOROTHIAZIDE 20-25 MG PO TABS: 1.0000 | ORAL_TABLET | Freq: Every day | ORAL | 3 refills | Status: AC

## 2023-04-29 MED ORDER — DILTIAZEM HCL ER 240 MG PO CP24: 240.0000 mg | ORAL_CAPSULE | Freq: Every day | ORAL | 3 refills | Status: AC

## 2023-04-29 MED ORDER — TRAZODONE HCL 50 MG PO TABS: 25.0000 mg | ORAL_TABLET | Freq: Every day | ORAL | 5 refills | Status: AC

## 2023-04-29 NOTE — Assessment & Plan Note (Signed)
Chronic Check a1c Low sugar / carb diet Stressed regular exercise  

## 2023-04-29 NOTE — Assessment & Plan Note (Signed)
 Chronic On Fosamax , which was started 03/2019-ideally continue for 5 years DEXA due-ordered Continue vitamin D  daily She is very active-encouraged regular exercise

## 2023-04-29 NOTE — Assessment & Plan Note (Addendum)
 Chronic Has been having sleep issues over the last year Likely related to stress with moving out of her house, cleaning out the house which is taken a year and now working on selling it 3 nights a week - not sleeping well Melatonin has not helped Will start trazodone  25-50 mg daily prn She will update me if this is not effective

## 2023-04-29 NOTE — Assessment & Plan Note (Signed)
 Chronic Blood pressure well controlled CMP, CBC Continue diltiazem  XR to 40 mg daily, lisinopril -HCTZ 20-25 mg daily

## 2023-04-29 NOTE — Assessment & Plan Note (Signed)
Chronic Regular exercise and healthy diet encouraged Check lipid panel, CMP, CBC, TSH Continue lifestyle control 

## 2023-05-19 ENCOUNTER — Encounter: Payer: Self-pay | Admitting: Internal Medicine

## 2023-05-19 ENCOUNTER — Other Ambulatory Visit (INDEPENDENT_AMBULATORY_CARE_PROVIDER_SITE_OTHER): Payer: PPO

## 2023-05-19 DIAGNOSIS — I10 Essential (primary) hypertension: Secondary | ICD-10-CM

## 2023-05-19 DIAGNOSIS — M810 Age-related osteoporosis without current pathological fracture: Secondary | ICD-10-CM

## 2023-05-19 DIAGNOSIS — R7303 Prediabetes: Secondary | ICD-10-CM | POA: Diagnosis not present

## 2023-05-19 DIAGNOSIS — E7849 Other hyperlipidemia: Secondary | ICD-10-CM | POA: Diagnosis not present

## 2023-05-19 LAB — COMPREHENSIVE METABOLIC PANEL
ALT: 11 U/L (ref 0–35)
AST: 14 U/L (ref 0–37)
Albumin: 4.3 g/dL (ref 3.5–5.2)
Alkaline Phosphatase: 53 U/L (ref 39–117)
BUN: 16 mg/dL (ref 6–23)
CO2: 30 meq/L (ref 19–32)
Calcium: 9.3 mg/dL (ref 8.4–10.5)
Chloride: 101 meq/L (ref 96–112)
Creatinine, Ser: 0.75 mg/dL (ref 0.40–1.20)
GFR: 76.59 mL/min (ref >60.00)
Glucose, Bld: 116 mg/dL — ABNORMAL HIGH (ref 70–99)
Potassium: 4.7 meq/L (ref 3.5–5.1)
Sodium: 137 meq/L (ref 135–145)
Total Bilirubin: 0.8 mg/dL (ref 0.2–1.2)
Total Protein: 7 g/dL (ref 6.0–8.3)

## 2023-05-19 LAB — LIPID PANEL
Cholesterol: 187 mg/dL (ref 0–200)
HDL: 82.1 mg/dL (ref >39.00)
LDL Cholesterol: 92 mg/dL (ref 0–99)
NonHDL: 104.87
Total CHOL/HDL Ratio: 2
Triglycerides: 64 mg/dL (ref 0.0–149.0)
VLDL: 12.8 mg/dL (ref 0.0–40.0)

## 2023-05-19 LAB — CBC WITH DIFFERENTIAL/PLATELET
Basophils Absolute: 0.1 10*3/uL (ref 0.0–0.1)
Basophils Relative: 1.2 % (ref 0.0–3.0)
Eosinophils Absolute: 0.1 10*3/uL (ref 0.0–0.7)
Eosinophils Relative: 2.2 % (ref 0.0–5.0)
HCT: 41.2 % (ref 36.0–46.0)
Hemoglobin: 13.8 g/dL (ref 12.0–15.0)
Lymphocytes Relative: 33.8 % (ref 12.0–46.0)
Lymphs Abs: 1.9 10*3/uL (ref 0.7–4.0)
MCHC: 33.6 g/dL (ref 30.0–36.0)
MCV: 91.8 fL (ref 78.0–100.0)
Monocytes Absolute: 0.8 10*3/uL (ref 0.1–1.0)
Monocytes Relative: 13.6 % — ABNORMAL HIGH (ref 3.0–12.0)
Neutro Abs: 2.8 10*3/uL (ref 1.4–7.7)
Neutrophils Relative %: 49.2 % (ref 43.0–77.0)
Platelets: 277 10*3/uL (ref 150.0–400.0)
RBC: 4.49 Mil/uL (ref 3.87–5.11)
RDW: 13.2 % (ref 11.5–15.5)
WBC: 5.7 10*3/uL (ref 4.0–10.5)

## 2023-05-19 LAB — VITAMIN D 25 HYDROXY (VIT D DEFICIENCY, FRACTURES): VITD: 43.42 ng/mL (ref 30.00–100.00)

## 2023-05-19 LAB — HEMOGLOBIN A1C: Hgb A1c MFr Bld: 6.1 % (ref 4.6–6.5)

## 2023-06-27 ENCOUNTER — Ambulatory Visit (INDEPENDENT_AMBULATORY_CARE_PROVIDER_SITE_OTHER)
Admission: RE | Admit: 2023-06-27 | Discharge: 2023-06-27 | Disposition: A | Discharge status: Home / Self Care | Source: Ambulatory Visit | Attending: Internal Medicine

## 2023-06-27 DIAGNOSIS — M810 Age-related osteoporosis without current pathological fracture: Secondary | ICD-10-CM | POA: Diagnosis not present

## 2023-07-02 ENCOUNTER — Encounter: Payer: Self-pay | Admitting: Internal Medicine

## 2023-07-05 ENCOUNTER — Other Ambulatory Visit: Payer: Self-pay | Admitting: Internal Medicine

## 2023-10-03 ENCOUNTER — Other Ambulatory Visit: Payer: Self-pay | Admitting: Internal Medicine

## 2023-10-03 MED ORDER — ALENDRONATE SODIUM 70 MG PO TABS: 70.0000 mg | ORAL_TABLET | ORAL | 0 refills | Status: DC

## 2023-10-03 NOTE — Telephone Encounter (Signed)
 Copied from CRM [id number]. Topic: Clinical - Medication Refill >> Oct 03, 2023 10:12 AM Precious C wrote: Medication: alendronate  (FOSAMAX ) 70 MG tablet  Has the patient contacted their pharmacy? Yes (Agent: If no, request that the patient contact the pharmacy for the refill. If patient does not wish to contact the pharmacy document the reason why and proceed with request.) (Agent: If yes, when and what did the pharmacy advise?) pt stated pharmacy did receive a RX refill for medication stated above  This is the patient's preferred pharmacy:  [facility] [address], [place] - [address] [place] [address] [place] [address] Phone: [phone] Fax: [phone]  Is this the correct pharmacy for this prescription? Yes If no, delete pharmacy and type the correct one.   Has the prescription been filled recently? No  Is the patient out of the medication? No  Has the patient been seen for an appointment in the last year OR does the patient have an upcoming appointment? Yes  Can we respond through MyChart? Yes  Agent: Please be advised that Rx refills may take up to 3 business days. We ask that you follow-up with your pharmacy.

## 2023-10-12 ENCOUNTER — Encounter: Payer: Self-pay | Admitting: Pediatrics

## 2023-10-26 ENCOUNTER — Encounter: Payer: Self-pay | Admitting: Pediatrics

## 2023-10-26 ENCOUNTER — Ambulatory Visit (AMBULATORY_SURGERY_CENTER)

## 2023-10-26 VITALS — Ht 66.0 in | Wt 133.0 lb

## 2023-10-26 DIAGNOSIS — Z860100 Personal history of colon polyps, unspecified: Secondary | ICD-10-CM

## 2023-10-26 MED ORDER — NA SULFATE-K SULFATE-MG SULF 17.5-3.13-1.6 GM/177ML PO SOLN: 1.0000 | Freq: Once | ORAL | 0 refills | Status: AC

## 2023-10-26 NOTE — Progress Notes (Signed)

## 2023-10-31 DIAGNOSIS — L280 Lichen simplex chronicus: Secondary | ICD-10-CM | POA: Diagnosis not present

## 2023-10-31 DIAGNOSIS — L814 Other melanin hyperpigmentation: Secondary | ICD-10-CM | POA: Diagnosis not present

## 2023-10-31 DIAGNOSIS — R208 Other disturbances of skin sensation: Secondary | ICD-10-CM | POA: Diagnosis not present

## 2023-10-31 DIAGNOSIS — Z85828 Personal history of other malignant neoplasm of skin: Secondary | ICD-10-CM | POA: Diagnosis not present

## 2023-10-31 DIAGNOSIS — L821 Other seborrheic keratosis: Secondary | ICD-10-CM | POA: Diagnosis not present

## 2023-11-07 NOTE — Progress Notes (Unsigned)
  Gastroenterology History and Physical   Primary Care Physician:  Geofm Glade PARAS, MD   Reason for Procedure:  History of sessile serrated polyps  Plan:    Colonoscopy     HPI: Sherry Mitchell is a 78 y.o. female undergoing surveillance colonoscopy for history of colon polyps.  Last colonoscopy was performed in 2022.  4 polyps were identified which were sessile serrated polyps on pathology.  Previous colonoscopy in 2015 disclosed 1 hyperplastic polyp.  No family history of colorectal cancer or polyps.  Patient denies current symptoms of change in bowel habits or rectal bleeding at the time of this exam.   Past Medical History:  Diagnosis Date   Colon polyps 02/2003, 02/2009   Adenomatous polyps   Diverticulitis    Diverticulosis    Heart murmur    Hypertension    Osteoarthritis    hands   Osteopenia    DEXA 02/2011, 02/2013: -1.9   Pneumonia    Prediabetes     Past Surgical History:  Procedure Laterality Date   BREAST ENHANCEMENT SURGERY  1987   COLONOSCOPY      Prior to Admission medications   Medication Sig Start Date End Date Taking? Authorizing Provider  acetaminophen  (TYLENOL ) 650 MG CR tablet Take 650 mg by mouth every 8 (eight) hours as needed for pain.    [provider]  alendronate  (FOSAMAX ) 70 MG tablet Take 1 tablet (70 mg total) by mouth once a week. Take with a full glass of water on an empty stomach. 10/03/23   Geofm Glade PARAS, MD  Cholecalciferol (VITAMIN D3) 50 MCG (2000 UT) TABS Take 2,000 Units by mouth daily.    [provider]  diltiazem  (DILACOR XR ) 240 MG 24 hr capsule Take 1 capsule (240 mg total) by mouth daily. 04/29/23   Geofm Glade PARAS, MD  latanoprost (XALATAN) 0.005 % ophthalmic solution SMARTSIG:In Eye(s) 04/27/23   [provider]  lisinopril -hydrochlorothiazide  (ZESTORETIC ) 20-25 MG tablet Take 1 tablet by mouth daily. 04/29/23   Geofm Glade PARAS, MD  traZODone  (DESYREL ) 50 MG tablet Take 0.5-1 tablets (25-50 mg  total) by mouth at bedtime. 04/29/23   Geofm Glade PARAS, MD    Current Outpatient Medications  Medication Sig Dispense Refill   alendronate  (FOSAMAX ) 70 MG tablet Take 1 tablet (70 mg total) by mouth once a week. Take with a full glass of water on an empty stomach. 12 tablet 0   Cholecalciferol (VITAMIN D3) 50 MCG (2000 UT) TABS Take 2,000 Units by mouth daily.     diltiazem  (DILACOR XR ) 240 MG 24 hr capsule Take 1 capsule (240 mg total) by mouth daily. 90 capsule 3   latanoprost (XALATAN) 0.005 % ophthalmic solution SMARTSIG:In Eye(s)     lisinopril -hydrochlorothiazide  (ZESTORETIC ) 20-25 MG tablet Take 1 tablet by mouth daily. 90 tablet 3   traZODone  (DESYREL ) 50 MG tablet Take 0.5-1 tablets (25-50 mg total) by mouth at bedtime. 30 tablet 5   acetaminophen  (TYLENOL ) 650 MG CR tablet Take 650 mg by mouth every 8 (eight) hours as needed for pain.     Current Facility-Administered Medications  Medication Dose Route Frequency Provider Last Rate Last Admin   0.9 %  sodium chloride  infusion  500 mL Intravenous Continuous Twylah Bennetts, Inocente HERO, MD        Allergies as of 11/08/2023   (No Known Allergies)    Family History  Problem Relation Age of Onset   Dementia Brother    Parkinson's disease Brother  Cancer Mother        cancer of saliva glands   Heart disease Other    Heart attack Father    Rheumatic fever Father    Colon cancer Neg Hx    Esophageal cancer Neg Hx    Stomach cancer Neg Hx    Rectal cancer Neg Hx     Social History   Socioeconomic History   Marital status: Married    Spouse name: Richard   Number of children: 2   Years of education: Not on file   Highest education level: Not on file  Occupational History   Occupation: retired  Tobacco Use   Smoking status: Never   Smokeless tobacco: Never  Vaping Use   Vaping status: Never Used  Substance and Sexual Activity   Alcohol use: Not Currently    Comment: wine with dinner   Drug use: No   Sexual activity: Not  Currently  Other Topics Concern   Not on file  Social History Narrative   Lives with her husband   Exercises regularly   Drinks 1 glass of wine with dinner   Social Drivers of Health   Financial Resource Strain: Low Risk  (01/10/2023)   Overall Financial Resource Strain (CARDIA)    Difficulty of Paying Living Expenses: Not hard at all  Food Insecurity: No Food Insecurity (01/10/2023)   Hunger Vital Sign    Worried About Running Out of Food in the Last Year: Never true    Ran Out of Food in the Last Year: Never true  Transportation Needs: No Transportation Needs (01/10/2023)   PRAPARE - Administrator, Civil Service (Medical): No    Lack of Transportation (Non-Medical): No  Physical Activity: Insufficiently Active (01/10/2023)   Exercise Vital Sign    Days of Exercise per Week: 3 days    Minutes of Exercise per Session: 30 min  Stress: No Stress Concern Present (01/10/2023)   [employer] of Occupational Health - Occupational Stress Questionnaire    Feeling of Stress : Not at all  Social Connections: Moderately Isolated (01/10/2023)   Social Connection and Isolation Panel    Frequency of Communication with Friends and Family: More than three times a week    Frequency of Social Gatherings with Friends and Family: Twice a week    Attends Religious Services: Never    Database administrator or Organizations: No    Attends Banker Meetings: Never    Marital Status: Married  Catering manager Violence: Not At Risk (01/10/2023)   Humiliation, Afraid, Rape, and Kick questionnaire    Fear of Current or Ex-Partner: No    Emotionally Abused: No    Physically Abused: No    Sexually Abused: No    Review of Systems:  All other review of systems negative except as mentioned in the HPI.  Physical Exam: Vital signs BP (!) 177/77   Pulse 62   Temp (!) 97.4 F (36.3 C)   Resp 14   Ht 5' 6 (1.676 m)   Wt 133 lb (60.3 kg)   SpO2 98%   BMI 21.47 kg/m    General:   Alert,  Well-developed, well-nourished, pleasant and cooperative in NAD Airway:  Mallampati 2 Lungs:  Clear throughout to auscultation.   Heart:  Regular rate and rhythm; no murmurs, clicks, rubs,  or gallops. Abdomen:  Soft, nontender and nondistended. Normal bowel sounds.   Neuro/Psych:  Normal mood and affect. A and O x 3  Inocente Hausen, MD [facility] Gastroenterology

## 2023-11-08 ENCOUNTER — Ambulatory Visit: Admitting: Pediatrics

## 2023-11-08 ENCOUNTER — Encounter: Payer: Self-pay | Admitting: Pediatrics

## 2023-11-08 VITALS — BP 146/69 | HR 56 | Temp 97.4°F | Resp 12 | Ht 66.0 in | Wt 133.0 lb

## 2023-11-08 DIAGNOSIS — D123 Benign neoplasm of transverse colon: Secondary | ICD-10-CM

## 2023-11-08 DIAGNOSIS — Z1211 Encounter for screening for malignant neoplasm of colon: Secondary | ICD-10-CM

## 2023-11-08 DIAGNOSIS — K5730 Diverticulosis of large intestine without perforation or abscess without bleeding: Secondary | ICD-10-CM

## 2023-11-08 DIAGNOSIS — Z860101 Personal history of adenomatous and serrated colon polyps: Secondary | ICD-10-CM

## 2023-11-08 DIAGNOSIS — K648 Other hemorrhoids: Secondary | ICD-10-CM

## 2023-11-08 DIAGNOSIS — D122 Benign neoplasm of ascending colon: Secondary | ICD-10-CM | POA: Diagnosis not present

## 2023-11-08 DIAGNOSIS — R7303 Prediabetes: Secondary | ICD-10-CM | POA: Diagnosis not present

## 2023-11-08 DIAGNOSIS — I10 Essential (primary) hypertension: Secondary | ICD-10-CM | POA: Diagnosis not present

## 2023-11-08 DIAGNOSIS — Z860100 Personal history of colon polyps, unspecified: Secondary | ICD-10-CM

## 2023-11-08 MED ORDER — SODIUM CHLORIDE 0.9 % IV SOLN: 500.0000 mL | INTRAVENOUS | Status: DC

## 2023-11-08 NOTE — Patient Instructions (Signed)
 Thank you for letting us  care for your healthcare needs today! Please see handouts regarding Polyps, Hemorrhoids & Diverticulosis. Await pathology results.   YOU HAD AN ENDOSCOPIC PROCEDURE TODAY AT THE [facility]:   Refer to the procedure report that was given to you for any specific questions about what was found during the examination.  If the procedure report does not answer your questions, please call your gastroenterologist to clarify.  If you requested that your care partner not be given the details of your procedure findings, then the procedure report has been included in a sealed envelope for you to review at your convenience later.  YOU SHOULD EXPECT: Some feelings of bloating in the abdomen. Passage of more gas than usual.  Walking can help get rid of the air that was put into your GI tract during the procedure and reduce the bloating. If you had a lower endoscopy (such as a colonoscopy or flexible sigmoidoscopy) you may notice spotting of blood in your stool or on the toilet paper. If you underwent a bowel prep for your procedure, you may not have a normal bowel movement for a few days.  Please Note:  You might notice some irritation and congestion in your nose or some drainage.  This is from the oxygen used during your procedure.  There is no need for concern and it should clear up in a day or so.  SYMPTOMS TO REPORT IMMEDIATELY:  Following lower endoscopy (colonoscopy or flexible sigmoidoscopy):  Excessive amounts of blood in the stool  Significant tenderness or worsening of abdominal pains  Swelling of the abdomen that is new, acute  Fever of 100F or higher  For urgent or emergent issues, a gastroenterologist can be reached at any hour by calling [phone]. Do not use MyChart messaging for urgent concerns.    DIET:  We do recommend a small meal at first, but then you may proceed to your regular diet.  Drink plenty of fluids but you should avoid alcoholic  beverages for 24 hours.  ACTIVITY:  You should plan to take it easy for the rest of today and you should NOT DRIVE or use heavy machinery until tomorrow (because of the sedation medicines used during the test).    FOLLOW UP: Our staff will call the number listed on your records the next business day following your procedure.  We will call around 7:15- 8:00 am to check on you and address any questions or concerns that you may have regarding the information given to you following your procedure. If we do not reach you, we will leave a message.     If any biopsies were taken you will be contacted by phone or by letter within the next 1-3 weeks.  Please call us  at [phone] if you have not heard about the biopsies in 3 weeks.    SIGNATURES/CONFIDENTIALITY: You and/or your care partner have signed paperwork which will be entered into your electronic medical record.  These signatures attest to the fact that that the information above on your After Visit Summary has been reviewed and is understood.  Full responsibility of the confidentiality of this discharge information lies with you and/or your care-partner.

## 2023-11-08 NOTE — Op Note (Signed)
 [facility] Patient Name: Sherry Mitchell Procedure Date: 11/08/2023 1:58 PM MRN: [id number] Endoscopist: Inocente Hausen , MD, [id number] Age: 78 Referring MD:  Date of Birth: 10/03/1945 Gender: Female Account #: [id number] Procedure:                Colonoscopy Indications:              High risk colon cancer surveillance: Personal                            history of sessile serrated colon polyp (less than                            10 mm in size) with no dysplasia, Last colonoscopy:                            January 2022 Medicines:                Monitored Anesthesia Care Procedure:                Pre-Anesthesia Assessment:                           - Prior to the procedure, a History and Physical                            was performed, and patient medications and                            allergies were reviewed. The patient's tolerance of                            previous anesthesia was also reviewed. The risks                            and benefits of the procedure and the sedation                            options and risks were discussed with the patient.                            All questions were answered, and informed consent                            was obtained. Prior Anticoagulants: The patient has                            taken no anticoagulant or antiplatelet agents. ASA                            Grade Assessment: II - A patient with mild systemic                            disease. After reviewing the risks and benefits,  the patient was deemed in satisfactory condition to                            undergo the procedure.                           After obtaining informed consent, the colonoscope                            was passed under direct vision. Throughout the                            procedure, the patient's blood pressure, pulse, and                            oxygen saturations were monitored continuously.  The                            [id number]-HQ190L Pediatric Colonoscope [id number] was                            introduced through the anus and advanced to the                            cecum, identified by appendiceal orifice and                            ileocecal valve. The colonoscopy was performed                            without difficulty. An abdominal binder was                            utilized to facilitate the procedure given previous                            report of a tortuous colon and looping. The patient                            tolerated the procedure well. The quality of the                            bowel preparation was good. The ileocecal valve,                            appendiceal orifice, and rectum were photographed. Scope In: 2:04:41 PM Scope Out: 2:26:36 PM Scope Withdrawal Time: 0 hours 16 minutes 19 seconds  Total Procedure Duration: 0 hours 21 minutes 55 seconds  Findings:                 Hemorrhoids were found on perianal exam.                           The digital rectal exam was normal. Pertinent  negatives include normal sphincter tone and no                            palpable rectal lesions.                           Three sessile polyps were found in the hepatic                            flexure and ascending colon. The polyps were 3 to 4                            mm in size. These polyps were removed with a cold                            biopsy forceps. Resection and retrieval were                            complete.                           A 5 mm polyp was found in the transverse colon. The                            polyp was sessile. The polyp was removed with a                            cold snare. Resection and retrieval were complete.                           A few small-mouthed diverticula were found in the                            sigmoid colon.                           Internal hemorrhoids were found  during retroflexion. Complications:            No immediate complications. Estimated blood loss:                            Minimal. Estimated Blood Loss:     Estimated blood loss was minimal. Impression:               - Hemorrhoids found on perianal exam.                           - Three 3 to 4 mm polyps at the hepatic flexure and                            in the ascending colon, removed with a cold biopsy                            forceps. Resected and retrieved.                           -  One 5 mm polyp in the transverse colon, removed                            with a cold snare. Resected and retrieved.                           - Diverticulosis in the sigmoid colon.                           - Internal hemorrhoids. Recommendation:           - Discharge patient to home (ambulatory).                           - Await pathology results.                           - The findings and recommendations were discussed                            with the patient's family.                           - Return to referring physician. Inocente Hausen, MD 11/08/2023 2:35:11 PM This report has been signed electronically.

## 2023-11-08 NOTE — Progress Notes (Signed)
 Called to room to assist during endoscopic procedure.  Patient ID and intended procedure confirmed with present staff. Received instructions for my participation in the procedure from the performing physician.

## 2023-11-08 NOTE — Progress Notes (Signed)
 Report given to PACU, vss

## 2023-11-08 NOTE — Progress Notes (Signed)
 Pt's states no medical or surgical changes since previsit or office visit.

## 2023-11-09 ENCOUNTER — Telehealth: Payer: Self-pay

## 2023-11-09 NOTE — Telephone Encounter (Signed)
  Follow up Call-     11/08/2023    1:07 PM  Call back number  Post procedure Call Back phone  # [phone]  Permission to leave phone message Yes     Patient questions:  Do you have a fever, pain , or abdominal swelling? No. Pain Score  0 *  Have you tolerated food without any problems? Yes.    Have you been able to return to your normal activities? Yes.    Do you have any questions about your discharge instructions: Diet   No. Medications  No. Follow up visit  No.  Do you have questions or concerns about your Care? No.  Actions: * If pain score is 4 or above: No action needed, pain <4.

## 2023-11-11 LAB — SURGICAL PATHOLOGY

## 2023-11-13 ENCOUNTER — Ambulatory Visit: Payer: Self-pay | Admitting: Pediatrics

## 2023-12-23 DIAGNOSIS — M25561 Pain in right knee: Secondary | ICD-10-CM | POA: Diagnosis not present

## 2023-12-23 DIAGNOSIS — M25562 Pain in left knee: Secondary | ICD-10-CM | POA: Diagnosis not present

## 2023-12-24 ENCOUNTER — Other Ambulatory Visit: Payer: Self-pay | Admitting: Internal Medicine

## 2024-01-17 ENCOUNTER — Ambulatory Visit (INDEPENDENT_AMBULATORY_CARE_PROVIDER_SITE_OTHER): Payer: PPO

## 2024-01-17 VITALS — Ht 66.0 in | Wt 133.0 lb

## 2024-01-17 DIAGNOSIS — Z0000 Encounter for general adult medical examination without abnormal findings: Secondary | ICD-10-CM

## 2024-01-17 NOTE — Progress Notes (Signed)
 Subjective:   Sherry Mitchell is a 78 y.o. who presents for a Medicare Wellness preventive visit.  As a reminder, Annual Wellness Visits don't include a physical exam, and some assessments may be limited, especially if this visit is performed virtually. We may recommend an in-person follow-up visit with your provider if needed.  Visit Complete: Virtual I connected with  [name] on 01/17/24 by a audio enabled telemedicine application and verified that I am speaking with the correct person using two identifiers.  Patient Location: Home  Provider Location: Office/Clinic  I discussed the limitations of evaluation and management by telemedicine. The patient expressed understanding and agreed to proceed.  Vital Signs: Because this visit was a virtual/telehealth visit, some criteria may be missing or patient reported. Any vitals not documented were not able to be obtained and vitals that have been documented are patient reported.  VideoDeclined- This patient declined Librarian, academic. Therefore the visit was completed with audio only.  Persons Participating in Visit: Patient.  AWV Questionnaire: No: Patient Medicare AWV questionnaire was not completed prior to this visit.  Cardiac Risk Factors include: advanced age (>75men, >64 women);dyslipidemia;hypertension     Objective:    Today's Vitals   01/17/24 1004  Weight: 133 lb (60.3 kg)  Height: 5' 6 (1.676 m)   Body mass index is 21.47 kg/m.     01/17/2024   10:04 AM 01/10/2023    9:46 AM 06/17/2021    2:40 PM 01/02/2020   11:37 PM 12/05/2019    1:20 PM 11/29/2018    1:12 PM 11/24/2017   11:21 AM  Advanced Directives  Does Patient Have a Medical Advance Directive? Yes Yes Yes No Yes Yes Yes   Type of Estate Agent of [place];Living will Healthcare Power of [place];Living will   Healthcare Power of [place];Living will Healthcare Power of [place];Living will Healthcare  Power of [place];Living will  Does patient want to make changes to medical advance directive? No - Patient declined No - Patient declined No - Guardian declined  No - Patient declined    Copy of Healthcare Power of Attorney in Chart? Yes - validated most recent copy scanned in chart (See row information) Yes - validated most recent copy scanned in chart (See row information)    Yes - validated most recent copy scanned in chart (See row information) No - copy requested      Data saved with a previous flowsheet row definition    Current Medications (verified) Outpatient Encounter Medications as of 01/17/2024  Medication Sig   acetaminophen  (TYLENOL ) 650 MG CR tablet Take 650 mg by mouth every 8 (eight) hours as needed for pain.   alendronate  (FOSAMAX ) 70 MG tablet TAKE 1 TABLET BY MOUTH ONCE A WEEK .  TAKE  WITH  A  FULL  GLASS  OF  WATER  ON  AN  EMPTY  STOMACH   Cholecalciferol (VITAMIN D3) 50 MCG (2000 UT) TABS Take 2,000 Units by mouth daily.   diltiazem  (DILACOR XR ) 240 MG 24 hr capsule Take 1 capsule (240 mg total) by mouth daily.   latanoprost (XALATAN) 0.005 % ophthalmic solution SMARTSIG:In Eye(s)   lisinopril -hydrochlorothiazide  (ZESTORETIC ) 20-25 MG tablet Take 1 tablet by mouth daily.   traZODone  (DESYREL ) 50 MG tablet Take 0.5-1 tablets (25-50 mg total) by mouth at bedtime.   No facility-administered encounter medications on file as of 01/17/2024.    Allergies (verified) Patient has no known allergies.   History: Past Medical  History:  Diagnosis Date   Colon polyps 02/2003, 02/2009   Adenomatous polyps   Diverticulitis    Diverticulosis    Heart murmur    Hypertension    Osteoarthritis    hands   Osteopenia    DEXA 02/2011, 02/2013: -1.9   Pneumonia    Prediabetes    Past Surgical History:  Procedure Laterality Date   BREAST ENHANCEMENT SURGERY  1987   COLONOSCOPY     Family History  Problem Relation Age of Onset   Dementia Brother    Parkinson's disease  Brother    Cancer Mother        cancer of saliva glands   Heart disease Other    Heart attack Father    Rheumatic fever Father    Colon cancer Neg Hx    Esophageal cancer Neg Hx    Stomach cancer Neg Hx    Rectal cancer Neg Hx    Social History   Socioeconomic History   Marital status: Married    Spouse name: Richard   Number of children: 2   Years of education: Not on file   Highest education level: Not on file  Occupational History   Occupation: retired  Tobacco Use   Smoking status: Never   Smokeless tobacco: Never  Vaping Use   Vaping status: Never Used  Substance and Sexual Activity   Alcohol use: Yes    Alcohol/week: 1.0 standard drink of alcohol    Types: 1 Cans of beer per week    Comment: wine with dinner   Drug use: No   Sexual activity: Not Currently  Other Topics Concern   Not on file  Social History Narrative   Lives with her husband   Exercises regularly   Drinks 1 glass of wine with dinner   Social Drivers of Health   Financial Resource Strain: Low Risk  (01/17/2024)   Overall Financial Resource Strain (CARDIA)    Difficulty of Paying Living Expenses: Not hard at all  Food Insecurity: No Food Insecurity (01/17/2024)   Hunger Vital Sign    Worried About Running Out of Food in the Last Year: Never true    Ran Out of Food in the Last Year: Never true  Transportation Needs: No Transportation Needs (01/17/2024)   PRAPARE - Administrator, Civil Service (Medical): No    Lack of Transportation (Non-Medical): No  Physical Activity: Inactive (01/17/2024)   Exercise Vital Sign    Days of Exercise per Week: 0 days    Minutes of Exercise per Session: 0 min  Stress: Stress Concern Present (01/17/2024)   [employer] of Occupational Health - Occupational Stress Questionnaire    Feeling of Stress: To some extent  Social Connections: Moderately Isolated (01/17/2024)   Social Connection and Isolation Panel    Frequency of Communication with  Friends and Family: More than three times a week    Frequency of Social Gatherings with Friends and Family: Twice a week    Attends Religious Services: Never    Database Administrator or Organizations: No    Attends Engineer, Structural: Never    Marital Status: Married    Tobacco Counseling Counseling given: Not Answered    Clinical Intake:  Pre-visit preparation completed: Yes  Pain : No/denies pain     BMI - recorded: 21.47 Nutritional Status: BMI of 19-24  Normal Nutritional Risks: None Diabetes: No  Lab Results  Component Value Date   HGBA1C 6.1 05/19/2023  HGBA1C 6.1 04/22/2021   HGBA1C 6.2 04/22/2020     How often do you need to have someone help you when you read instructions, pamphlets, or other written materials from your doctor or pharmacy?: 1 - Never  Interpreter Needed?: No  Information entered by :: Verdie Saba, CMA   Activities of Daily Living     01/17/2024   10:07 AM  In your present state of health, do you have any difficulty performing the following activities:  Hearing? 0  Vision? 0  Difficulty concentrating or making decisions? 0  Walking or climbing stairs? 0  Dressing or bathing? 0  Doing errands, shopping? 0  Preparing Food and eating ? N  Using the Toilet? N  In the past six months, have you accidently leaked urine? N  Do you have problems with loss of bowel control? N  Managing your Medications? N  Managing your Finances? N  Housekeeping or managing your Housekeeping? N    Patient Care Team: Geofm Glade PARAS, MD as PCP - General (Internal Medicine) Aneita Gwendlyn DASEN, MD (Inactive) (Gastroenterology) Barbette Knock, MD (Obstetrics and Gynecology) Paul Barrio, [place] as Referring Physician (Optometry) Court Pulling, MD as Referring Physician (Dermatology) Paul Barrio, OD as Referring Physician (Optometry)  I have updated your Care Teams any recent Medical Services you may have received from other  providers in the past year.     Assessment:   This is a routine wellness examination for [place].  Hearing/Vision screen Hearing Screening - Comments:: Denies hearing difficulties   Vision Screening - Comments:: Denies vision concerns - Wears eyeglasses for reading - up to date with routine eye exams    Goals Addressed               This Visit's Progress     Patient Stated (pt-stated)        Patient stated she plans to manage arthritis in hands and knees       Depression Screen     01/17/2024   10:09 AM 01/10/2023    9:51 AM 04/26/2022    1:53 PM 04/26/2021    9:36 AM 12/05/2019    1:21 PM 04/13/2019   10:56 AM 11/29/2018    1:12 PM  PHQ 2/9 Scores  PHQ - 2 Score 0 3 0 0 0 0 0  PHQ- 9 Score 0 6 3 1        Fall Risk     01/17/2024   10:08 AM 01/10/2023    9:47 AM 04/26/2022    1:53 PM 04/24/2021    1:44 PM 12/05/2019    1:21 PM  Fall Risk   Falls in the past year? 0 0 0 0 0  Number falls in past yr: 0 0 0 0 0  Injury with Fall? 0 0 0 0 0  Risk for fall due to : No Fall Risks No Fall Risks No Fall Risks No Fall Risks No Fall Risks  Follow up Falls evaluation completed;Falls prevention discussed Falls prevention discussed;Falls evaluation completed Falls evaluation completed Falls evaluation completed  Falls evaluation completed      Data saved with a previous flowsheet row definition    MEDICARE RISK AT HOME:  Medicare Risk at Home Any stairs in or around the home?: Yes If so, are there any without handrails?: No Home free of loose throw rugs in walkways, pet beds, electrical cords, etc?: Yes Adequate lighting in your home to reduce risk of falls?: Yes Life alert?: No Use of a cane, walker  or w/c?: No Grab bars in the bathroom?: No Shower chair or bench in shower?: Yes Elevated toilet seat or a handicapped toilet?: Yes  TIMED UP AND GO:  Was the test performed?  No  Cognitive Function: 6CIT completed        01/17/2024   10:11 AM 01/10/2023    9:47 AM  6CIT  Screen  What Year? 0 points 0 points  What month? 0 points 0 points  What time? 0 points 0 points  Count back from 20 0 points 0 points  Months in reverse 0 points 0 points  Repeat phrase 0 points 0 points  Total Score 0 points 0 points    Immunizations Immunization History  Administered Date(s) Administered   Fluad Quad(high Dose 65+) 12/22/2018, 02/04/2023   INFLUENZA, HIGH DOSE SEASONAL PF 11/28/2012, 01/03/2015, 11/19/2016, 11/24/2017, 12/31/2021   Influenza Split 02/26/2011, 11/29/2011, 12/07/2013   Influenza-Unspecified 01/03/2015, 12/26/2015, 12/04/2019, 02/03/2021   Novavax(Covid-19) Vaccine 01/06/2023   PFIZER(Purple Top)SARS-COV-2 Vaccination 04/26/2019, 05/21/2019   Pneumococcal Conjugate-13 03/04/2014   Pneumococcal Polysaccharide-23 03/06/2015   Tdap 02/26/2011, 02/04/2023   Unspecified SARS-COV-2 Vaccination 01/06/2023   Zoster Recombinant(Shingrix) 01/09/2018, 03/10/2018    Screening Tests Health Maintenance  Topic Date Due   Influenza Vaccine  10/21/2023   COVID-19 Vaccine (4 - 2025-26 season) 11/21/2023   Medicare Annual Wellness (AWV)  01/16/2025   DEXA SCAN  06/26/2025   DTaP/Tdap/Td (3 - Td or Tdap) 02/03/2033   Pneumococcal Vaccine: 50+ Years  Completed   Hepatitis C Screening  Completed   Zoster Vaccines- Shingrix  Completed   Meningococcal B Vaccine  Aged Out   Mammogram  Discontinued   Colonoscopy  Discontinued    Health Maintenance Items Addressed:  01/17/2024  Additional Screening:  Vision Screening: Recommended annual ophthalmology exams for early detection of glaucoma and other disorders of the eye. Is the patient up to date with their annual eye exam?  Yes  Who is the provider or what is the name of the office in which the patient attends annual eye exams? Plans to schedule an appt w/a new Optometrist  Dental Screening: Recommended annual dental exams for proper oral hygiene  Community Resource Referral / Chronic Care Management: CRR  required this visit?  No   CCM required this visit?  No   Plan:    I have personally reviewed and noted the following in the patient's chart:   Medical and social history Use of alcohol, tobacco or illicit drugs  Current medications and supplements including opioid prescriptions. Patient is not currently taking opioid prescriptions. Functional ability and status Nutritional status Physical activity Advanced directives List of other physicians Hospitalizations, surgeries, and ER visits in previous 12 months Vitals Screenings to include cognitive, depression, and falls Referrals and appointments  In addition, I have reviewed and discussed with patient certain preventive protocols, quality metrics, and best practice recommendations. A written personalized care plan for preventive services as well as general preventive health recommendations were provided to patient.   Verdie CHRISTELLA Saba, CMA   01/17/2024   After Visit Summary: (MyChart) Due to this being a telephonic visit, the after visit summary with patients personalized plan was offered to patient via MyChart   Notes: Nothing significant to report at this time.

## 2024-01-17 NOTE — Patient Instructions (Addendum)
 [name],  Thank you for taking the time for your Medicare Wellness Visit. I appreciate your continued commitment to your health goals. Please review the care plan we discussed, and feel free to reach out if I can assist you further.  Medicare recommends these wellness visits once per year to help you and your care team stay ahead of potential health issues. These visits are designed to focus on prevention, allowing your provider to concentrate on managing your acute and chronic conditions during your regular appointments.  Please note that Annual Wellness Visits do not include a physical exam. Some assessments may be limited, especially if the visit was conducted virtually. If needed, we may recommend a separate in-person follow-up with your provider.  Ongoing Care Seeing your primary care provider every 3 to 6 months helps us  monitor your health and provide consistent, personalized care.   Referrals If a referral was made during today's visit and you haven't received any updates within two weeks, please contact the referred provider directly to check on the status.  Recommended Screenings:  Health Maintenance  Topic Date Due   Flu Shot  10/21/2023   COVID-19 Vaccine (4 - 2025-26 season) 11/21/2023   Medicare Annual Wellness Visit  01/16/2025   DEXA scan (bone density measurement)  06/26/2025   DTaP/Tdap/Td vaccine (3 - Td or Tdap) 02/03/2033   Pneumococcal Vaccine for age over 65  Completed   Hepatitis C Screening  Completed   Zoster (Shingles) Vaccine  Completed   Meningitis B Vaccine  Aged Out   Breast Cancer Screening  Discontinued   Colon Cancer Screening  Discontinued       01/17/2024   10:04 AM  Advanced Directives  Does Patient Have a Medical Advance Directive? Yes  Type of Estate Agent of [place];Living will  Does patient want to make changes to medical advance directive? No - Patient declined  Copy of Healthcare Power of Attorney in Chart?  Yes - validated most recent copy scanned in chart (See row information)   Advance Care Planning is important because it: Ensures you receive medical care that aligns with your values, goals, and preferences. Provides guidance to your family and loved ones, reducing the emotional burden of decision-making during critical moments.  Vision: Annual vision screenings are recommended for early detection of glaucoma, cataracts, and diabetic retinopathy. These exams can also reveal signs of chronic conditions such as diabetes and high blood pressure.  Dental: Annual dental screenings help detect early signs of oral cancer, gum disease, and other conditions linked to overall health, including heart disease and diabetes.

## 2024-02-22 DIAGNOSIS — H25812 Combined forms of age-related cataract, left eye: Secondary | ICD-10-CM | POA: Diagnosis not present

## 2024-02-22 DIAGNOSIS — H43813 Vitreous degeneration, bilateral: Secondary | ICD-10-CM | POA: Diagnosis not present

## 2024-02-22 DIAGNOSIS — H2511 Age-related nuclear cataract, right eye: Secondary | ICD-10-CM | POA: Diagnosis not present

## 2024-02-22 DIAGNOSIS — H40053 Ocular hypertension, bilateral: Secondary | ICD-10-CM | POA: Diagnosis not present

## 2024-02-22 DIAGNOSIS — D3132 Benign neoplasm of left choroid: Secondary | ICD-10-CM | POA: Diagnosis not present

## 2024-05-01 ENCOUNTER — Ambulatory Visit: Payer: PPO | Admitting: Internal Medicine

## 2025-01-17 ENCOUNTER — Ambulatory Visit
